# Patient Record
Sex: Male | Born: 1975 | Race: Black or African American | Hispanic: No | Marital: Married | Smoking: Former smoker
Health system: Southern US, Community
[De-identification: ages and names within clinical notes are randomized; demographics above are authoritative.]

## PROBLEM LIST (undated history)

## (undated) ENCOUNTER — Emergency Department (HOSPITAL_COMMUNITY): Admission: EM | Payer: 59 | Source: Home / Self Care

## (undated) DIAGNOSIS — Z8719 Personal history of other diseases of the digestive system: Secondary | ICD-10-CM

## (undated) DIAGNOSIS — I10 Essential (primary) hypertension: Secondary | ICD-10-CM

## (undated) DIAGNOSIS — R7303 Prediabetes: Secondary | ICD-10-CM

## (undated) DIAGNOSIS — R002 Palpitations: Secondary | ICD-10-CM

## (undated) DIAGNOSIS — K604 Rectal fistula, unspecified: Secondary | ICD-10-CM

## (undated) HISTORY — DX: Essential (primary) hypertension: I10

## (undated) HISTORY — PX: OTHER SURGICAL HISTORY: SHX169

## (undated) HISTORY — PX: CARDIAC CATHETERIZATION: SHX172

---

## 1998-07-03 ENCOUNTER — Emergency Department (HOSPITAL_COMMUNITY): Admission: EM | Admit: 1998-07-03 | Discharge: 1998-07-03 | Payer: Self-pay | Admitting: Emergency Medicine

## 2001-09-13 ENCOUNTER — Emergency Department (HOSPITAL_COMMUNITY): Admission: EM | Admit: 2001-09-13 | Discharge: 2001-09-13 | Payer: Self-pay | Admitting: Emergency Medicine

## 2002-01-11 ENCOUNTER — Ambulatory Visit (HOSPITAL_COMMUNITY): Admission: RE | Admit: 2002-01-11 | Discharge: 2002-01-11 | Payer: Self-pay | Admitting: Internal Medicine

## 2002-01-11 ENCOUNTER — Encounter: Payer: Self-pay | Admitting: Internal Medicine

## 2002-09-29 ENCOUNTER — Emergency Department (HOSPITAL_COMMUNITY): Admission: EM | Admit: 2002-09-29 | Discharge: 2002-09-30 | Payer: Self-pay | Admitting: Emergency Medicine

## 2002-09-29 ENCOUNTER — Encounter: Payer: Self-pay | Admitting: Emergency Medicine

## 2004-01-29 ENCOUNTER — Emergency Department (HOSPITAL_COMMUNITY): Admission: EM | Admit: 2004-01-29 | Discharge: 2004-01-29 | Payer: Self-pay | Admitting: Emergency Medicine

## 2004-04-16 ENCOUNTER — Emergency Department (HOSPITAL_COMMUNITY): Admission: EM | Admit: 2004-04-16 | Discharge: 2004-04-16 | Payer: Self-pay | Admitting: Emergency Medicine

## 2004-09-03 ENCOUNTER — Emergency Department (HOSPITAL_COMMUNITY): Admission: EM | Admit: 2004-09-03 | Discharge: 2004-09-03 | Payer: Self-pay | Admitting: Emergency Medicine

## 2006-01-16 ENCOUNTER — Emergency Department (HOSPITAL_COMMUNITY): Admission: EM | Admit: 2006-01-16 | Discharge: 2006-01-16 | Payer: Self-pay | Admitting: Emergency Medicine

## 2006-03-12 ENCOUNTER — Emergency Department (HOSPITAL_COMMUNITY): Admission: EM | Admit: 2006-03-12 | Discharge: 2006-03-12 | Payer: Self-pay | Admitting: Emergency Medicine

## 2006-04-04 ENCOUNTER — Emergency Department (HOSPITAL_COMMUNITY): Admission: EM | Admit: 2006-04-04 | Discharge: 2006-04-04 | Payer: Self-pay | Admitting: Emergency Medicine

## 2006-04-11 ENCOUNTER — Ambulatory Visit: Payer: Self-pay | Admitting: Orthopedic Surgery

## 2006-06-06 ENCOUNTER — Ambulatory Visit: Payer: Self-pay | Admitting: Orthopedic Surgery

## 2006-07-18 ENCOUNTER — Ambulatory Visit (HOSPITAL_COMMUNITY): Admission: RE | Admit: 2006-07-18 | Discharge: 2006-07-18 | Payer: Self-pay | Admitting: *Deleted

## 2006-07-20 ENCOUNTER — Ambulatory Visit (HOSPITAL_COMMUNITY): Admission: RE | Admit: 2006-07-20 | Discharge: 2006-07-20 | Payer: Self-pay | Admitting: *Deleted

## 2006-11-02 ENCOUNTER — Emergency Department (HOSPITAL_COMMUNITY): Admission: EM | Admit: 2006-11-02 | Discharge: 2006-11-02 | Payer: Self-pay | Admitting: Emergency Medicine

## 2007-10-05 ENCOUNTER — Emergency Department (HOSPITAL_COMMUNITY): Admission: EM | Admit: 2007-10-05 | Discharge: 2007-10-05 | Payer: Self-pay | Admitting: Emergency Medicine

## 2008-02-23 ENCOUNTER — Emergency Department (HOSPITAL_COMMUNITY): Admission: EM | Admit: 2008-02-23 | Discharge: 2008-02-23 | Payer: Self-pay | Admitting: Emergency Medicine

## 2009-01-31 ENCOUNTER — Emergency Department (HOSPITAL_COMMUNITY): Admission: EM | Admit: 2009-01-31 | Discharge: 2009-01-31 | Payer: Self-pay | Admitting: Emergency Medicine

## 2009-02-15 ENCOUNTER — Ambulatory Visit: Payer: Self-pay | Admitting: Family Medicine

## 2009-02-15 ENCOUNTER — Inpatient Hospital Stay (HOSPITAL_COMMUNITY): Admission: EM | Admit: 2009-02-15 | Discharge: 2009-02-16 | Payer: Self-pay | Admitting: Emergency Medicine

## 2009-02-16 ENCOUNTER — Ambulatory Visit: Payer: Self-pay | Admitting: Vascular Surgery

## 2009-02-16 ENCOUNTER — Encounter: Payer: Self-pay | Admitting: Family Medicine

## 2009-03-03 ENCOUNTER — Telehealth: Payer: Self-pay | Admitting: *Deleted

## 2009-03-10 ENCOUNTER — Encounter: Payer: Self-pay | Admitting: Family Medicine

## 2009-03-10 ENCOUNTER — Ambulatory Visit: Payer: Self-pay | Admitting: Family Medicine

## 2009-03-10 DIAGNOSIS — I10 Essential (primary) hypertension: Secondary | ICD-10-CM | POA: Insufficient documentation

## 2009-03-10 DIAGNOSIS — Z87891 Personal history of nicotine dependence: Secondary | ICD-10-CM | POA: Insufficient documentation

## 2009-03-21 ENCOUNTER — Encounter: Payer: Self-pay | Admitting: Family Medicine

## 2009-09-15 ENCOUNTER — Emergency Department (HOSPITAL_COMMUNITY): Admission: EM | Admit: 2009-09-15 | Discharge: 2009-09-15 | Payer: Self-pay | Admitting: Emergency Medicine

## 2009-10-09 ENCOUNTER — Telehealth: Payer: Self-pay | Admitting: *Deleted

## 2010-03-19 ENCOUNTER — Telehealth: Payer: Self-pay | Admitting: Family Medicine

## 2010-03-30 ENCOUNTER — Ambulatory Visit: Payer: Self-pay | Admitting: Family Medicine

## 2010-03-30 DIAGNOSIS — J301 Allergic rhinitis due to pollen: Secondary | ICD-10-CM | POA: Insufficient documentation

## 2010-04-08 ENCOUNTER — Emergency Department (HOSPITAL_COMMUNITY): Admission: EM | Admit: 2010-04-08 | Discharge: 2010-04-08 | Payer: Self-pay | Admitting: Emergency Medicine

## 2011-01-12 NOTE — Assessment & Plan Note (Signed)
Summary: allergies, HTN, stopped smoking!   Vital Signs:  Patient profile:   35 year old male Height:      72 inches Weight:      290.5 pounds BMI:     39.54 Temp:     98.2 degrees F oral Pulse rate:   66 / minute BP sitting:   145 / 86  (left arm) Cuff size:   regular  Vitals Entered By: Gladstone Pih (March 30, 2010 10:46 AM) CC: allergies, HTN, stopped smoking Is Patient Diabetic? No Pain Assessment Patient in pain? no        Primary Care Provider:  Jamie Brookes  CC:  allergies, HTN, and stopped smoking.  History of Present Illness: Allergies: Pt has been having a feeling of "being under water" with his hearing lately. He noticed it shortly after the pollen started to increase. He has a h/o sinus pressure the last 7 days and is wondering if this is b/c of allergies. He has a little cough and his throat seems to be draining. He is not currently taking anything for allergies.   Hypertension: PT has been doing well with his BP. He checks it at home occasionally and says the last time he checked it was 125/81 but that was about a month ago.  Smoking Cessation: Pt successfully quit smoking and has not restarted.   Habits & Providers  Alcohol-Tobacco-Diet     Tobacco Status: quit < 6 months     Tobacco Counseling: to remain off tobacco products  Current Medications (verified): 1)  Amlodipine Besylate 10 Mg Tabs (Amlodipine Besylate) .... Take 1 and 1/2 Tablets Daily 2)  Anacin 81 Mg Tbec (Aspirin) .... Take One Pill Daily 3)  Atenolol 50 Mg Tabs (Atenolol) .... Take One Pill Daily 4)  Trazodone Hcl 50 Mg Tabs (Trazodone Hcl) .... Take One Pill At Night 5)  Cetirizine Hcl 10 Mg Tabs (Cetirizine Hcl) .... Take 1 Pill Daily  Allergies (verified): No Known Drug Allergies  Review of Systems        vitals reviewed and pertinent negatives and positives seen in HPI   Physical Exam  General:  Well-developed,well-nourished,in no acute distress; alert,appropriate and  cooperative throughout examination Head:  Normocephalic and atraumatic without obvious abnormalities. No apparent alopecia or balding. Eyes:  No corneal or conjunctival inflammation noted. EOMI. Perrla. Funduscopic exam benign, without hemorrhages, exudates or papilledema. Vision grossly normal. Ears:  bilateral TM's bulging with clear fluid behind ears Nose:  nasal dischargemucosal pallor and mucosal edema.   Mouth:  pharyngeal erythema.   Neck:  No deformities, masses, or tenderness noted. Lungs:  Normal respiratory effort, chest expands symmetrically. Lungs are clear to auscultation, no crackles or wheezes. Heart:  Normal rate and regular rhythm. S1 and S2 normal without gallop, murmur, click, rub or other extra sounds.   Impression & Recommendations:  Problem # 1:  ALLERGIC RHINITIS DUE TO POLLEN (ICD-477.0) Assessment New PT started on Zyrtec. Given Rx today. Will likely improve his ear pressure, post nasal drip and cough.   Orders: [facility]- Est  Level 4 ([address])  Problem # 2:  ESSENTIAL HYPERTENSION, BENIGN (ICD-401.1) Assessment: Unchanged Pt is doing well with his hypertension. Will check a FLP in future. Encourged pt to cont to monitor.   His updated medication list for this problem includes:    Amlodipine Besylate 10 Mg Tabs (Amlodipine besylate) .Marland Kitchen... Take 1 and 1/2 tablets daily    Atenolol 50 Mg Tabs (Atenolol) .Marland Kitchen... Take one pill daily  Orders:  [facility]- Est  Level 4 (99214)Future Orders: Lipid-[facility] ([id number]) ... 03/18/2011 Comp Met-[facility] [phone]) ... 03/31/2011  Problem # 3:  TOBACCO ABUSE, HX OF (ICD-V15.82) Assessment: Improved PT has quit smoking. Encouraged to keep up the good work.   Orders: [facility]- Est  Level 4 (73710)  Complete Medication List: 1)  Amlodipine Besylate 10 Mg Tabs (Amlodipine besylate) .... Take 1 and 1/2 tablets daily 2)  Anacin 81 Mg Tbec (Aspirin) .... Take one pill daily 3)  Atenolol 50 Mg Tabs (Atenolol) .... Take one pill daily 4)   Trazodone Hcl 50 Mg Tabs (Trazodone hcl) .... Take one pill at night 5)  Cetirizine Hcl 10 Mg Tabs (Cetirizine hcl) .... Take 1 pill daily  Patient Instructions: 1)  Start generic Zyrtec for your allergies.  2)  You have flluid behind your hear drums.  3)  Try to increase activity to about 60 minutes a day.  4)  Increase fruits and vegetables.  5)  Good job on quitting smoking.  6)  Come back in 3 months to get fasting labs.  Prescriptions: CETIRIZINE HCL 10 MG TABS (CETIRIZINE HCL) take 1 pill daily  #96 x 3   Entered and Authorized by:   Jamie Brookes MD   Signed by:   Jamie Brookes MD on 03/30/2010   Method used:   Electronically to        [facility] [address] (retail)       [address]       [place], [place]  [id number]       Ph: [phone]       Fax: [phone]   RxID:   [id number] ATENOLOL 50 MG TABS (ATENOLOL) take one pill daily  #96 x 3   Entered and Authorized by:   Jamie Brookes MD   Signed by:   Jamie Brookes MD on 03/30/2010   Method used:   Electronically to        [facility] (retail)       [address]       [place], [place]  [address]       Ph: [phone]       Fax: [phone]   RxID:   [id number] AMLODIPINE BESYLATE 10 MG TABS (AMLODIPINE BESYLATE) take 1 and 1/2 tablets daily  #140 x 3   Entered and Authorized by:   Jamie Brookes MD   Signed by:   Jamie Brookes MD on 03/30/2010   Method used:   Electronically to        [facility] [facility] (retail)       [address]       [place], [place]  [address]       Ph: [phone]       Fax: [phone]   RxID:   [id number] ATENOLOL 50 MG TABS (ATENOLOL) take one pill daily  #96 x 3   Entered and Authorized by:   Jamie Brookes MD   Signed by:   Jamie Brookes MD on 03/30/2010   Method used:   Electronically to        [facility] [phone]* (retail)       [address]       [place], [place]  [address]       Ph:  [phone]       Fax: [phone]   RxID:   [id number]

## 2011-01-12 NOTE — Progress Notes (Signed)
Summary: Rx Req: Amlodipine to [facility] (not [facility])  Phone Note Refill Request Call back at [facility] Phone [phone] Message from:  Patient  Refills Requested: Medication #1:  AMLODIPINE BESYLATE 10 MG TABS take one pill daily You need to see your MD before any more refills can be sent. PT USES [facility].  PT HAS F/UP ON 03/30/10.  Initial call taken by: Clydell Hakim,  March 19, 2010 9:51 AM  Follow-up for Phone Call        to pcp Follow-up by: Golden Circle RN,  March 19, 2010 10:00 AM  Additional Follow-up for Phone Call Additional follow up Details #1::        sent refill to [facility]. Will send it again but to [facility] [address].  Additional Follow-up by: Jamie Brookes MD,  March 22, 2010 8:44 PM    Prescriptions: AMLODIPINE BESYLATE 10 MG TABS (AMLODIPINE BESYLATE) take one pill daily You need to see your MD before any more refills can be sent.  #34 x 0   Entered and Authorized by:   Jamie Brookes MD   Signed by:   Jamie Brookes MD on 03/22/2010   Method used:   Electronically to        [facility] (retail)       [address]       [place], [place]  [address]       Ph: [phone]       Fax: [phone]   RxID:   [id number]

## 2011-03-18 LAB — POCT CARDIAC MARKERS
CKMB, poc: 1.2 ng/mL (ref 1.0–8.0)
Myoglobin, poc: 82.8 ng/mL (ref 12–200)
Troponin i, poc: <0.05 ng/mL (ref 0.00–0.09)

## 2011-03-18 LAB — POCT I-STAT, CHEM 8
Chloride: 103 mEq/L (ref 96–112)
Creatinine, Ser: 0.9 mg/dL (ref 0.4–1.5)
Glucose, Bld: 114 mg/dL — ABNORMAL HIGH (ref 70–99)
HCT: 47 % (ref 39.0–52.0)
Hemoglobin: 16 g/dL (ref 13.0–17.0)
Potassium: 4.6 mEq/L (ref 3.5–5.1)
Sodium: 138 mEq/L (ref 135–145)

## 2011-03-25 LAB — DIFFERENTIAL
Eosinophils Absolute: 0.2 10*3/uL (ref 0.0–0.7)
Lymphocytes Relative: 22 % (ref 12–46)
Lymphs Abs: 1.7 10*3/uL (ref 0.7–4.0)
Monocytes Relative: 7 % (ref 3–12)

## 2011-03-25 LAB — COMPREHENSIVE METABOLIC PANEL
Alkaline Phosphatase: 59 U/L (ref 39–117)
BUN: 14 mg/dL (ref 6–23)
Calcium: 8.7 mg/dL (ref 8.4–10.5)
Chloride: 105 mEq/L (ref 96–112)
GFR calc Af Amer: >60 mL/min (ref >60)
Glucose, Bld: 117 mg/dL — ABNORMAL HIGH (ref 70–99)
Potassium: 3.2 mEq/L — ABNORMAL LOW (ref 3.5–5.1)
Total Bilirubin: 0.6 mg/dL (ref 0.3–1.2)

## 2011-03-25 LAB — HEMOGLOBIN A1C: Mean Plasma Glucose: 131 mg/dL

## 2011-03-25 LAB — POCT I-STAT, CHEM 8
Calcium, Ion: 1.14 mmol/L (ref 1.12–1.32)
Chloride: 104 mEq/L (ref 96–112)
Creatinine, Ser: 1.2 mg/dL (ref 0.4–1.5)
Glucose, Bld: 103 mg/dL — ABNORMAL HIGH (ref 70–99)
HCT: 47 % (ref 39.0–52.0)
Potassium: 4.3 mEq/L (ref 3.5–5.1)
Sodium: 139 mEq/L (ref 135–145)
TCO2: 29 mmol/L (ref 0–100)

## 2011-03-25 LAB — RAPID URINE DRUG SCREEN, HOSP PERFORMED
Barbiturates: NOT DETECTED
Opiates: NOT DETECTED
Tetrahydrocannabinol: NOT DETECTED

## 2011-03-25 LAB — LIPID PANEL
Cholesterol: 129 mg/dL (ref 0–200)
HDL: 54 mg/dL (ref >39)
LDL Cholesterol: 54 mg/dL (ref 0–99)
VLDL: 21 mg/dL (ref 0–40)

## 2011-03-25 LAB — CBC
RBC: 4.96 MIL/uL (ref 4.22–5.81)
WBC: 7.7 10*3/uL (ref 4.0–10.5)

## 2011-04-27 NOTE — Discharge Summary (Signed)
NAMEALAN, Martin Rose NO.:  [id number]   MEDICAL RECORD NO.:  [id number]          PATIENT TYPE:  INP   LOCATION:  4712                         FACILITY:  [facility]   PHYSICIAN:  Paula Compton, MD        DATE OF BIRTH:  [birth year]   DATE OF ADMISSION:  02/15/2009  DATE OF DISCHARGE:  02/16/2009                               DISCHARGE SUMMARY   PRIMARY CARE Willowdean Luhmann:  This patient was unassigned; however, he is  going to follow up with me for primary, Dr. Jamie Brookes, at [facility].   DISCHARGE DIAGNOSES:  1. Paresthesias.  2. Headaches.  3. Excess alcohol intake.  4. Tobacco abuse.  5. Insomnia.   Discharge medications for this patient include:  1. Aspirin 81 mg p.o. daily.  2. Atenolol 50 mg p.o. daily.  3. Amlodipine 10 mg p.o. daily.  4. Trazodone 50 mg p.o. nightly.   The amlodipine and trazodone are new prescriptions.  He was given 30  tablets of each.   Consults during this hospitalization were none.   IMAGING STUDIES DURING THIS HOSPITALIZATION:  The patient had a head CT  without contrast that was done on February 15, 2009, that showed no  intracranial hemorrhage or CT findings of large acute infarct.  The  patient also had a MRI/MRA of the plain head and neck.  The MRI of the  head showed no acute infarct, no intracranial mass or abnormal  enhancement, mild mucosal thickening of the ethmoid sinus air cells in  the maxillary sinuses, partial opacification of the mastoid air cell and  middle ears.  The patient had an MRA of the head that showed a left  tubal artery predominantly ends in a PICA.  The right PICA is not  visualized, diminutive size left AICA.  Otherwise, no medium or large  size vessel significant stenosis or occlusion.  The MRA of the neck  showed no evidence of hemodynamically significant stenosis involving  either carotid bifurcation.  His left vertebral artery may originate  from the aortic arch, minimal narrowing of  the proximal right vertebral  artery.  The patient also had an MR of the C-spine without contrast that  showed scattered mild degenerative changes superimposed on baseline  slightly narrowed spinal canal with findings most predominant at the C3-  C4 level as described.  The patient has mild bulging and mild spinal  stenosis at multiple levels predominately the C3-C4 level.  There is  some minimal foraminal narrowing, and at T2-T3 there is a tiny left  posterolateral foraminal protrusion.  The patient did have an  echocardiogram with the final result pending.  This will be something  that the PCP will need to follow up on as well as the final carotid  Doppler results that primarily showed no stenosis and the vertebral  arteries going anteriorly.  These were both done on February 16, 2009, just  prior to the patient's discharge.  Significant lab results include a  mildly elevated glucose on i-STAT Chem-8.  Right before admission on  February 15, 2009, his glucose of  103.  The patient also had a CBC with  differential that was completely normal.  He had an echo alcohol level  that was less than 5.  He had an urine drug screen that was negative and  a complete metabolic panel that did show some low potassium at 3.2, a  glucose of 117, and albumin of 3.4.  The patient's potassium was  repleted during this hospitalization.  He had a lipid profile done on  February 16, 2009, that showed a cholesterol of 129, triglycerides 104, HDL  54, and LDL 54.  The patient also had ESR that was 0.  He had TSH level  that was 5.434 showing that his TSH was mildly elevated.  This will be  something that his PCP will need to follow up on.  The patient also had  a hemoglobin A1c that was done and was shown to be mildly elevated at  6.2.  this is considered prediabetes stat according to the newest  guidelines and this will be something that the PCP will need to talk to  the patient about as well.   BRIEF HOSPITAL COURSE:   This is a 35 year old male with a history of  hypertension, obesity, and probable obstructive sleep apnea who  presented to the ED with an episode of weakness and tingling in his left  leg, his left arm, and his left hand.  The patient was evaluated in the  ED, and a workup was begun to rule out transient ischemic attack or CVA.  The patient did complete an EKG which was normal.  He did have a CT scan  that showed no acute hemorrhage, but could not rule out small vessel  infarct, so the patient had an MRI/MRA done of the carotids and C-spine  and the brain and the results are as noted above.  The patient also had  a 2-D echo as well as carotid Dopplers to continue to rule out the  patient.  1. The first and most significant issue was the numbness and tingling      of the left arm and leg and paresthesias.  They are an unusual      atypical presentation for TIA or CVA, but the patient has some risk      factors including obesity, smoking, hypertension, family history,      and male sex, so the patient was ruled out with multiple tests.  He      was found to be negative for signs of TIA or stroke on each test.      The only thing that did prevent was compression and some bulging of      his C-spine which could be causing nerve compression peripherally      or radiculopathy.  The patient did mention that when he sits on his      wallet he does also have more numbness and tingling in his lower      leg consistent with nerve impingement.  Since the patient does have      hypertension, he was not treated aggressively until his CVA was      ruled out.  The patient did continue to have a little bit of high      blood pressures up to 160s during this hospitalization, so his      Norvasc was increased from 5 mg p.o. daily to 10 mg p.o. daily and      his atenolol was less than 50 mg p.o. daily.  The patient's blood      pressure will need to be monitored on subsequent visit with his      primary  care physician.  The patient's cholesterol was low and not      concerning at this time.  2. Headaches.  The patient has daily headaches and this is likely due      to obstructive sleep apnea.  The patient has tried to have a sleep      study twice in the past, but was unable to fall asleep, so the      sleep study has never been able to show the conclusive cause of his      insomnia and headaches.  The headaches are likely due to the      patient drinking alcohol nightly in order to sleep.  He does      mention that he drinks beer and 2-3 shots of alcohol at night in      order to sleep.  The patient was treated with Ambien while he was      in the hospital.  He was sent home with a prescription for      trazodone for sleep.  This would be evaluated at the next visit and      possibly a subsequent visit sleep study can be set up.  3. Alcohol intake.  The patient has excessive alcohol intake, but his      alcohol level during this hospitalization was less than 5.  We      treated his insomnia with Ambien, and the patient did not have any      withdrawal or delirium tremens during this hospitalization.  4. Smoking.  The patient does smoke cigarettes.  He is motivated to      quit.  He did have a smoking cessation consult while he was in the      hospital.  This will be something that we will follow up as an      outpatient.  5. Insomnia.  The patient does have a history of insomnia, and the      patient was treated with Ambien during the hospitalization and sent      home with a prescription for trazodone as well as detailed      information about good sleep hygiene including not watching      television 2 hours before bed, using caramel tea, using melatonin      as well as using the day room only for sleep and not to watch TV or      do other activities.   Issues to be followed up that his next appointment are blood pressure  checking, checking on his smoking cessation, as well as  following up on  his hemoglobin A1c that was in the prediabetic range at 6.1, following  up on his TSH that  was mildly elevated as well as following up on the results from his echo  and Doppler that were done in the hospital prior to discharge.  The  patient will call the clinic and make an appointment to follow up with  Dr. Clotilde Dieter in 2-4 weeks' time.  The patient was sent home in stable  medical condition.      Jamie Brookes, MD  Electronically Signed      Paula Compton, MD  Electronically Signed    AS/MEDQ  D:  02/16/2009  T:  02/17/2009  Job:  [id number]

## 2011-04-27 NOTE — H&P (Signed)
Martin Rose, Martin Rose NO.:  [id number]   MEDICAL RECORD NO.:  [id number]          PATIENT TYPE:  INP   LOCATION:  4712                         FACILITY:  [facility]   PHYSICIAN:  Milinda Antis, MD     DATE OF BIRTH:  January 19, 1976   DATE OF ADMISSION:  02/15/2009  DATE OF DISCHARGE:                              HISTORY & PHYSICAL   PRIMARY CARE Raniah Karan:  Unassigned.   PRIMARY CARDIOLOGIST:  Dani Gobble, MD, [facility].   CHIEF COMPLAINT:  Tingling in arm and leg.   HISTORY OF PRESENT ILLNESS:  A 35 year old male with history of  hypertension, obesity, probable OSA, presents with episode of weakness  and tingling in left arm and leg.  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  He states after lunch around  3:15 p.m. became lightheaded proceeded with driving route and developed  tingling, which started worse in left foot and toes and then in left  arm.  Left arm tingling turned into numbness, both lasting approximately  2 hours and the patient arrived back at furniture store.  Upon exiting  truck, the patient stated he felt generalized weakness and jitteriness,  but tingling had resolved.  Episode not associated with nausea,  vomiting, loss of consciousness, trauma, chest pain, shortness of  breath, injury, or alcohol intake.  Per the patient, he has had episodes  of tingling before in lower extremities, which he contributes to driving  long hours and sitting on his wallet.  The patient also admits to daily  headaches and occasional palpitation.  They are not directly associated  with paresthesia episodes.   PAST MEDICAL HISTORY:  1. Hypertension.  2. Cardiac catheterization in 2007, EF of 60%, no vessel disease.   PAST SURGICAL HISTORY:  Cardiac cath in 2007.   ALLERGIES:  No known drug allergies.   MEDICATIONS:  1. Atenolol 50 mg p.o. daily.  2. Norvasc 5 mg p.o. daily.   FAMILY HISTORY:  Mother with CVA at age  75 and hypertension.  Grandmother had breast cancer, colon cancer in family.   SOCIAL HISTORY:  Positive for tobacco, 1-2 cigarettes per day for  greater than 10 years; illicit, denies; EtOH, 2-3 beers and a couple of  shots every night prior to bedtime; lives with partner; 3 children; and  works for [employer] as a Naval architect.   REVIEW OF SYSTEMS:  No abdominal pain.  Recent bronchitis, treated in  ER, insomnia positive, and daily headaches.  No nausea or vomiting.  No  recent injury or trauma.  Positive palpitations and fluttering which  occur randomly during the day.  All other per HPI.   PHYSICAL EXAMINATION:  VITAL SIGNS:  Temperature 98.8, heart rate 80,  respiratory rate 16-20, blood pressure 179/114 range, systolic 154-179  over diastolic 100-114, and O2 sat 100% on room air.  GENERAL:  No acute distress, alert, and oriented x3.  Crying after exam  during discussion of admission.  HEENT:  PERRL, EOMI, nonicteric.  Oropharynx is clear.  Moist mucous  membranes.  NECK:  Supple.  No lymphadenopathy.  Nontender.  Normal range of motion.  CVS:  Regular rate and rhythm.  No murmurs.  RESPIRATORY:  CTAP.  ABDOMEN:  Positive bowel signs.  Normoactive, nontender, and  nondistended.  No masses.  Obese.  EXTREMITIES:  No edema.  Pulses are 2+.  NEUROLOGIC:  Cranial nerves II through XII grossly intact.  Motor 5/5  bilaterally.  Sensation is grossly intact.  Gait is normal.  No pronator  drift.  No clonus.  Reflexes are equal bilaterally.  GCS 15.  Speech  normal, answers appropriately, recall normal.   LABORATORY DATA:  WBC 7.7, hemoglobin 14.3, hematocrit 41.5, and  platelets 253.  Differential within normal limits.  I-STAT; sodium 139,  potassium 4.3, chloride 104, BUN 18, creatinine 1.2, glucose 110,  hemoglobin on I-STAT 16, hematocrit 47, and pCO2 29.   IMAGING:  CT of head without contrast, impression:  No intracranial  hemorrhage or CT findings of large acute infarct.   Small acute infarct  cannot be excluded by CT.  EKG:  Normal sinus rhythm; T-wave  abnormality, diffusely; inverted T-waves, normal compared to April 2007.   ASSESSMENT AND PLAN:  A 35 year old male with recent paresthesia in  upper and lower extremity and generalized weakness.  1. Paresthesia.  The patient is with history of lower extremity      tingling, however, with both upper extremity numbness, headache,      and lightheadedness as well as risk factors, concerned for      cerebrovascular event.  CT scan shows no evidence of acute bleed or      large infarct, however, cannot rule out small infarct.  We will      obtain MRI/MRA of brain and carotids and extend to C-spine to      evaluate for infarct, occlusion of large vessels, or any spinal      nerve impingement.  We will obtain 2-D echo to evaluate for thrombi      or abnormal wall motion movement.  We will admit to telemetry with      q.4 h. neuro checks to observe for any arrhythmia or change in      neurological exam.  We will risk stratify with history of      hypertension, obesity, and family history of CVA with lipid panel      and hemoglobin A1c.  We will start low-dose aspirin to follow TIA      protocol, hold on anticoagulation for 24 hours.  The patient with      elevated blood pressures into 170 systolic; however, will allow for      permissive hypertension until imaging clear.  Labetalol will be      used p.r.n. blood pressure greater than 220/120.  Other      differentials with lower extremity tingling include sciatica or      nerve impingement with long hours of furniture moving.      Differentials for daily headaches include OSA or effects of daily      EtOH use.  2. Hypertension.  Elevated blood pressure, however, rule out for      permissive hypertension until images rule out acute infarct.  We      will start home meds in a.m. and titrate as needed.  3. Ethyl alcohol dependence.  We will obtain UDS and EtOH  level,      monitor for signs of withdrawal.  Per the patient, he drinks      nightly secondary to  insomnia.  4. Tobacco dependence, we will counsel smoking cessation.  5. Insomnia.  We will give Ambien p.r.n.  6. Prophylaxis.  Protonix.  Hold anticoagulation for first 24 hours.  7. Disposition.  Pending TIA workup.  We will need primary care      Murrel Freet at discharge.      Milinda Antis, MD  Electronically Signed     KD/MEDQ  D:  02/16/2009  T:  02/16/2009  Job:  [id number]

## 2011-04-30 NOTE — Cardiovascular Report (Signed)
NAMEMarland Kitchen  Martin Rose, Martin Rose NO.:  [id number]   MEDICAL RECORD NO.:  [id number]          PATIENT TYPE:  OIB   LOCATION:  2899                         FACILITY:  [facility]   PHYSICIAN:  Darlin Priestly, MD  DATE OF BIRTH:  Apr 24, 1976   DATE OF PROCEDURE:  07/20/2006  DATE OF DISCHARGE:                              CARDIAC CATHETERIZATION   INDICATIONS FOR PROCEDURE:  [name] is a 35 year old male patient of  Dr. Kem Boroughs and Dr. Laverle Hobby, [facility], with a history of hypertension. He did undergo a 2-D echocardiogram  revealing LVH with hyperdynamic LV with mild MR. He also underwent a  Cardiolite scan suggesting anterior wall ischemia with a normal EF. Because  of his abnormal Cardiolite, he is now referred for cardiac catheterization  to rule out significant CAD.   DESCRIPTION OF PROCEDURE:  After getting informed consent, the patient was  brought to the cardiac cath lab, right and left groin shaved, prepped and  draped in the usual sterile fashion. ECG monitor established. Using modified  Seldinger technique, a 6 French arterial sheath was inserted in the right  femoral artery. A 6 French diagnostic catheter was used to perform  diagnostic angiography.   The left main is a large vessel with no evidence of disease.   The LAD is a large vessel that courses to the apex of three diagonal  branches. The LAD has no evidence of disease.   The first diagonal was a medium size vessel which bifurcates distally with  no evidence of disease.   The second and third diagonals were small vessels with no evidence  disease.   The left circumflex was a medium size vessel with gave rise to one obtuse  marginal branch. There is no evidence of disease in the AV circumflex.   The first OM-1 was a medium size vessel with no evidence of disease.   The coronary artery was a large vessel which is dominant to the PDA and  posterolateral branch.  There is no evidence of disease in the RCA, PDA or  posterolateral branch.   Left ventriculogram reveals a preserved EF of 60%.   Abdominal aortogram reveals no evidence of significant renal artery  stenosis.   HEMODYNAMICS:  Systemic aortic pressure 138/93, LV systemic pressure 139/2,  LVEDP of 22.   CONCLUSION:  1. No significant coronary artery disease.  2. Normal LV systolic function.  3. No evidence of renal artery stenosis.  4. Systemic hypertension.  5. Elevated LVEDP.      Darlin Priestly, MD  Electronically Signed     RHM/MEDQ  D:  07/20/2006  T:  07/20/2006  Job:  [id number]   cc:   Dr. Rickard Rhymes, M.D.

## 2011-06-15 ENCOUNTER — Emergency Department (HOSPITAL_COMMUNITY)
Admission: EM | Admit: 2011-06-15 | Discharge: 2011-06-15 | Disposition: A | Discharge status: Home / Self Care | Payer: Self-pay | Attending: Emergency Medicine | Admitting: Emergency Medicine

## 2011-06-15 DIAGNOSIS — I10 Essential (primary) hypertension: Secondary | ICD-10-CM | POA: Insufficient documentation

## 2011-06-15 DIAGNOSIS — Z79899 Other long term (current) drug therapy: Secondary | ICD-10-CM | POA: Insufficient documentation

## 2011-06-15 DIAGNOSIS — M79609 Pain in unspecified limb: Secondary | ICD-10-CM | POA: Insufficient documentation

## 2011-06-15 DIAGNOSIS — L03019 Cellulitis of unspecified finger: Secondary | ICD-10-CM | POA: Insufficient documentation

## 2011-06-17 ENCOUNTER — Other Ambulatory Visit: Payer: Self-pay | Admitting: Family Medicine

## 2011-06-23 ENCOUNTER — Ambulatory Visit (INDEPENDENT_AMBULATORY_CARE_PROVIDER_SITE_OTHER): Payer: Self-pay | Admitting: Family Medicine

## 2011-06-23 VITALS — BP 137/86 | HR 60 | Temp 98.0°F | Wt 290.0 lb

## 2011-06-23 DIAGNOSIS — I10 Essential (primary) hypertension: Secondary | ICD-10-CM

## 2011-06-23 DIAGNOSIS — IMO0002 Reserved for concepts with insufficient information to code with codable children: Secondary | ICD-10-CM

## 2011-06-23 LAB — COMPREHENSIVE METABOLIC PANEL
ALT: 33 U/L (ref 0–53)
AST: 24 U/L (ref 0–37)
Albumin: 4.1 g/dL (ref 3.5–5.2)
Alkaline Phosphatase: 87 U/L (ref 39–117)
BUN: 14 mg/dL (ref 6–23)
Potassium: 4.2 mEq/L (ref 3.5–5.3)
Sodium: 138 mEq/L (ref 135–145)
Total Protein: 6.8 g/dL (ref 6.0–8.3)

## 2011-06-23 MED ORDER — AMLODIPINE BESYLATE 10 MG PO TABS: ORAL_TABLET | ORAL | Status: DC

## 2011-06-23 MED ORDER — ATENOLOL 50 MG PO TABS: 50.0000 mg | ORAL_TABLET | Freq: Every day | ORAL | Status: DC

## 2011-06-23 NOTE — Patient Instructions (Signed)
Paronychia (Felon, Whitlow) Paronychia is an inflammatory reaction involving the folds of the skin surrounding the fingernail. This is commonly caused by an infection in the skin around a nail. The most common cause of paronychia is frequent wetting of the hands (as seen with bartenders, food servers, nurses or others who wet their hands). This makes the skin around the fingernail susceptible to infection by bacteria (germs) or fungus. Other predisposing factors are:  Aggressive manicuring.   Nail biting.  Thumbsucking. Alcohol and Nutrition Nutrition serves two purposes. It provides energy. It also maintains body structure and function. Food supplies energy. It also provides the building blocks needed to replace worn or damaged cells. Alcoholics often eat poorly. This limits their supply of essential nutrients. This affects energy supply and structure maintenance. Alcohol also affects the body's nutrients in:  Digestion.   Storage.   Using and getting rid of waste products.  IMPAIRMENT OF NUTRIENT DIGESTION AND UTILIZATION   Once ingested, food must be broken down into small components (digested). Then it is available for energy. It helps maintain body structure and function. Digestion begins in the mouth. It continues in the stomach and intestines, with help from the pancreas. The nutrients from digested food are absorbed from the intestines into the blood. Then they are carried to the liver. The liver prepares nutrients for:   Immediate use.   Storage and future use.   Alcohol inhibits the breakdown of nutrients into usable molecules.   It decreases secretion of digestive enzymes from the pancreas.   Alcohol impairs nutrient absorption by damaging the cells lining the stomach and intestines.   It also interferes with moving some nutrients into the blood.   In addition, nutritional deficiencies themselves may lead to further absorption problems.   For example, folate deficiency  changes the cells that line the small intestine. This impairs how water is absorbed. It also affects absorbed nutrients. These include glucose, sodium, and additional folate.   Even if nutrients are digested and absorbed, alcohol can prevent them from being fully used. It changes their transport, storage, and excretion. Impaired utilization of nutrients by alcoholics is indicated by:   Decreased liver stores of vitamins such as vitamin A   Increased excretion of nutrients such as fat.  ALCOHOL AND ENERGY SUPPLY   Three basic nutritional components found in food are:   Carbohydrates.   Proteins.   Fats.   These are used as energy. Some alcoholics take in as much as 50 % of their total daily calories from alcohol. They often neglect important foods.   Even when enough food is eaten, alcohol can impair the ways the body controls blood sugar (glucose) levels. It may either increase or decrease blood sugar.   In non-diabetic alcoholics, increased blood sugar (hyperglycemia) is caused by poor insulin secretion. It is usually temporary.   Decreased blood sugar (hypoglycemia) can cause serious injury even if this condition is short lived. Low blood sugar can happen when a fasting or malnourished person drinks alcohol. When there is no food to supply energy, stored sugar is used up. The products of alcohol inhibit forming glucose from other compounds such as amino acids. As a result, alcohol causes the brain and other body tissue to lack glucose. It is needed for energy and function.   Alcohol is an energy source. But how the body processes and uses the energy from alcohol is complex. Also, when alcohol is substituted for carbohydrates, subjects tend to lose weight. This indicates that  they get less energy from alcohol than from food.  ALCOHOL - MAINTAINING CELL STRUCTURE AND FUNCTION  Structure  Cells are made mostly of protein. So an adequate protein diet is important for maintaining cell  structure. This is especially true if cells are being damaged. Research indicates that alcohol affects protein nutrition by causing impaired:  Digestion of proteins to amino acids.   Processing of amino acids by the small intestine and liver.   Synthesis of proteins from amino acids.  Protein secretion by the liver.   Function  Nutrients are essential for the body to function well. They provide the tools that the body needs to work well:   Proteins.   Vitamins.   Minerals.  Alcohol can disrupt body function. It may cause nutrient deficiencies. And it may interfere with the way nutrients are processed. Vitamins   Vitamins are essential to maintain growth and normal metabolism. They regulate many of the body`s processes. Chronic heavy drinking causes deficiencies in many vitamins. This is caused by eating less. And, in some cases, vitamins may be poorly absorbed. For example, alcohol inhibits fat absorption. It impairs how the vitamins A, E, and D are normally absorbed along with dietary fats. Not enough vitamin A may cause night blindness. Not enough vitamin D may cause softening of the bones.   Some alcoholics lack vitamins A, C, D, E, K, and the B vitamins. These are all involved in wound healing and cell maintenance. In particular, because vitamin K is necessary for blood clotting, lacking that vitamin can cause delayed clotting. The result is excess bleeding. Lacking other vitamins involved in brain function may cause severe neurological damage.  Minerals  Deficiencies of minerals such as calcium, magnesium, iron, and zinc are common in alcoholics. The alcohol itself does not seem to affect how these minerals are absorbed. Rather, they seem to occur secondary to other alcohol-related problems, such as:  Less calcium absorbed.   Not enough magnesium.   More urinary excretion.   Vomiting.   Diarrhea.   Not enough iron due to gastrointestinal bleeding.   Not enough zinc or losses  related to other nutrient deficiencies.   Mineral deficiencies can cause a variety of medical consequences. These range from calcium-related bone disease to zinc-related night blindness and skin lesions.  ALCOHOL, MALNUTRITION, AND MEDICAL COMPLICATIONS  Liver Disease   Alcoholic liver damage is caused primarily by alcohol itself. But poor nutrition may increase the risk of alcohol-related liver damage. For example, nutrients normally found in the liver are known to be affected by drinking alcohol. These include carotenoids, which are the major sources of vitamin A, and vitamin E compounds. Decreases in such nutrients may play some role in alcohol-related liver damage.  Pancreatitis   Research suggests that malnutrition may increase the risk of developing alcoholic pancreatitis. Research suggests that a diet lacking in protein may increase alcohol's damaging effect on the pancreas.  Brain   Nutritional deficiencies may have severe effects on brain function. These may be permanent Specifically, thiamine deficiencies are often seen in alcoholics. They can cause severe neurological problems. These include:   Impaired movement.   Memory loss seen in Wernicke/Korsakoff syndrome.  Pregnancy   Alcohol has toxic effects on fetal development. It causes alcohol-related birth defects. They include fetal alcohol syndrome. Alcohol itself is toxic to the fetus. Also, the nutritional deficiency can affect how the fetus develops. That may compound the risk of developmental damage.   Nutritional needs during pregnancy are 10 to 30 %  greater than normal. Food intake can increase by as much as 140 % to cover the needs of both mother and fetus. An alcoholic mother`s nutritional problems may adversely affect the nutrition of the fetus. And alcohol itself can also restrict nutrition flow to the fetus.  NUTRITIONAL STATUS OF ALCOHOLICS  Techniques for assessing nutritional status include:  Taking body measurements  to estimate fat reserves. They include:   Weight.  Height.   Mass.  Skin fold thickness.   Performing blood analysis to provide measurements of circulating:  Proteins.   Vitamins.   Minerals.   These techniques tend to be imprecise. For many nutrients, there is no clear "cut-off" point that would allow an accurate definition of deficiency. So assessing the nutritional status of alcoholics is limited by these techniques. Dietary status may provide information about the risk of developing nutritional problems. Dietary status is assessed by:   Taking patients' dietary histories.   Evaluating the amount and types of food they are eating.   It is difficult to determine what exact amount of alcohol begins to have damaging effects on nutrition. In general, moderate drinkers have two drinks or less per day. They seem to be at little risk for nutritional problems. Various medical disorders begin to appear at greater levels.   Research indicates that the majority of even the heaviest drinkers have few obvious nutritional deficiencies. Many alcoholics who are hospitalized for medical complications of their disease do have severe malnutrition. Alcoholics tend to eat poorly. Often they eat less than the amounts of food necessary to provide enough:   Carbohydrates.  Protein.   Fat.  Vitamins A and C.   B vitamins.  Minerals like calcium and iron.   Of major concern is alcohol's effect on digesting food and use of nutrients. It may shift a mildly malnourished person toward severe malnutrition. Document Released: 09/23/2005 Document Re-Released: 05/19/2010  [facility] Patient Information 2011 [place], [place]. The most common cause is a staphylococcal (a type of germ) infection, or a fungal (Candida) infection. When caused by a germ, it usually comes on suddenly with redness, swelling, pus and is often painful. It may get under the nail and form an abscess (collection of pus), or form an abscess  around the nail. If the nail itself is infected with a fungus, the treatment is usually prolonged and may require oral medicine for up to one year. Your caregiver will determine the length of time treatment is required. The paronychia caused by bacteria (germs) may largely be avoided by not pulling on hangnails or picking at cuticles. When the infection occurs at the tips of the finger it is called felon. When the cause of paronychia is from the herpes simplex virus (HSV) it is called herpetic whitlow. TREATMENT  When an abscess is present treatment is often incision and drainage. This means that the abscess must be cut open so the pus can get out. When this is done, the following home care instructions should be followed.  HOME CARE INSTRUCTIONS  It is important to keep the affected fingers very dry. Rubber or plastic gloves over cotton gloves should be used whenever the hand must be placed in water.   Keep wound clean, dry and dressed as suggested by your caregiver between warm soaks or warm compresses.   Soak in warm water for fifteen to twenty minutes three to four times per day for bacterial infections. Fungal infections are very difficult to treat, so often require treatment for long periods of time.  For bacterial (germ) infections take antibiotics (medicine which kill germs) as directed and finish the prescription, even if the problem appears to be solved before the medicine is gone.   Only take over-the-counter or prescription medicines for pain, discomfort, or fever as directed by your caregiver.  SEEK IMMEDIATE MEDICAL CARE IF :  There is redness, swelling, or increasing pain in the wound.   Pus is coming from wound.   An unexplained oral temperature above 100.5 develops.   You notice a foul smell coming from the wound or dressing.  Document Released: 05/25/2001 Document Re-Released: 02/25/2009 [facility] Patient Information 2011 [place], [place].

## 2011-06-23 NOTE — Progress Notes (Signed)
  Subjective:    Patient ID: Martin Rose, male    DOB: 05-07-1976, 35 y.o.   MRN: [id number]  HPI Micah Flesher to hospital 7/3 for infected nail. Had nail removed and placed on abx x 2 days. Nail swelling and drainage much improved since removal. No fevers, hand swelling. Pt is here for follow up.   HTN: Pt reports hx/o elevated BPs in the past. Noted to be in 160s previously. Pt was placed on norvasc and atenolol in the past. Pt has been out of medications for last 3 months. Pt reports HA assd w/ Bps >160. Otherwise no CP, SOB. Has also been trying to decrease salt intake.    Review of Systems See HPI, otherwise 12 point ROS negative.     Objective:   Physical Exam Gen: up in chair, NAD, obese HEENT: NCAT, EOMI, TMs clear bilaterally CV: RRR, no murmurs auscultated PULM: CTAB, no wheezes, rales, rhoncii ABD: S/NT/+ bowel sounds  EXT: 2+ peripheral pulses         Assessment & Plan:

## 2011-06-24 ENCOUNTER — Encounter: Payer: Self-pay | Admitting: Family Medicine

## 2011-06-27 DIAGNOSIS — IMO0002 Reserved for concepts with insufficient information to code with codable children: Secondary | ICD-10-CM | POA: Insufficient documentation

## 2011-06-27 NOTE — Assessment & Plan Note (Signed)
Will continue with current regimen. Will check Cr and K.

## 2011-06-27 NOTE — Assessment & Plan Note (Signed)
Clinically resloved s/p abx.

## 2011-08-19 ENCOUNTER — Ambulatory Visit: Payer: Self-pay | Admitting: Family Medicine

## 2011-09-22 LAB — URINALYSIS, ROUTINE W REFLEX MICROSCOPIC
Glucose, UA: NEGATIVE
Hgb urine dipstick: NEGATIVE
Protein, ur: NEGATIVE

## 2011-11-23 ENCOUNTER — Ambulatory Visit: Payer: Self-pay | Admitting: Family Medicine

## 2011-11-24 ENCOUNTER — Ambulatory Visit (INDEPENDENT_AMBULATORY_CARE_PROVIDER_SITE_OTHER): Payer: Self-pay | Admitting: Family Medicine

## 2011-11-24 ENCOUNTER — Telehealth: Payer: Self-pay | Admitting: *Deleted

## 2011-11-24 ENCOUNTER — Encounter: Payer: Self-pay | Admitting: Family Medicine

## 2011-11-24 VITALS — BP 130/85 | HR 62 | Temp 97.8°F | Ht 72.0 in | Wt 303.0 lb

## 2011-11-24 DIAGNOSIS — R600 Localized edema: Secondary | ICD-10-CM | POA: Insufficient documentation

## 2011-11-24 DIAGNOSIS — R609 Edema, unspecified: Secondary | ICD-10-CM

## 2011-11-24 DIAGNOSIS — Z23 Encounter for immunization: Secondary | ICD-10-CM

## 2011-11-24 DIAGNOSIS — I10 Essential (primary) hypertension: Secondary | ICD-10-CM

## 2011-11-24 LAB — POCT URINALYSIS DIPSTICK
Bilirubin, UA: NEGATIVE
Ketones, UA: NEGATIVE
Leukocytes, UA: NEGATIVE
pH, UA: 6

## 2011-11-24 NOTE — Progress Notes (Signed)
  Subjective:    Patient ID: Martin Rose, male    DOB: 1975/12/28, 35 y.o.   MRN: [id number]  HPI Lower extremity edema: Patient reports lower extremity edema bilateral x3 weeks.  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 Swelling in extremities has never been a problem prior to the past 3 weeks. Takes Norvasc and propranolol for blood pressure. Takes as directed. Has never had swelling on Norvasc and has taken Norvasc x5 years. Was out of medications x3 months earlier this year but other than that has taken medications consistently. Patient does endorse significant alcohol use. Has one to 2 beers per night in 1-2 shots of vodka per night. Drinks approximately a pint  per weekend.  Patient states he drinks less now in the past. Does not consider his drinking problem. Does have family that is concerned about his drinking. Does not need "eye-opener."    Review of Systems As per above. No leg pain. No calf pain. No numbness or tingling. No joint or leg redness. No skin changes.no shortness of breath. No chest pain. Occasional palpitations.     Objective:   Physical Exam  Constitutional: He is oriented to person, place, and time. He appears well-developed and well-nourished.  HENT:  Head: Normocephalic and atraumatic.  Eyes: Pupils are equal, round, and reactive to light.  Neck: Normal range of motion.  Cardiovascular: Normal rate, regular rhythm and normal heart sounds.   No murmur heard. Pulmonary/Chest: Effort normal. No respiratory distress. He has no wheezes.       No crackles on lung exam  Abdominal: Soft. He exhibits no distension. There is no tenderness.  Musculoskeletal: He exhibits edema (1+ edema bilateral- to mid skin.  no calf pain.  no lower leg redness or skin changes. ).  Neurological: He is alert and oriented to person, place, and time.  Psychiatric: He has a normal mood and affect. His behavior is normal.            Assessment & Plan:

## 2011-11-24 NOTE — Assessment & Plan Note (Signed)
Will obtain echocardiogram in order to evaluate heart. I would like to rule out alcoholic cardiomyopathy in the setting of heavy alcohol use. Lower extremity  edema may be secondary to Norvasc use-although patient has been on this for possibly 5 years consistently has never had the side effect in the past. 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 Will first look at echo and then continue workup depending on the results. Patient to follow-up with PCP--return sooner if any new or worsening of symptoms

## 2011-11-24 NOTE — Telephone Encounter (Signed)
Lvm,Pt has an appt with [facility] on 12.17.2012 @ 945 am. Pt to report to 1st floor admitting. Laureen Ochs, Viann Shove

## 2011-11-29 ENCOUNTER — Ambulatory Visit (HOSPITAL_COMMUNITY)
Admission: RE | Admit: 2011-11-29 | Discharge: 2011-11-29 | Disposition: A | Discharge status: Home / Self Care | Payer: Self-pay | Source: Ambulatory Visit | Attending: Family Medicine | Admitting: Family Medicine

## 2011-11-29 ENCOUNTER — Encounter: Payer: Self-pay | Admitting: Family Medicine

## 2011-11-29 ENCOUNTER — Telehealth: Payer: Self-pay | Admitting: Family Medicine

## 2011-11-29 DIAGNOSIS — I059 Rheumatic mitral valve disease, unspecified: Secondary | ICD-10-CM | POA: Insufficient documentation

## 2011-11-29 DIAGNOSIS — R609 Edema, unspecified: Secondary | ICD-10-CM | POA: Insufficient documentation

## 2011-11-29 DIAGNOSIS — R600 Localized edema: Secondary | ICD-10-CM

## 2011-11-29 DIAGNOSIS — I517 Cardiomegaly: Secondary | ICD-10-CM

## 2011-11-29 DIAGNOSIS — I379 Nonrheumatic pulmonary valve disorder, unspecified: Secondary | ICD-10-CM | POA: Insufficient documentation

## 2011-11-29 DIAGNOSIS — I10 Essential (primary) hypertension: Secondary | ICD-10-CM | POA: Insufficient documentation

## 2011-11-29 HISTORY — PX: TRANSTHORACIC ECHOCARDIOGRAM: SHX275

## 2011-11-29 NOTE — Telephone Encounter (Signed)
Call patient to review results of echocardiogram. Echocardiogram showed diastolic dysfunction grade 2 this is secondary to hypertension. Does not explain lower extremity edema. Started patient to return in the next couple of weeks. We will check a complete metabolic panel to rule out liver or renal problem. Patient to use compression hose until followup appointment. Patient states that blood pressure has been elevated. Patient to write down blood pressures and bring a log to appointment. Most likely we'll need to start another agent-most likely ACE inhibitor in the setting of diastolic dysfunction.

## 2011-11-29 NOTE — Progress Notes (Signed)
  Echocardiogram 2D Echocardiogram has been performed.  Martin Rose [facility] 11/29/2011, 11:26 AM

## 2011-12-08 ENCOUNTER — Ambulatory Visit: Payer: Self-pay | Admitting: Family Medicine

## 2012-02-14 ENCOUNTER — Encounter: Payer: Self-pay | Admitting: Family Medicine

## 2012-02-14 ENCOUNTER — Ambulatory Visit (INDEPENDENT_AMBULATORY_CARE_PROVIDER_SITE_OTHER): Payer: Self-pay | Admitting: Family Medicine

## 2012-02-14 ENCOUNTER — Other Ambulatory Visit: Payer: Self-pay | Admitting: Family Medicine

## 2012-02-14 VITALS — BP 138/100 | Ht 72.0 in | Wt 302.0 lb

## 2012-02-14 DIAGNOSIS — I10 Essential (primary) hypertension: Secondary | ICD-10-CM

## 2012-02-14 MED ORDER — AMLODIPINE BESYLATE 10 MG PO TABS: 10.0000 mg | ORAL_TABLET | Freq: Every day | ORAL | Status: DC

## 2012-02-14 MED ORDER — HYDROCHLOROTHIAZIDE 25 MG PO TABS: 25.0000 mg | ORAL_TABLET | Freq: Every day | ORAL | Status: DC

## 2012-02-14 MED ORDER — ATENOLOL 50 MG PO TABS: 50.0000 mg | ORAL_TABLET | Freq: Every day | ORAL | Status: DC

## 2012-02-14 NOTE — Telephone Encounter (Signed)
Refill request

## 2012-02-14 NOTE — Patient Instructions (Signed)
Follow-up with your primary doctor in 1-2 months  Will get fasting labs (only water for 8 hours)  New medicine- HCTZ for blood pressure.  Keep taking amlodipine and atenolol  Work on increasing physical activity, weight loss, and decreasing alcohol

## 2012-02-14 NOTE — Progress Notes (Signed)
  Subjective:    Patient ID: Martin Rose, male    DOB: 1976-03-06, 36 y.o.   MRN: [id number]  HPI Needs refills on BP meds, going out of town, wanted a check up before then  HYPERTENSION  BP Readings from Last 3 Encounters:  02/14/12 138/100  11/24/11 130/85  06/23/11 137/86    Hypertension ROS: taking medications as instructed, no medication side effects noted, home BP monitoring in range of 130-160's systolic over 80-100's diastolic, no TIA's, no chest pain on exertion, no dyspnea on exertion, no swelling of ankles and no intermittent claudication symptoms.   Notes LE edema much improved when driving is he keeps his shoes on  Has been watching salt, continue to drink alcohol  Review of Systemssee HPI     Objective:   Physical Exam GEN: Alert & Oriented, No acute distress CV:  Regular Rate & Rhythm, no murmur Respiratory:  Normal work of breathing, CTAB Abd:  + BS, soft, no tenderness to palpation Ext: no pre-tibial edema        Assessment & Plan:

## 2012-02-14 NOTE — Assessment & Plan Note (Signed)
Will add on hctz to BB and amlodipine for additional BP control and may help with minimal LE edema.  Advised to follow-up with PCP for further adjustment of meds and needs comprehensive care for risk factor management- we discussed his LVH, needs lipids checked.

## 2012-09-04 ENCOUNTER — Ambulatory Visit (INDEPENDENT_AMBULATORY_CARE_PROVIDER_SITE_OTHER): Payer: Self-pay | Admitting: Family Medicine

## 2012-09-04 ENCOUNTER — Encounter: Payer: Self-pay | Admitting: Family Medicine

## 2012-09-04 VITALS — BP 149/89 | HR 74 | Temp 98.2°F | Ht 72.0 in | Wt 307.0 lb

## 2012-09-04 DIAGNOSIS — L732 Hidradenitis suppurativa: Secondary | ICD-10-CM | POA: Insufficient documentation

## 2012-09-04 DIAGNOSIS — Z23 Encounter for immunization: Secondary | ICD-10-CM

## 2012-09-04 DIAGNOSIS — I10 Essential (primary) hypertension: Secondary | ICD-10-CM

## 2012-09-04 MED ORDER — ATENOLOL 50 MG PO TABS: 50.0000 mg | ORAL_TABLET | Freq: Every day | ORAL | Status: DC

## 2012-09-04 MED ORDER — LISINOPRIL 10 MG PO TABS: 10.0000 mg | ORAL_TABLET | Freq: Every day | ORAL | Status: DC

## 2012-09-04 MED ORDER — AMLODIPINE BESYLATE 10 MG PO TABS: 10.0000 mg | ORAL_TABLET | Freq: Every day | ORAL | Status: DC

## 2012-09-04 NOTE — Assessment & Plan Note (Signed)
No cellulitis or abscess.  Patient agreeable to continue conservative tx with antibacterial soak and warm compresses.  Would consider antibiotics (docycycline) if continues to be inflammatory

## 2012-09-04 NOTE — Progress Notes (Signed)
  Subjective:    Patient ID: Martin Rose, male    DOB: September 04, 1976, 36 y.o.   MRN: [id number]  HPI  HYPERTENSION  BP Readings from Last 3 Encounters:  09/04/12 149/89  02/14/12 138/100  11/24/11 130/85    Hypertension ROS: taking medications as instructed, no medication side effects noted, home BP monitoring in range of 146's systolic over 105's diastolic, no TIA's, no chest pain on exertion, no dyspnea on exertion, no swelling of ankles and no intermittent claudication symptoms.   Boil:  Under right axilla.  Sore.  Has had over 10 boils lanced in past per patient.  Has been present for 6 days.  No worsening pain. Fever, erythema or drainage.  I have reviewed patient's  PMH, FH, and Social history and Medications as related to this visit. No early cardiac history.  Review of Systems See HPI      Objective:   Physical Exam GEN: Alert & Oriented, No acute distress CV:  Regular Rate & Rhythm, no murmur Respiratory:  Normal work of breathing, CTAB Abd:  + BS, soft, no tenderness to palpation Ext: no pre-tibial edema Axilla:  Scar tissue, some tender nodules but hard no fluctuance masses felt.  Difficult due to extensive scar tissue.  No erythema or drainage/       Assessment & Plan:

## 2012-09-04 NOTE — Patient Instructions (Addendum)
New medicine for blood pressure- lisinopril  Your blood pressure should be less than 140/90  Make appt for fasting bloodwork in 7-10 days  Wash with antibacterial soap and use warm compresses.  If you  Notice worsening pain, spreading redness, please come back for recheck.  You got your flu shot today  Follow-up in 6 months

## 2012-09-04 NOTE — Assessment & Plan Note (Signed)
BP remains elevated at home and on today's visit.  Will add lisinopril 10 mg, patient to return for fasting bloodwork in 7-10 days.  Follow-up office visit in 6 months

## 2013-03-26 ENCOUNTER — Other Ambulatory Visit: Payer: Self-pay | Admitting: *Deleted

## 2013-03-26 MED ORDER — ATENOLOL 50 MG PO TABS: 50.0000 mg | ORAL_TABLET | Freq: Every day | ORAL | Status: DC

## 2013-03-26 MED ORDER — AMLODIPINE BESYLATE 10 MG PO TABS: 10.0000 mg | ORAL_TABLET | Freq: Every day | ORAL | Status: DC

## 2013-04-02 ENCOUNTER — Ambulatory Visit: Payer: Self-pay | Admitting: Family Medicine

## 2013-04-30 ENCOUNTER — Ambulatory Visit (INDEPENDENT_AMBULATORY_CARE_PROVIDER_SITE_OTHER): Payer: Self-pay | Admitting: Family Medicine

## 2013-04-30 ENCOUNTER — Encounter: Payer: Self-pay | Admitting: Family Medicine

## 2013-04-30 VITALS — BP 146/90 | HR 72 | Temp 97.7°F | Ht 70.0 in | Wt 319.3 lb

## 2013-04-30 DIAGNOSIS — I10 Essential (primary) hypertension: Secondary | ICD-10-CM

## 2013-04-30 DIAGNOSIS — R0609 Other forms of dyspnea: Secondary | ICD-10-CM

## 2013-04-30 DIAGNOSIS — R0989 Other specified symptoms and signs involving the circulatory and respiratory systems: Secondary | ICD-10-CM

## 2013-04-30 DIAGNOSIS — R0683 Snoring: Secondary | ICD-10-CM | POA: Insufficient documentation

## 2013-04-30 NOTE — Assessment & Plan Note (Signed)
Set two goals: see patient instructions.  Patient to follow-up with new PCP in 2-3 months

## 2013-04-30 NOTE — Assessment & Plan Note (Signed)
Will refill Lisinopril, continue amlodipine and atenolol.  Advised follow-up nurse visit and fasting labwork in 1 week.

## 2013-04-30 NOTE — Patient Instructions (Signed)
We discussed Tips for losing weight  Goals we talked about:  Cut back from 6 beer per night to 3 per night  Exercise:  Start tomorrow:  10 minutes of walking every night  Think about getting a pedometer- this will tell you how many steps you take each day.  Goal is 10,000 steps per day.  Consider scheduling sleep study  Come back for fasting lab work 1 week after you restart your lisinopril  Goal BP is less than 140/90

## 2013-04-30 NOTE — Progress Notes (Signed)
  Subjective:    Patient ID: Martin Rose, male    DOB: [birth year], 37 y.o.   MRN: [id number]  HPIHere for follow-up of hypertension  Since last seen about 8 months ago, has run out of lisinopril which was a new start.  Is out of amlodipine and atenolol but reports took them today. Did not return for fasting bloodwork.  Interested int talking about weight loss.   HYPERTENSION  BP Readings from Last 3 Encounters:  04/30/13 146/90  09/04/12 149/89  02/14/12 138/100    Hypertension ROS: taking medications as instructed, no medication side effects noted, no chest pain on exertion, no dyspnea on exertion, no swelling of ankles and no intermittent claudication symptoms.   Patient is a Naval architect.  States has been ordered a sleep study before, but could not get good data due to the fact he did not sleep long enough.  Takes an advil PM nightly.  Drinks beer to help him sleep.  6 12 oz beers per night. Endorses snoring, daytime somnolence Review of Systems See HPI    Objective:   Physical Exam GEN: Alert & Oriented, No acute distress CV:  Regular Rate & Rhythm, no murmur Respiratory:  Normal work of breathing, CTAB         Assessment & Plan:

## 2013-04-30 NOTE — Assessment & Plan Note (Signed)
Encouraged sleep study, discussed link between obesity and hypertension with sleep apnea and importance given his occupation.  He will defer until insured.

## 2013-05-02 ENCOUNTER — Other Ambulatory Visit: Payer: Self-pay | Admitting: Family Medicine

## 2013-05-02 MED ORDER — LISINOPRIL 10 MG PO TABS: 10.0000 mg | ORAL_TABLET | Freq: Every day | ORAL | Status: DC

## 2013-05-02 NOTE — Telephone Encounter (Signed)
Pts wife called the after hours line concerning pts BP meds. Not at pharmacy and pt going out of town for work tomorrow. Reviewed Dr. Mellody Life' note to confirm BP meds of Amlodipine, Lisinopril, and atenolol.  Sent to pharmacy electronically.  Pts wife aware.  Shelly Flatten, MD Family Medicine PGY-2 05/02/2013, 6:04 PM

## 2013-05-08 ENCOUNTER — Other Ambulatory Visit: Payer: Self-pay

## 2013-05-30 ENCOUNTER — Telehealth: Payer: Self-pay | Admitting: Family Medicine

## 2013-05-30 MED ORDER — AMLODIPINE BESYLATE 10 MG PO TABS: ORAL_TABLET | ORAL | Status: DC

## 2013-05-30 MED ORDER — LISINOPRIL 10 MG PO TABS: 10.0000 mg | ORAL_TABLET | Freq: Every day | ORAL | Status: DC

## 2013-05-30 MED ORDER — ATENOLOL 50 MG PO TABS: ORAL_TABLET | ORAL | Status: DC

## 2013-05-30 NOTE — Telephone Encounter (Signed)
Patient calls stating that at his last visit, Dr, Earnest Bailey told him she would write all of his rx for 3 months. But they were only written for one month and he is now out of his meds. Needs refills for 2 additional months. Call [phone] when sent to pharmacy.

## 2013-05-30 NOTE — Telephone Encounter (Signed)
Will fwd to MD.  Martin Rose, CMA  

## 2013-05-30 NOTE — Telephone Encounter (Signed)
Call the patient. We initially spoke on the phone he was unsure which medications he refill. I be discharged on the he needs all his blood pressure medication refill. It appears that he only got one month supply because he is due for blood work. I called back to the patient left a voicemail stating that I will refill his medication for one-month but will not refill it for longer until he gets his blood work done. Asked patient to call for questions regarding this.

## 2013-07-06 ENCOUNTER — Telehealth: Payer: Self-pay | Admitting: Family Medicine

## 2013-07-06 NOTE — Telephone Encounter (Signed)
Please call patient and  inform patient that he needs to call his pharmacy for refills. If he is out of refills from last rx, they will send a request via EPIC. I Will await EPIC request.

## 2013-07-06 NOTE — Telephone Encounter (Signed)
Pt  Is requesting refills on atenolo, lisinopril, and amlodipine sent to [facility] on [address]. JW

## 2013-07-09 ENCOUNTER — Other Ambulatory Visit: Payer: Self-pay

## 2013-07-09 ENCOUNTER — Telehealth: Payer: Self-pay | Admitting: Family Medicine

## 2013-07-09 NOTE — Telephone Encounter (Signed)
The patient is going to contact the pharmacy and request the refills for atenolol, lisinopril, and amlodipine, as requested by Dr. Armen Pickup.  The family wants to be sure that Dr. Armen Pickup understands that what they have been asking for is the number of refills on each medication be 3 because that is what Dr. Earnest Bailey had agreed to do when he was last seen, but that never happened.  This is to save them from having to request refills and wait every month.  As it is, he has been without his medication since Friday because they thought the pharmacy had refills and it wouldn't be a problem for him to get his medication in a timely manner.

## 2013-07-10 ENCOUNTER — Other Ambulatory Visit: Payer: Self-pay | Admitting: *Deleted

## 2013-07-10 MED ORDER — ATENOLOL 50 MG PO TABS: ORAL_TABLET | ORAL | Status: DC

## 2013-07-10 MED ORDER — AMLODIPINE BESYLATE 10 MG PO TABS: ORAL_TABLET | ORAL | Status: DC

## 2013-07-10 NOTE — Telephone Encounter (Signed)
I called the patient and left a voicemail. I have refilled amlodipine and atenolol for 30 days.  I will not refill lisinopril up until the patient has fasting blood work done (per instructions at his last office visit with Dr. Earnest Bailey)

## 2013-08-14 ENCOUNTER — Other Ambulatory Visit: Payer: Self-pay | Admitting: Family Medicine

## 2013-08-14 NOTE — Telephone Encounter (Signed)
Patient is going out of town for work and will be out of his meds before he returns. Would like Dr. Armen Pickup to refill his lisinopril, atenolol & amLODipine just enough until he can get back into town. Patient has made an appt to see Dr. Armen Pickup on 9/8 @ 11:30 am. Patient will be leaving town around 5 am tomorrow and will return Sunday 9/7.

## 2013-08-15 MED ORDER — AMLODIPINE BESYLATE 10 MG PO TABS: ORAL_TABLET | ORAL | Status: DC

## 2013-08-15 MED ORDER — ATENOLOL 50 MG PO TABS: ORAL_TABLET | ORAL | Status: DC

## 2013-08-20 ENCOUNTER — Ambulatory Visit (INDEPENDENT_AMBULATORY_CARE_PROVIDER_SITE_OTHER): Payer: Self-pay | Admitting: Family Medicine

## 2013-08-20 ENCOUNTER — Encounter: Payer: Self-pay | Admitting: Family Medicine

## 2013-08-20 VITALS — BP 116/82 | HR 67 | Temp 98.4°F | Ht 77.0 in | Wt 312.5 lb

## 2013-08-20 DIAGNOSIS — R0989 Other specified symptoms and signs involving the circulatory and respiratory systems: Secondary | ICD-10-CM

## 2013-08-20 DIAGNOSIS — R0683 Snoring: Secondary | ICD-10-CM

## 2013-08-20 DIAGNOSIS — R0609 Other forms of dyspnea: Secondary | ICD-10-CM

## 2013-08-20 DIAGNOSIS — I10 Essential (primary) hypertension: Secondary | ICD-10-CM

## 2013-08-20 LAB — CBC
HCT: 40.9 % (ref 39.0–52.0)
Hemoglobin: 13.7 g/dL (ref 13.0–17.0)
MCHC: 33.5 g/dL (ref 30.0–36.0)
RBC: 5.12 MIL/uL (ref 4.22–5.81)

## 2013-08-20 LAB — LIPID PANEL
HDL: 53 mg/dL (ref >39)
LDL Cholesterol: 39 mg/dL (ref 0–99)
Total CHOL/HDL Ratio: 2.5 Ratio
Triglycerides: 211 mg/dL — ABNORMAL HIGH (ref <150)
VLDL: 42 mg/dL — ABNORMAL HIGH (ref 0–40)

## 2013-08-20 LAB — COMPREHENSIVE METABOLIC PANEL
AST: 30 U/L (ref 0–37)
BUN: 12 mg/dL (ref 6–23)
Calcium: 9.6 mg/dL (ref 8.4–10.5)
Chloride: 104 mEq/L (ref 96–112)
Creat: 1.07 mg/dL (ref 0.50–1.35)

## 2013-08-20 MED ORDER — AMLODIPINE BESYLATE 10 MG PO TABS: ORAL_TABLET | ORAL | Status: DC

## 2013-08-20 MED ORDER — ATENOLOL 50 MG PO TABS: ORAL_TABLET | ORAL | Status: DC

## 2013-08-20 NOTE — Assessment & Plan Note (Signed)
A: improved. Compliant with meds. P: continue current regimen. Patient has opted to keep medications as is. Blood work per orders. F/u in  3 months with BP titration at f/u.

## 2013-08-20 NOTE — Patient Instructions (Addendum)
Mr. Beazer,  Thank you for coming in today. I was a pleasure meeting you and Mrs. Marceaux.  Your blood pressure is doing very well! Wonderful job on your [employer] loss!!    Please keep up the good work.  Plan for follow up with me in 3 months to recheck BP and make adjustments as needed.  Call if you develop symptoms of low BP (dizziness upon standing, ligtheadedness)   Dr. Armen Pickup

## 2013-08-20 NOTE — Progress Notes (Signed)
  Subjective:    Patient ID: Martin Rose, male    DOB: Jan 02, 1976, 36 y.o.   MRN: [id number]  HPI 58 y M patient presents with his wife to discuss the following:  1. HTN:  Meds: compliant with atenolol and amlodipine BP monitoring: does not check at home +: Le edema -: see ROS Smoker: none. Quit in 2010  2. Obesity: working to lose weight. Purposefully lost 7 lbs. Continues to snore. Admits to daytime somnolence sometimes. Is still a truck driver.   Review of Systems  Constitutional: Negative.   HENT:       Positive snoring.   Eyes: Negative for visual disturbance.  Respiratory: Negative for chest tightness and shortness of breath.   Cardiovascular: Positive for leg swelling. Negative for chest pain and palpitations.  Neurological: Negative for dizziness and light-headedness.      Objective:   Physical Exam  Nursing note and vitals reviewed. Constitutional: He appears well-developed and well-nourished. No distress.  HENT:  Head: Normocephalic and atraumatic.  Neck: Carotid bruit is not present.  Cardiovascular: Normal rate, regular rhythm, normal heart sounds and intact distal pulses.   Pulmonary/Chest: Effort normal and breath sounds normal. He has no wheezes. He has no rales.  Musculoskeletal: He exhibits edema.  Trace LE edema bilaterally       Assessment & Plan:

## 2013-08-20 NOTE — Assessment & Plan Note (Signed)
Persistent. Sleep study eval once insured. Patient to call when insured and order will be placed.

## 2013-08-20 NOTE — Assessment & Plan Note (Signed)
Improved with weight lose Encouraged efforts

## 2014-01-17 ENCOUNTER — Telehealth: Payer: Self-pay | Admitting: Family Medicine

## 2014-01-17 DIAGNOSIS — I10 Essential (primary) hypertension: Secondary | ICD-10-CM

## 2014-01-17 MED ORDER — ATENOLOL 50 MG PO TABS: ORAL_TABLET | ORAL | Status: DC

## 2014-01-17 MED ORDER — AMLODIPINE BESYLATE 10 MG PO TABS: ORAL_TABLET | ORAL | Status: DC

## 2014-01-17 NOTE — Telephone Encounter (Signed)
Refills sent in

## 2014-01-17 NOTE — Telephone Encounter (Signed)
Pt called and needs a refill on both of his BP medications. jw °

## 2014-09-04 ENCOUNTER — Telehealth: Payer: Self-pay | Admitting: Family Medicine

## 2014-09-04 DIAGNOSIS — I10 Essential (primary) hypertension: Secondary | ICD-10-CM

## 2014-09-04 MED ORDER — ATENOLOL 50 MG PO TABS: ORAL_TABLET | ORAL | Status: DC

## 2014-09-04 MED ORDER — AMLODIPINE BESYLATE 10 MG PO TABS: ORAL_TABLET | ORAL | Status: DC

## 2014-09-04 NOTE — Telephone Encounter (Signed)
Needs refills on amlodipine and attenalol No appts available until Oct 20

## 2015-04-24 ENCOUNTER — Other Ambulatory Visit: Payer: Self-pay | Admitting: Family Medicine

## 2015-05-16 ENCOUNTER — Encounter: Payer: Self-pay | Admitting: Family Medicine

## 2015-05-30 ENCOUNTER — Other Ambulatory Visit: Payer: Self-pay | Admitting: Family Medicine

## 2015-06-02 NOTE — Telephone Encounter (Signed)
Note to nursing staff - patient needs to be seen before additional refills, please schedule with PCP Dr. Gwendolyn Grant asap, I will provide 2 week supply of medications.

## 2015-06-03 MED ORDER — AMLODIPINE BESYLATE 10 MG PO TABS: 10.0000 mg | ORAL_TABLET | Freq: Every day | ORAL | Status: DC

## 2015-06-03 MED ORDER — ATENOLOL 50 MG PO TABS: 50.0000 mg | ORAL_TABLET | Freq: Every day | ORAL | Status: DC

## 2015-06-03 NOTE — Addendum Note (Signed)
Addended by: Uvaldo Rising on: 06/03/2015 12:32 PM   Modules accepted: Orders

## 2015-06-03 NOTE — Telephone Encounter (Signed)
Pt informed and agreeable.  He can only make appts on Mondays or Tuesdays.   Dr. Westly Pam first avaliable is not until July 12, so he will need an additional week of meds to last until appt.  Will forward back to MD. Martin Rose, [name]

## 2015-06-24 ENCOUNTER — Ambulatory Visit (INDEPENDENT_AMBULATORY_CARE_PROVIDER_SITE_OTHER): Payer: Self-pay | Admitting: Family Medicine

## 2015-06-24 ENCOUNTER — Encounter: Payer: Self-pay | Admitting: Family Medicine

## 2015-06-24 VITALS — BP 141/77 | HR 62 | Temp 97.8°F | Ht 77.0 in | Wt 310.0 lb

## 2015-06-24 DIAGNOSIS — J309 Allergic rhinitis, unspecified: Secondary | ICD-10-CM | POA: Insufficient documentation

## 2015-06-24 DIAGNOSIS — I10 Essential (primary) hypertension: Secondary | ICD-10-CM

## 2015-06-24 MED ORDER — ATENOLOL 50 MG PO TABS: 50.0000 mg | ORAL_TABLET | Freq: Every day | ORAL | Status: DC

## 2015-06-24 MED ORDER — CETIRIZINE HCL 10 MG PO TABS: 10.0000 mg | ORAL_TABLET | Freq: Every day | ORAL | Status: DC

## 2015-06-24 MED ORDER — AMLODIPINE BESYLATE 10 MG PO TABS: 10.0000 mg | ORAL_TABLET | Freq: Every day | ORAL | Status: DC

## 2015-06-24 NOTE — Patient Instructions (Signed)
It was good to meet you today.  Keep working hard on losing weight.  Even walking 30 minutes a day 5 days a week can help.  Like you said, sticking to a schedule is the best.   Refill for meds today.   The congestion is from allergies.  Take Zyrtec/Ceterizine to help with this.   I'll see you back in 6 months.

## 2015-06-24 NOTE — Assessment & Plan Note (Signed)
New diagnosis today. Treat with Cetirizine.

## 2015-06-24 NOTE — Progress Notes (Signed)
Subjective:    Martin Rose is a 39 y.o. male who [facility] for [facility] of chronic issues and to meet his new doctor.  Has not followed up previously since 2014 secondary to insurance/financial issues:  1.  Hypertension:  Long-term problem for this patient.  No adverse effects from medication. Has been on atenolol/norvasc combination for some years. Not checking it regularly.  No HA, CP, dizziness, shortness of breath, palpitations, or LE swelling.  Too last dose of Norvasc 1-2 days ago.  Has 1 pill of atenolol today.  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  Denies fatigue.  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  Has never had a sleep study secondary to financial issues.  Not doing much activity.  Wants to lose weight for his 39 year old granddaughter.   3.  Congestion:  Feels like "[place]'m [place] in [place]" much of the day -- states ears feel clogged.  Has fairly constant eye/nose drainage.  Worse first thing in AM.  Has tried Zantac from his wife without relief.  ROS as above per HPI, otherwise neg.  Pertinently, no chest pain, palpitations, SOB, Fever, Chills, Abd pain, N/V/D.   The following portions of the patient's history were reviewed and updated as appropriate: allergies, current medications, past medical history, family and social history, and problem list. Patient is a former smoker - quit 6 years ago.      PMH reviewed.  Past Medical History  Diagnosis Date  . Hypertension    No past surgical history on file.  Medications reviewed. Current Outpatient Prescriptions  Medication Sig Dispense Refill  . amLODipine (NORVASC) 10 MG tablet Take 1 tablet (10 mg total) by mouth daily. 21 tablet 0  . aspirin (ANACIN) 81 MG EC tablet Take 81 mg by mouth daily.      Marland Kitchen. atenolol (TENORMIN) 50 MG tablet Take 1 tablet (50 mg total) by mouth daily. 21 tablet 0   No current  facility-administered medications for this visit.     Objective:   Physical Exam BP 141/77 mmHg  Pulse 62  Temp(Src) 97.8 F (36.6 C) (Oral)  Ht 6\' 5"  (1.956 m)  Wt 310 lb (140.615 kg)  BMI 36.75 kg/m2 Gen:  Alert, cooperative patient who appears stated age in no acute distress.  Vital signs reviewed. HEENT: EOMI,  MMM.  Nasal turbinates boggy and enlarged BL.  No drainage currently.  Cardiac:  Regular rate and rhythm without murmur auscultated.  Good S1/S2. Pulm:  Clear to auscultation bilaterally with good air movement.   Abd:  Soft/nondistended/nontender.  Obese Exts: Trace LE edema.   No results found for this or any previous visit (from the past 72 hour(s)).

## 2015-06-24 NOTE — Assessment & Plan Note (Signed)
Discussed activity levels and activity schedule.    He seems motivated.   Encouraged efforts.

## 2015-06-24 NOTE — Assessment & Plan Note (Addendum)
Refill for meds today. Has been off Norvasc for several days and still close to goal.  No change to regimen.  Encouraged weight loss.  Fairly stable.  FU in 6 months.

## 2016-04-02 ENCOUNTER — Telehealth: Payer: Self-pay | Admitting: Family Medicine

## 2016-04-02 MED ORDER — AMLODIPINE BESYLATE 10 MG PO TABS: 10.0000 mg | ORAL_TABLET | Freq: Every day | ORAL | Status: DC

## 2016-04-02 NOTE — Telephone Encounter (Signed)
Pt called and needs a refill on his amlodipine called in. He is out. Myriam Jacobsonjw

## 2016-04-02 NOTE — Telephone Encounter (Signed)
Done

## 2016-04-05 ENCOUNTER — Telehealth: Payer: Self-pay | Admitting: Family Medicine

## 2016-04-05 MED ORDER — ATENOLOL 50 MG PO TABS: 50.0000 mg | ORAL_TABLET | Freq: Every day | ORAL | Status: DC

## 2016-04-05 NOTE — Telephone Encounter (Signed)
Pt is calling and needs a refill on his Atenolol called in. jw

## 2016-04-27 ENCOUNTER — Ambulatory Visit: Payer: Self-pay | Admitting: Family Medicine

## 2016-07-28 ENCOUNTER — Other Ambulatory Visit: Payer: Self-pay | Admitting: Family Medicine

## 2016-07-28 MED ORDER — AMLODIPINE BESYLATE 10 MG PO TABS: 10.0000 mg | ORAL_TABLET | Freq: Every day | ORAL | 1 refills | Status: DC

## 2016-07-28 MED ORDER — ATENOLOL 50 MG PO TABS: 50.0000 mg | ORAL_TABLET | Freq: Every day | ORAL | 1 refills | Status: DC

## 2016-07-28 NOTE — Telephone Encounter (Signed)
Refill for both sent in x 2 month supply

## 2016-07-28 NOTE — Telephone Encounter (Signed)
Pt has physical appt. 08/09/16 however out of RX today for Amlodipine and Atenolol. Can enough be called into Pharmacy until seen. [facility] on [address]. Please let patient know. [phone]

## 2016-07-29 NOTE — Telephone Encounter (Signed)
Called number provided. Phone rang with no option to leave message, just stopped ringing. If pt calls, please let him know that meds have been refilled for 2 months.  Sunday SpillersSharon T Greene Diodato, CMA

## 2016-08-09 ENCOUNTER — Encounter: Payer: Self-pay | Admitting: Family Medicine

## 2016-08-09 ENCOUNTER — Ambulatory Visit (INDEPENDENT_AMBULATORY_CARE_PROVIDER_SITE_OTHER): Payer: Self-pay | Admitting: Family Medicine

## 2016-08-09 VITALS — BP 150/91 | HR 57 | Temp 97.9°F | Ht 77.0 in | Wt 290.6 lb

## 2016-08-09 DIAGNOSIS — Z114 Encounter for screening for human immunodeficiency virus [HIV]: Secondary | ICD-10-CM

## 2016-08-09 DIAGNOSIS — R0683 Snoring: Secondary | ICD-10-CM

## 2016-08-09 DIAGNOSIS — I10 Essential (primary) hypertension: Secondary | ICD-10-CM

## 2016-08-09 DIAGNOSIS — K649 Unspecified hemorrhoids: Secondary | ICD-10-CM

## 2016-08-09 DIAGNOSIS — Z0000 Encounter for general adult medical examination without abnormal findings: Secondary | ICD-10-CM

## 2016-08-09 LAB — BASIC METABOLIC PANEL WITH GFR
BUN: 12 mg/dL (ref 7–25)
CHLORIDE: 105 mmol/L (ref 98–110)
CO2: 29 mmol/L (ref 20–31)
Calcium: 9.7 mg/dL (ref 8.6–10.3)
Creat: 1.08 mg/dL (ref 0.60–1.35)
GFR, EST NON AFRICAN AMERICAN: 85 mL/min (ref >=60)
GFR, Est African American: >89 mL/min (ref >=60)
Glucose, Bld: 95 mg/dL (ref 65–99)
POTASSIUM: 5 mmol/L (ref 3.5–5.3)
Sodium: 142 mmol/L (ref 135–146)

## 2016-08-09 LAB — LIPID PANEL
CHOL/HDL RATIO: 2 ratio (ref <=5.0)
CHOLESTEROL: 142 mg/dL (ref 125–200)
HDL: 70 mg/dL (ref >=40)
LDL Cholesterol: 53 mg/dL (ref <130)
Triglycerides: 93 mg/dL (ref <150)
VLDL: 19 mg/dL (ref <30)

## 2016-08-09 LAB — HIV ANTIBODY (ROUTINE TESTING W REFLEX): HIV 1&2 Ab, 4th Generation: NONREACTIVE

## 2016-08-09 MED ORDER — HYDROCORTISONE 2.5 % RE CREA: 1.0000 "application " | TOPICAL_CREAM | Freq: Two times a day (BID) | RECTAL | 0 refills | Status: DC

## 2016-08-09 MED ORDER — PRAMOXINE HCL 1 % RE FOAM: 1.0000 "application " | Freq: Three times a day (TID) | RECTAL | 0 refills | Status: DC | PRN

## 2016-08-09 MED ORDER — AMLODIPINE BESYLATE 10 MG PO TABS: 10.0000 mg | ORAL_TABLET | Freq: Every day | ORAL | 12 refills | Status: DC

## 2016-08-09 MED ORDER — ATENOLOL 50 MG PO TABS: 50.0000 mg | ORAL_TABLET | Freq: Every day | ORAL | 12 refills | Status: DC

## 2016-08-09 NOTE — Patient Instructions (Signed)
I have sent in 2 creams for your hemorrhoids.  Use NO MORE THAN 7 days.  Call me if you have no relief and I will send in a referral to see a specialist.  Follow the instructions as outlined below. I will contact you will the results of your labs.  If anything is abnormal, I will call you.  Otherwise, expect a copy to be mailed to you.   Hemorrhoids Hemorrhoids are swollen veins around the rectum or anus. There are two types of hemorrhoids:   Internal hemorrhoids. These occur in the veins just inside the rectum. They may poke through to the outside and become irritated and painful.  External hemorrhoids. These occur in the veins outside the anus and can be felt as a painful swelling or hard lump near the anus. CAUSES  Pregnancy.   Obesity.   Constipation or diarrhea.   Straining to have a bowel movement.   Sitting for long periods on the toilet.  Heavy lifting or other activity that caused you to strain.  Anal intercourse. SYMPTOMS   Pain.   Anal itching or irritation.   Rectal bleeding.   Fecal leakage.   Anal swelling.   One or more lumps around the anus.  DIAGNOSIS  Your caregiver may be able to diagnose hemorrhoids by visual examination. Other examinations or tests that may be performed include:   Examination of the rectal area with a gloved hand (digital rectal exam).   Examination of anal canal using a small tube (scope).   A blood test if you have lost a significant amount of blood.  A test to look inside the colon (sigmoidoscopy or colonoscopy). TREATMENT Most hemorrhoids can be treated at home. However, if symptoms do not seem to be getting better or if you have a lot of rectal bleeding, your caregiver may perform a procedure to help make the hemorrhoids get smaller or remove them completely. Possible treatments include:   Placing a rubber band at the base of the hemorrhoid to cut off the circulation (rubber band ligation).   Injecting a  chemical to shrink the hemorrhoid (sclerotherapy).   Using a tool to burn the hemorrhoid (infrared light therapy).   Surgically removing the hemorrhoid (hemorrhoidectomy).   Stapling the hemorrhoid to block blood flow to the tissue (hemorrhoid stapling).  HOME CARE INSTRUCTIONS   Eat foods with fiber, such as whole grains, beans, nuts, fruits, and vegetables. Ask your doctor about taking products with added fiber in them (fibersupplements).  Increase fluid intake. Drink enough water and fluids to keep your urine clear or pale yellow.   Exercise regularly.   Go to the bathroom when you have the urge to have a bowel movement. Do not wait.   Avoid straining to have bowel movements.   Keep the anal area dry and clean. Use wet toilet paper or moist towelettes after a bowel movement.   Medicated creams and suppositories may be used or applied as directed.   Only take over-the-counter or prescription medicines as directed by your caregiver.   Take warm sitz baths for 15-20 minutes, 3-4 times a day to ease pain and discomfort.   Place ice packs on the hemorrhoids if they are tender and swollen. Using ice packs between sitz baths may be helpful.   Put ice in a plastic bag.   Place a towel between your skin and the bag.   Leave the ice on for 15-20 minutes, 3-4 times a day.   Do not use a  donut-shaped pillow or sit on the toilet for long periods. This increases blood pooling and pain.  SEEK MEDICAL CARE IF:  You have increasing pain and swelling that is not controlled by treatment or medicine.  You have uncontrolled bleeding.  You have difficulty or you are unable to have a bowel movement.  You have pain or inflammation outside the area of the hemorrhoids. MAKE SURE YOU:  Understand these instructions.  Will watch your condition.  Will get help right away if you are not doing well or get worse.   This information is not intended to replace advice given to  you by your health care provider. Make sure you discuss any questions you have with your health care provider.   Document Released: 11/26/2000 Document Revised: 11/15/2012 Document Reviewed: 10/03/2012 Elsevier Interactive Patient Education [employer]2016 Elsevier Inc.

## 2016-08-09 NOTE — Assessment & Plan Note (Signed)
BP elevated today.  Unfortunately not rechecked prior to dc.  Will have patient return in 1 week for BP check with RN.  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  BMP, lipid panel

## 2016-08-09 NOTE — Progress Notes (Signed)
Martin Rose is a 40 y.o. male presents to office today for annual physical exam examination.  Concerns today include:  1. Hemorrhoids Patient reports several year history of hemorrhoid.  Has used Tucks, Prep-H, witch hazel which help only minimally.  He notes pain with defecation and with certain movements.  Works lifting heavy objects regularly, so straining.  Notes normal bowel movements.  Occ blood in stool.  Reports rectal itching.  No family h/o colon cancer.  Last eye exam: 11/20116 Last dental exam: >1 year Immunizations needed: TDap, flu  Past Medical History:  Diagnosis Date  . Hypertension    Social History   Social History  . Marital status: Married    Spouse name: N/A  . Number of children: N/A  . Years of education: N/A   Occupational History  . Not on file.   Social History Main Topics  . Smoking status: Former Games developer  . Smokeless tobacco: Never Used  . Alcohol use Not on file  . Drug use:     Types: Marijuana     Comment: occ  . Sexual activity: Yes   Other Topics Concern  . Not on file   Social History Narrative  . No narrative on file   History reviewed. No pertinent surgical history. Family History  Problem Relation Age of Onset  . Hypertension Mother   . Hypertension Father   . Cancer Sister 30    brain tumor  . Stroke Sister     ROS: Review of Systems Constitutional: negative Eyes: negative Ears, nose, mouth, throat, and face: negative Respiratory: negative Cardiovascular: negative Gastrointestinal: positive for hemorrhoid, scant rectal bleeding with defecation Genitourinary:negative Integument/breast: negative Hematologic/lymphatic: negative Musculoskeletal:positive for golfer's elbow Neurological: negative Behavioral/Psych: negative Endocrine: negative Allergic/Immunologic: negative   Physical exam BP (!) 150/91   Pulse (!) 57   Temp 97.9 F (36.6 C) (Oral)   Ht 6\' 5"  (1.956 m)   Wt 290 lb 9.6 oz (131.8 kg)   BMI  34.46 kg/m  General appearance: alert, cooperative, appears stated age and no distress Head: Normocephalic, without obvious abnormality, atraumatic Eyes: negative findings: lids and lashes normal, conjunctivae and sclerae normal, corneas clear and pupils equal, round, reactive to light and accomodation Ears: normal TM's and external ear canals both ears Nose: Nares normal. Septum midline. Mucosa normal. No drainage or sinus tenderness. Throat: lips, mucosa, and tongue normal; teeth and gums normal Neck: no adenopathy, supple, symmetrical, trachea midline, thyroid not enlarged, symmetric, no tenderness/mass/nodules and large neck girth Back: symmetric, no curvature. ROM normal. No CVA tenderness. Lungs: clear to auscultation bilaterally Chest wall: no tenderness Heart: regular rate and rhythm, S1, S2 normal, no murmur, click, rub or gallop Abdomen: soft, non-tender; bowel sounds normal; no masses,  no organomegaly Rectal: external hemorrhoid appreciated at the 6 o'clock position, no rectal bleeding or fissues appreciated Extremities: extremities normal, atraumatic, no cyanosis or edema Pulses: 2+ and symmetric Skin: Skin color, texture, turgor normal. No rashes or lesions Lymph nodes: Cervical, supraclavicular, and axillary nodes normal. Neurologic: Alert and oriented X 3, normal strength and tone. Normal symmetric reflexes. Normal coordination and gait    Assessment/ Plan: Martin Rose here for annual physical exam.   Essential hypertension, benign BP elevated today.  Unfortunately not rechecked prior to dc.  Will have patient return in 1 week for BP check with RN.  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  BMP, lipid panel  Snoring Will refer to sleep studies for consideration of polysomnogram.  Per patient unable to be dx in past.  Not currently on CPAP.  Hemorrhoids, unspecified hemorrhoid type -  pramoxine (PROCTOFOAM) 1 % foam; Place 1 application rectally 3 (three) times daily as needed for itching. x7 days  Dispense: 15 g; Refill: 0 - hydrocortisone (ANUSOL-HC) 2.5 % rectal cream; Place 1 application rectally 2 (two) times daily. x7 days  Dispense: 30 g; Refill: 0 - Discussed not using above medications for >7 days at a time, as can cause atrophy of skin. - If no improvement with above meds, will plan to refer to GI for further evaluation.  Follow up with RN in 1 week for BP check.   [name], DO PGY-3, [facility]

## 2016-08-09 NOTE — Assessment & Plan Note (Signed)
Will refer to sleep studies for consideration of polysomnogram.  Per patient unable to be dx in past.  Not currently on CPAP.

## 2016-08-10 ENCOUNTER — Encounter: Payer: Self-pay | Admitting: Family Medicine

## 2017-07-11 ENCOUNTER — Ambulatory Visit (INDEPENDENT_AMBULATORY_CARE_PROVIDER_SITE_OTHER): Payer: Self-pay | Admitting: Family Medicine

## 2017-07-11 ENCOUNTER — Encounter: Payer: Self-pay | Admitting: Family Medicine

## 2017-07-11 ENCOUNTER — Encounter: Payer: Self-pay | Admitting: *Deleted

## 2017-07-11 ENCOUNTER — Ambulatory Visit (HOSPITAL_COMMUNITY)
Admission: RE | Admit: 2017-07-11 | Discharge: 2017-07-11 | Disposition: A | Discharge status: Home / Self Care | Payer: Self-pay | Source: Ambulatory Visit | Attending: Family Medicine | Admitting: Family Medicine

## 2017-07-11 VITALS — BP 118/82 | HR 61 | Temp 97.8°F | Ht 72.0 in | Wt 299.4 lb

## 2017-07-11 DIAGNOSIS — Z789 Other specified health status: Secondary | ICD-10-CM

## 2017-07-11 DIAGNOSIS — R002 Palpitations: Secondary | ICD-10-CM

## 2017-07-11 DIAGNOSIS — R9431 Abnormal electrocardiogram [ECG] [EKG]: Secondary | ICD-10-CM | POA: Insufficient documentation

## 2017-07-11 DIAGNOSIS — Z7289 Other problems related to lifestyle: Secondary | ICD-10-CM | POA: Insufficient documentation

## 2017-07-11 HISTORY — DX: Palpitations: R00.2

## 2017-07-11 NOTE — Assessment & Plan Note (Addendum)
Patient reports non painful palpitations described as a abnormal not necessarily racing heart rhythm that occurs a few times per minute for a span of 20-30 minutes and resolves spontaneously.  Referring to outpatient cardiology.   ECG in office reviewed with Dr. Jennette KettleNeal, ~60bpm sinus rhythm.  Abnormal but showed no emergent ST changes.  Did have twave inversion in II, III, aVF, v5,v6.  Patient was well appearing in no distress

## 2017-07-11 NOTE — Patient Instructions (Signed)
Martin Rose, it was nice meeting you today.  I'm glad you came in to discuss your health and while we don't think you have an emergency situation, we would like you to followup with cardiology because of the symptoms you mentioned.   If you don't hear about an apt. Within 10 days please call the clinic here and leave me a message.  We also talked about your desire to trim down alcohol use and we found two options worth trying.   Either trim your drinking to every other day or decrease the nightly drinking to 2 beers.   This should help you feel healthier and more energetic the next day.

## 2017-07-11 NOTE — Progress Notes (Signed)
    Subjective:  Martin Rose is a 41 y.o. male who [facility] with a chief complaint of weight gain and palpitations.   HPI: Martin Rose is coming for his annual check up complaining of unexpected 9lb weight gain in the last few months and increased frequency of palpitations.   His activity level and diet have not changed.   Palpitations are not painful, not an increase in heart rate and not connected to activity level.   They are singular abnormal "extra" beats that come in clusters occurring a few times a minute for less than 30minutes when they resolve spontaneously.   He says he used to get this when he was 350lbs but they went away when he dieted and lost a lot of weight 5-6 yrs ago.   He drinks 4beers/day 7 days/wk and stopped long term smoking 5-61312yrs ago when he got a grandchild.   He has no orthopnea but has 1+ edema in his lower extremities and has notice lines on his calf when he takes his socks off at the end of the day.  We discussed health goals and desire to decrease drinking  Use to be 350lbs, mild hypertension well controlled, normal cholesterol at last check 6112yr ago.   Has had a cardiac workup yrs ago with a cath that showed no intervention needed (per patient)  Objective:  Physical Exam: BP 118/82 (BP Location: Left Arm, Patient Position: Sitting, Cuff Size: Large)   Pulse 61   Temp 97.8 F (36.6 C) (Oral)   Ht 6' (1.829 m)   Wt 135.8 kg (299 lb 6.4 oz)   SpO2 98%   BMI 40.61 kg/m   Gen:NAD, resting comfortably, obese but physically active CV: RRR with slight systolic murmur appreciated Pulm: NWOB, CTAB with no crackles, wheezes, or rhonchi GI: Normal bowel sounds present. Soft, Nontender, Nondistended. MSK: +1 pitting edema bilaterally in legs Skin: warm, dry Neuro: grossly normal, moves all extremities Psych: Normal affect and thought content  No results found for this or any previous visit (from the past 72 hour(s)).   Assessment/Plan:  Alcohol  use Martin Rose describes a desire to decrease his 4 beer/day 7day/week habits.   Current plan is to either go every other day or decrease daily use to 2 beers to see how that helps his energy levels  Palpitations Patient reports non painful palpitations described as a abnormal not necessarily racing heart rhythm that occurs a few times per minute for a span of 20-30 minutes and resolves spontaneously.  Referring to outpatient cardiology.   ECG in office reviewed with Dr. Jennette KettleNeal, ~60bpm sinus rhythm.  Abnormal but showed no emergent ST changes.  Did have twave inversion in II, III, aVF, v5,v6.  Patient was well appearing in no distress   Marthenia RollingScott Nhan Qualley, DO FAMILY MEDICINE RESIDENT - PGY1 07/11/2017 3:04 PM

## 2017-07-11 NOTE — Assessment & Plan Note (Signed)
Martin Rose describes a desire to decrease his 4 beer/day 7day/week habits.   Current plan is to either go every other day or decrease daily use to 2 beers to see how that helps his energy levels

## 2017-09-02 ENCOUNTER — Other Ambulatory Visit: Payer: Self-pay | Admitting: Family Medicine

## 2017-09-02 DIAGNOSIS — I10 Essential (primary) hypertension: Secondary | ICD-10-CM

## 2017-09-05 ENCOUNTER — Other Ambulatory Visit: Payer: Self-pay | Admitting: Family Medicine

## 2017-09-05 DIAGNOSIS — I10 Essential (primary) hypertension: Secondary | ICD-10-CM

## 2017-09-05 NOTE — Telephone Encounter (Signed)
Pt asked the pharmacy to send [place] a refill request for his Amlodipine and Atenolol. They sent this to a LPN who refused the refill. Can we send this in to his pharmacy today. He requested the medication on Friday 09/02/17, He is out of medication. jw

## 2017-09-06 MED ORDER — ATENOLOL 50 MG PO TABS: 50.0000 mg | ORAL_TABLET | Freq: Every day | ORAL | 12 refills | Status: DC

## 2017-09-06 MED ORDER — AMLODIPINE BESYLATE 10 MG PO TABS: 10.0000 mg | ORAL_TABLET | Freq: Every day | ORAL | 12 refills | Status: DC

## 2017-09-06 NOTE — Telephone Encounter (Signed)
Pt is wondering why it is taking so long to get refills on medications. He has not been able to take any medication since last Thursday. Please call him ASAP he is worried about not taking medications. jw

## 2018-03-20 ENCOUNTER — Encounter (HOSPITAL_COMMUNITY): Payer: Self-pay | Admitting: Emergency Medicine

## 2018-03-20 ENCOUNTER — Encounter (HOSPITAL_COMMUNITY): Payer: Self-pay | Admitting: *Deleted

## 2018-03-20 ENCOUNTER — Ambulatory Visit (HOSPITAL_COMMUNITY)
Admission: EM | Admit: 2018-03-20 | Discharge: 2018-03-20 | Disposition: A | Discharge status: Home / Self Care | Payer: BLUE CROSS/BLUE SHIELD | Source: Home / Self Care | Attending: Family Medicine | Admitting: Family Medicine

## 2018-03-20 ENCOUNTER — Ambulatory Visit: Payer: Self-pay | Admitting: Internal Medicine

## 2018-03-20 ENCOUNTER — Emergency Department (HOSPITAL_COMMUNITY)
Admission: EM | Admit: 2018-03-20 | Discharge: 2018-03-20 | Disposition: A | Discharge status: Home / Self Care | Payer: BLUE CROSS/BLUE SHIELD | Attending: Emergency Medicine | Admitting: Emergency Medicine

## 2018-03-20 DIAGNOSIS — K6289 Other specified diseases of anus and rectum: Secondary | ICD-10-CM | POA: Diagnosis not present

## 2018-03-20 DIAGNOSIS — Z5321 Procedure and treatment not carried out due to patient leaving prior to being seen by health care provider: Secondary | ICD-10-CM | POA: Diagnosis not present

## 2018-03-20 MED ORDER — IBUPROFEN 200 MG PO TABS
400.0000 mg | ORAL_TABLET | Freq: Once | ORAL | Status: AC | PRN
  Administered 2018-03-20: 400 mg via ORAL
  Filled 2018-03-20: qty 2

## 2018-03-20 NOTE — ED Notes (Signed)
Pt sts he will just follow up with urgent care in the morning

## 2018-03-20 NOTE — ED Triage Notes (Signed)
Pt sts abscess to left buttocks

## 2018-03-20 NOTE — ED Provider Notes (Signed)
[facility]    CSN: [id number] Arrival date & time: 03/20/18  1013     History   Chief Complaint Chief Complaint  Patient presents with  . Abscess    HPI Martin Rose is a 42 y.o. male.   42 year old male comes in for possible abscess.  States tenderness around the left buttock near the rectum. Started feeling pain and swelling for the past 2 days. Denies fever, chills, night sweats. Denies increase in size. States also has history of hemorrhoids, so has been trying to apply cream to help without improvement. Denies loss of bladder or bowel control. Denies history of diabetes.      Past Medical History:  Diagnosis Date  . Hypertension     Patient Active Problem List   Diagnosis Date Noted  . Palpitations 07/11/2017  . Alcohol use 07/11/2017  . Allergic rhinitis 06/24/2015  . Morbid obesity (HCC) 04/30/2013  . Snoring 04/30/2013  . Hidradenitis 09/04/2012  . Essential hypertension, benign 03/10/2009    History reviewed. No pertinent surgical history.     Home Medications    Prior to Admission medications   Medication Sig Start Date End Date Taking? Authorizing Provider  amLODipine (NORVASC) 10 MG tablet Take 1 tablet (10 mg total) by mouth daily. 09/06/17   Marthenia RollingBland, Scott, DO  atenolol (TENORMIN) 50 MG tablet Take 1 tablet (50 mg total) by mouth daily. 09/06/17   Marthenia RollingBland, Scott, DO  hydrocortisone (ANUSOL-HC) 2.5 % rectal cream Place 1 application rectally 2 (two) times daily. x7 days 08/09/16   Raliegh IpGottschalk, Ashly M, DO    Family History Family History  Problem Relation Age of Onset  . Hypertension Mother   . Hypertension Father   . Cancer Sister 30       brain tumor  . Stroke Sister     Social History Social History   Tobacco Use  . Smoking status: Former Games developermoker  . Smokeless tobacco: Never Used  Substance Use Topics  . Alcohol use: Not on file  . Drug use: Yes    Types: Marijuana    Comment: occ     Allergies   Patient has no known  allergies.   Review of Systems Review of Systems  Reason unable to perform ROS: See HPI as above.     Physical Exam Triage Vital Signs ED Triage Vitals [03/20/18 1047]  Enc Vitals Group     BP (!) 175/101     Pulse Rate 81     Resp 18     Temp 98.5 F (36.9 C)     Temp Source Oral     SpO2 100 %     Weight      Height      Head Circumference      Peak Flow      Pain Score      Pain Loc      Pain Edu?      Excl. in GC?    No data found.  Updated Vital Signs BP (!) 175/101 (BP Location: Left Arm)   Pulse 81   Temp 98.5 F (36.9 C) (Oral)   Resp 18   SpO2 100%   Physical Exam  Constitutional: He is oriented to person, place, and time. He appears well-developed and well-nourished. No distress.  HENT:  Head: Normocephalic and atraumatic.  Eyes: Pupils are equal, round, and reactive to light. Conjunctivae are normal.  Genitourinary:     Genitourinary Comments: Perirectal tenderness, swelling and erythema. No fluctuance felt.  Patient extremely tender, deferred rectal exam.   Neurological: He is alert and oriented to person, place, and time.     UC Treatments / Results  Labs (all labs ordered are listed, but only abnormal results are displayed) Labs Reviewed - No data to display  EKG None Radiology No results found.  Procedures Procedures (including critical care time)  Medications Ordered in UC Medications - No data to display   Initial Impression / Assessment and Plan / UC Course  I have reviewed the triage vital signs and the nursing notes.  Pertinent labs & imaging results that were available during my care of the patient were reviewed by me and considered in my medical decision making (see chart for details).    Case discussed with Dr Dayton Scrape. Given location of tenderness/swelling with extreme tenderness, will discharge patient in stable condition to the ED for further evaluation of possible perirectal abscess. Patient expresses understanding  and agrees to plan.   Final Clinical Impressions(s) / UC Diagnoses   Final diagnoses:  Perirectal discomfort    ED Discharge Orders    None        Belinda Fisher, PA-C 03/20/18 1545

## 2018-03-20 NOTE — ED Triage Notes (Signed)
Pt sent from urgent care concerned for perirectal abscess. Pt states he has had rectal pain for the past 2-3 days, primarily on the left buttocks. Pt denies blood or drainage. Pt has hx of hemorrhoids.

## 2018-03-20 NOTE — Discharge Instructions (Addendum)
Given your exam, worries for perirectal abscess/deep space infection. Please go to the emergency department for further evaluation needed.

## 2018-03-21 ENCOUNTER — Telehealth: Payer: Self-pay

## 2018-03-21 NOTE — Telephone Encounter (Signed)
Had voicemail from male caller regarding patient. Has appt tomorrow am for rectal abscess. Seen at urgent care yesterday and sent to ED. Apparently left ED. Needs advice on what to do for pain and symptoms until appt. Call back is [phone]/  Ples SpecterAlisa Mauri Tolen, RN [facility] RN)

## 2018-03-21 NOTE — Telephone Encounter (Signed)
Called patient a discovered they left ED because wait was too long.  They are scheduled for Dr. Deirdre Priesthambliss tomorrow.   There is no current bleeding, no fever symptoms, patient is mentating well with mild pain.  -Dr. Parke SimmersBland

## 2018-03-22 ENCOUNTER — Encounter: Payer: Self-pay | Admitting: Family Medicine

## 2018-03-22 ENCOUNTER — Encounter (HOSPITAL_COMMUNITY)
Admission: EM | Disposition: A | Discharge status: Home / Self Care | Payer: Self-pay | Source: Home / Self Care | Attending: General Surgery

## 2018-03-22 ENCOUNTER — Emergency Department (HOSPITAL_COMMUNITY): Payer: BLUE CROSS/BLUE SHIELD

## 2018-03-22 ENCOUNTER — Ambulatory Visit: Payer: BLUE CROSS/BLUE SHIELD | Admitting: Family Medicine

## 2018-03-22 ENCOUNTER — Encounter (HOSPITAL_COMMUNITY): Payer: Self-pay | Admitting: Emergency Medicine

## 2018-03-22 ENCOUNTER — Inpatient Hospital Stay (HOSPITAL_COMMUNITY)
Admission: EM | Admit: 2018-03-22 | Discharge: 2018-03-24 | DRG: 345 | Disposition: A | Discharge status: Home / Self Care | Payer: BLUE CROSS/BLUE SHIELD | Attending: General Surgery | Admitting: General Surgery

## 2018-03-22 ENCOUNTER — Emergency Department (HOSPITAL_COMMUNITY): Payer: BLUE CROSS/BLUE SHIELD | Admitting: Anesthesiology

## 2018-03-22 ENCOUNTER — Ambulatory Visit: Payer: Self-pay | Admitting: Surgery

## 2018-03-22 ENCOUNTER — Other Ambulatory Visit: Payer: Self-pay

## 2018-03-22 DIAGNOSIS — Z6841 Body Mass Index (BMI) 40.0 and over, adult: Secondary | ICD-10-CM

## 2018-03-22 DIAGNOSIS — K612 Anorectal abscess: Secondary | ICD-10-CM | POA: Diagnosis not present

## 2018-03-22 DIAGNOSIS — K611 Rectal abscess: Secondary | ICD-10-CM | POA: Insufficient documentation

## 2018-03-22 DIAGNOSIS — K610 Anal abscess: Secondary | ICD-10-CM

## 2018-03-22 DIAGNOSIS — I10 Essential (primary) hypertension: Secondary | ICD-10-CM | POA: Diagnosis present

## 2018-03-22 DIAGNOSIS — Z87891 Personal history of nicotine dependence: Secondary | ICD-10-CM

## 2018-03-22 HISTORY — PX: INCISION AND DRAINAGE PERIRECTAL ABSCESS: SHX1804

## 2018-03-22 LAB — COMPREHENSIVE METABOLIC PANEL
ALBUMIN: 3.7 g/dL (ref 3.5–5.0)
ALT: 18 U/L (ref 17–63)
ANION GAP: 11 (ref 5–15)
AST: 18 U/L (ref 15–41)
Alkaline Phosphatase: 84 U/L (ref 38–126)
BILIRUBIN TOTAL: 0.7 mg/dL (ref 0.3–1.2)
BUN: 9 mg/dL (ref 6–20)
CHLORIDE: 103 mmol/L (ref 101–111)
CO2: 27 mmol/L (ref 22–32)
Calcium: 9.5 mg/dL (ref 8.9–10.3)
Creatinine, Ser: 1.21 mg/dL (ref 0.61–1.24)
GFR calc Af Amer: >60 mL/min (ref >60)
GFR calc non Af Amer: >60 mL/min (ref >60)
GLUCOSE: 101 mg/dL — AB (ref 65–99)
POTASSIUM: 4.6 mmol/L (ref 3.5–5.1)
SODIUM: 141 mmol/L (ref 135–145)
TOTAL PROTEIN: 7.5 g/dL (ref 6.5–8.1)

## 2018-03-22 LAB — CBC WITH DIFFERENTIAL/PLATELET
Basophils Absolute: 0 10*3/uL (ref 0.0–0.1)
Basophils Relative: 0 %
Eosinophils Absolute: 0.3 10*3/uL (ref 0.0–0.7)
Eosinophils Relative: 2 %
HCT: 41.3 % (ref 39.0–52.0)
Hemoglobin: 13.5 g/dL (ref 13.0–17.0)
Lymphocytes Relative: 25 %
Lymphs Abs: 3.6 10*3/uL (ref 0.7–4.0)
MCH: 27.4 pg (ref 26.0–34.0)
MCHC: 32.7 g/dL (ref 30.0–36.0)
MCV: 83.8 fL (ref 78.0–100.0)
Monocytes Absolute: 0.9 10*3/uL (ref 0.1–1.0)
Monocytes Relative: 6 %
Neutro Abs: 9.7 10*3/uL — ABNORMAL HIGH (ref 1.7–7.7)
Neutrophils Relative %: 67 %
Platelets: 301 10*3/uL (ref 150–400)
RBC: 4.93 MIL/uL (ref 4.22–5.81)
RDW: 14.2 % (ref 11.5–15.5)
WBC: 14.6 10*3/uL — ABNORMAL HIGH (ref 4.0–10.5)

## 2018-03-22 SURGERY — INCISION AND DRAINAGE, ABSCESS, PERIRECTAL
Anesthesia: General | Laterality: Left

## 2018-03-22 MED ORDER — OXYCODONE HCL 5 MG PO TABS
5.0000 mg | ORAL_TABLET | ORAL | Status: DC | PRN
  Administered 2018-03-23: 10 mg via ORAL
  Administered 2018-03-23: 5 mg via ORAL
  Filled 2018-03-22 (×4): qty 2

## 2018-03-22 MED ORDER — FENTANYL CITRATE (PF) 100 MCG/2ML IJ SOLN
INTRAMUSCULAR | Status: DC | PRN
  Administered 2018-03-22: 50 ug via INTRAVENOUS
  Administered 2018-03-22: 100 ug via INTRAVENOUS

## 2018-03-22 MED ORDER — ENOXAPARIN SODIUM 40 MG/0.4ML ~~LOC~~ SOLN
40.0000 mg | SUBCUTANEOUS | Status: DC
  Administered 2018-03-23: 40 mg via SUBCUTANEOUS
  Filled 2018-03-22: qty 0.4

## 2018-03-22 MED ORDER — PROPOFOL 10 MG/ML IV BOLUS
INTRAVENOUS | Status: DC | PRN
  Administered 2018-03-22: 50 mg via INTRAVENOUS
  Administered 2018-03-22: 200 mg via INTRAVENOUS

## 2018-03-22 MED ORDER — LACTATED RINGERS IV SOLN
INTRAVENOUS | Status: DC
  Administered 2018-03-22: 21:00:00 via INTRAVENOUS

## 2018-03-22 MED ORDER — MIDAZOLAM HCL 5 MG/5ML IJ SOLN
INTRAMUSCULAR | Status: DC | PRN
  Administered 2018-03-22: 2 mg via INTRAVENOUS

## 2018-03-22 MED ORDER — AMLODIPINE BESYLATE 10 MG PO TABS
10.0000 mg | ORAL_TABLET | Freq: Every day | ORAL | Status: DC
  Administered 2018-03-23 – 2018-03-24 (×2): 10 mg via ORAL
  Filled 2018-03-22 (×2): qty 1

## 2018-03-22 MED ORDER — GLYCOPYRROLATE 0.2 MG/ML IJ SOLN
INTRAMUSCULAR | Status: DC | PRN
  Administered 2018-03-22: 0.2 mg via INTRAVENOUS

## 2018-03-22 MED ORDER — CIPROFLOXACIN IN D5W 400 MG/200ML IV SOLN
400.0000 mg | Freq: Once | INTRAVENOUS | Status: AC
  Administered 2018-03-22: 400 mg via INTRAVENOUS
  Filled 2018-03-22: qty 200

## 2018-03-22 MED ORDER — OXYCODONE HCL 5 MG PO TABS
ORAL_TABLET | ORAL | Status: AC
  Filled 2018-03-22: qty 1

## 2018-03-22 MED ORDER — METRONIDAZOLE IN NACL 5-0.79 MG/ML-% IV SOLN
500.0000 mg | Freq: Three times a day (TID) | INTRAVENOUS | Status: DC
  Administered 2018-03-23 – 2018-03-24 (×3): 500 mg via INTRAVENOUS
  Filled 2018-03-22 (×5): qty 100

## 2018-03-22 MED ORDER — ONDANSETRON HCL 4 MG/2ML IJ SOLN: 4.0000 mg | Freq: Four times a day (QID) | INTRAMUSCULAR | Status: DC | PRN

## 2018-03-22 MED ORDER — DEXAMETHASONE SODIUM PHOSPHATE 10 MG/ML IJ SOLN
INTRAMUSCULAR | Status: DC | PRN
  Administered 2018-03-22: 10 mg via INTRAVENOUS

## 2018-03-22 MED ORDER — HYDROMORPHONE HCL 1 MG/ML IJ SOLN
INTRAMUSCULAR | Status: AC
  Filled 2018-03-22: qty 1

## 2018-03-22 MED ORDER — IOPAMIDOL (ISOVUE-300) INJECTION 61%
INTRAVENOUS | Status: AC
  Filled 2018-03-22: qty 100

## 2018-03-22 MED ORDER — ACETAMINOPHEN 500 MG PO TABS
1000.0000 mg | ORAL_TABLET | Freq: Four times a day (QID) | ORAL | Status: DC
  Administered 2018-03-23 – 2018-03-24 (×5): 1000 mg via ORAL
  Filled 2018-03-22 (×6): qty 2

## 2018-03-22 MED ORDER — MIDAZOLAM HCL 2 MG/2ML IJ SOLN
INTRAMUSCULAR | Status: AC
  Filled 2018-03-22: qty 2

## 2018-03-22 MED ORDER — POTASSIUM CHLORIDE IN NACL 20-0.9 MEQ/L-% IV SOLN
INTRAVENOUS | Status: DC
  Administered 2018-03-22 – 2018-03-24 (×4): via INTRAVENOUS
  Filled 2018-03-22 (×3): qty 1000

## 2018-03-22 MED ORDER — IOPAMIDOL (ISOVUE-300) INJECTION 61%
100.0000 mL | Freq: Once | INTRAVENOUS | Status: AC | PRN
  Administered 2018-03-22: 100 mL via INTRAVENOUS

## 2018-03-22 MED ORDER — PANTOPRAZOLE SODIUM 40 MG IV SOLR
40.0000 mg | Freq: Every day | INTRAVENOUS | Status: DC
  Administered 2018-03-22 – 2018-03-23 (×2): 40 mg via INTRAVENOUS
  Filled 2018-03-22 (×2): qty 40

## 2018-03-22 MED ORDER — ONDANSETRON HCL 4 MG/2ML IJ SOLN
INTRAMUSCULAR | Status: DC | PRN
  Administered 2018-03-22: 4 mg via INTRAVENOUS

## 2018-03-22 MED ORDER — OXYCODONE HCL 5 MG PO TABS
5.0000 mg | ORAL_TABLET | Freq: Once | ORAL | Status: AC | PRN
  Administered 2018-03-22: 5 mg via ORAL

## 2018-03-22 MED ORDER — METRONIDAZOLE IN NACL 5-0.79 MG/ML-% IV SOLN
500.0000 mg | Freq: Once | INTRAVENOUS | Status: AC
  Administered 2018-03-22: 500 mg via INTRAVENOUS

## 2018-03-22 MED ORDER — FENTANYL CITRATE (PF) 250 MCG/5ML IJ SOLN
INTRAMUSCULAR | Status: AC
  Filled 2018-03-22: qty 5

## 2018-03-22 MED ORDER — IBUPROFEN 800 MG PO TABS: 800.0000 mg | ORAL_TABLET | Freq: Three times a day (TID) | ORAL | 0 refills | Status: DC | PRN

## 2018-03-22 MED ORDER — HYDROMORPHONE HCL 1 MG/ML IJ SOLN
0.5000 mg | INTRAMUSCULAR | Status: DC | PRN
  Administered 2018-03-23 – 2018-03-24 (×3): 0.5 mg via INTRAVENOUS
  Filled 2018-03-22 (×4): qty 1

## 2018-03-22 MED ORDER — ONDANSETRON HCL 4 MG/2ML IJ SOLN: 4.0000 mg | Freq: Once | INTRAMUSCULAR | Status: DC | PRN

## 2018-03-22 MED ORDER — HYDROMORPHONE HCL 1 MG/ML IJ SOLN: 0.2500 mg | INTRAMUSCULAR | Status: DC | PRN

## 2018-03-22 MED ORDER — PROPOFOL 10 MG/ML IV BOLUS
INTRAVENOUS | Status: AC
  Filled 2018-03-22: qty 20

## 2018-03-22 MED ORDER — IBUPROFEN 400 MG PO TABS
600.0000 mg | ORAL_TABLET | Freq: Once | ORAL | Status: AC
  Administered 2018-03-22: 600 mg via ORAL
  Filled 2018-03-22: qty 1

## 2018-03-22 MED ORDER — SUCCINYLCHOLINE 20MG/ML (10ML) SYRINGE FOR MEDFUSION PUMP - OPTIME
INTRAMUSCULAR | Status: DC | PRN
  Administered 2018-03-22: 140 mg via INTRAVENOUS

## 2018-03-22 MED ORDER — LIDOCAINE HCL (CARDIAC) 20 MG/ML IV SOLN
INTRAVENOUS | Status: DC | PRN
  Administered 2018-03-22: 100 mg via INTRAVENOUS

## 2018-03-22 MED ORDER — SODIUM CHLORIDE 0.9 % IV SOLN
INTRAVENOUS | Status: DC
  Administered 2018-03-22: 19:00:00 via INTRAVENOUS

## 2018-03-22 MED ORDER — HYDROMORPHONE HCL 1 MG/ML IJ SOLN
0.2500 mg | INTRAMUSCULAR | Status: DC | PRN
  Administered 2018-03-22 (×3): 0.5 mg via INTRAVENOUS

## 2018-03-22 MED ORDER — ONDANSETRON 4 MG PO TBDP: 4.0000 mg | ORAL_TABLET | Freq: Four times a day (QID) | ORAL | Status: DC | PRN

## 2018-03-22 MED ORDER — OXYCODONE HCL 5 MG/5ML PO SOLN: 5.0000 mg | Freq: Once | ORAL | Status: AC | PRN

## 2018-03-22 MED ORDER — CIPROFLOXACIN IN D5W 400 MG/200ML IV SOLN
400.0000 mg | Freq: Two times a day (BID) | INTRAVENOUS | Status: DC
  Administered 2018-03-23 (×2): 400 mg via INTRAVENOUS
  Filled 2018-03-22 (×3): qty 200

## 2018-03-22 MED ORDER — METRONIDAZOLE IN NACL 5-0.79 MG/ML-% IV SOLN
500.0000 mg | INTRAVENOUS | Status: AC
  Administered 2018-03-23: 500 mg via INTRAVENOUS
  Filled 2018-03-22: qty 100

## 2018-03-22 MED ORDER — ATENOLOL 50 MG PO TABS
50.0000 mg | ORAL_TABLET | Freq: Every day | ORAL | Status: DC
  Administered 2018-03-23 – 2018-03-24 (×2): 50 mg via ORAL
  Filled 2018-03-22 (×2): qty 1

## 2018-03-22 MED ORDER — 0.9 % SODIUM CHLORIDE (POUR BTL) OPTIME
TOPICAL | Status: DC | PRN
  Administered 2018-03-22: 1000 mL

## 2018-03-22 SURGICAL SUPPLY — 31 items
CANISTER SUCT 3000ML PPV (MISCELLANEOUS) ×2 IMPLANT
CLEANER TIP ELECTROSURG 2X2 (MISCELLANEOUS) ×2 IMPLANT
COVER SURGICAL LIGHT HANDLE (MISCELLANEOUS) ×2 IMPLANT
DRSG PAD ABDOMINAL 8X10 ST (GAUZE/BANDAGES/DRESSINGS) ×2 IMPLANT
ELECT REM PT RETURN 9FT ADLT (ELECTROSURGICAL) ×2
ELECTRODE REM PT RTRN 9FT ADLT (ELECTROSURGICAL) ×1 IMPLANT
GAUZE PACKING IODOFORM 1 (PACKING) IMPLANT
GAUZE PACKING IODOFORM 1/4X15 (GAUZE/BANDAGES/DRESSINGS) ×1 IMPLANT
GAUZE SPONGE 4X4 12PLY STRL (GAUZE/BANDAGES/DRESSINGS) ×2 IMPLANT
GAUZE SPONGE 4X4 16PLY XRAY LF (GAUZE/BANDAGES/DRESSINGS) ×2 IMPLANT
GLOVE BIOGEL PI IND STRL 8 (GLOVE) ×1 IMPLANT
GLOVE BIOGEL PI INDICATOR 8 (GLOVE) ×1
GLOVE ECLIPSE 7.5 STRL STRAW (GLOVE) ×2 IMPLANT
GOWN STRL REUS W/ TWL LRG LVL3 (GOWN DISPOSABLE) ×2 IMPLANT
GOWN STRL REUS W/TWL LRG LVL3 (GOWN DISPOSABLE) ×4
KIT BASIN OR (CUSTOM PROCEDURE TRAY) ×2 IMPLANT
KIT TURNOVER KIT B (KITS) ×2 IMPLANT
NS IRRIG 1000ML POUR BTL (IV SOLUTION) ×2 IMPLANT
PACK LITHOTOMY IV (CUSTOM PROCEDURE TRAY) ×2 IMPLANT
PAD ABD 8X10 STRL (GAUZE/BANDAGES/DRESSINGS) ×1 IMPLANT
PAD ARMBOARD 7.5X6 YLW CONV (MISCELLANEOUS) ×2 IMPLANT
PENCIL BUTTON HOLSTER BLD 10FT (ELECTRODE) ×2 IMPLANT
SURGILUBE 2OZ TUBE FLIPTOP (MISCELLANEOUS) ×1 IMPLANT
SWAB COLLECTION DEVICE MRSA (MISCELLANEOUS) IMPLANT
SWAB CULTURE ESWAB REG 1ML (MISCELLANEOUS) IMPLANT
SYR BULB IRRIGATION 50ML (SYRINGE) ×1 IMPLANT
TOWEL OR 17X24 6PK STRL BLUE (TOWEL DISPOSABLE) ×1 IMPLANT
TOWEL OR 17X26 10 PK STRL BLUE (TOWEL DISPOSABLE) ×2 IMPLANT
TUBE CONNECTING 12X1/4 (SUCTIONS) ×2 IMPLANT
UNDERPAD 30X30 (UNDERPADS AND DIAPERS) ×2 IMPLANT
YANKAUER SUCT BULB TIP NO VENT (SUCTIONS) ×2 IMPLANT

## 2018-03-22 NOTE — Transfer of Care (Signed)
Immediate Anesthesia Transfer of Care Note  Patient: Martin Rose  Procedure(s) Performed: IRRIGATION AND DEBRIDEMENT PERIRECTAL ABSCESS (Left )  Patient Location: PACU  Anesthesia Type:General  Level of Consciousness: awake, alert , oriented and patient cooperative  Airway & Oxygen Therapy: Patient Spontanous Breathing and Patient connected to nasal cannula oxygen  Post-op Assessment: Report given to RN, Post -op Vital signs reviewed and stable and Patient moving all extremities X 4  Post vital signs: Reviewed and stable  Last Vitals:  Vitals Value Taken Time  BP 99/80 03/22/2018  9:38 PM  Temp 36.2 C 03/22/2018  9:37 PM  Pulse 93 03/22/2018  9:41 PM  Resp 20 03/22/2018  9:41 PM  SpO2 100 % 03/22/2018  9:41 PM  Vitals shown include unvalidated device data.  Last Pain:  Vitals:   03/22/18 2137  TempSrc:   PainSc: 0-No pain         Complications: No apparent anesthesia complications

## 2018-03-22 NOTE — H&P (Signed)
Martin Rose is an 42 y.o. male.   Chief Complaint: Perirectal and perianal pain. HPI: Several days ago ate some chicken and developed some perirectal pain.  Felt a chicken bone coming out of anu or perianal area.  Subsequently developed abscess was seen in our office and sent to the ED because this could not be controlled in the office.  For I&D in the OR.  Past Medical History:  Diagnosis Date  . Hypertension     History reviewed. No pertinent surgical history.  Family History  Problem Relation Age of Onset  . Hypertension Mother   . Hypertension Father   . Cancer Sister 30       brain tumor  . Stroke Sister    Social History:  reports that he has quit smoking. He has never used smokeless tobacco. He reports that he has current or past drug history. Drug: Marijuana. His alcohol history is not on file.  Allergies: No Known Allergies   (Not in a hospital admission)  Results for orders placed or performed during the hospital encounter of 03/22/18 (from the past 48 hour(s))  Comprehensive metabolic panel     Status: Abnormal   Collection Time: 03/22/18  3:10 PM  Result Value Ref Range   Sodium 141 135 - 145 mmol/L   Potassium 4.6 3.5 - 5.1 mmol/L   Chloride 103 101 - 111 mmol/L   CO2 27 22 - 32 mmol/L   Glucose, Bld 101 (H) 65 - 99 mg/dL   BUN 9 6 - 20 mg/dL   Creatinine, Ser 1.21 0.61 - 1.24 mg/dL   Calcium 9.5 8.9 - 10.3 mg/dL   Total Protein 7.5 6.5 - 8.1 g/dL   Albumin 3.7 3.5 - 5.0 g/dL   AST 18 15 - 41 U/L   ALT 18 17 - 63 U/L   Alkaline Phosphatase 84 38 - 126 U/L   Total Bilirubin 0.7 0.3 - 1.2 mg/dL   GFR calc non Af Amer >60 >60 mL/min   GFR calc Af Amer >60 >60 mL/min    Comment: (NOTE) The eGFR has been calculated using the CKD EPI equation. This calculation has not been validated in all clinical situations. eGFR's persistently <60 mL/min signify possible Chronic Kidney Disease.    Anion gap 11 5 - 15    Comment: Performed at [facility] [address] [place], [place] [address]  CBC with Differential     Status: Abnormal   Collection Time: 03/22/18  3:10 PM  Result Value Ref Range   WBC 14.6 (H) 4.0 - 10.5 K/uL   RBC 4.93 4.22 - 5.81 MIL/uL   Hemoglobin 13.5 13.0 - 17.0 g/dL   HCT 41.3 39.0 - 52.0 %   MCV 83.8 78.0 - 100.0 fL   MCH 27.4 26.0 - 34.0 pg   MCHC 32.7 30.0 - 36.0 g/dL   RDW 14.2 11.5 - 15.5 %   Platelets 301 150 - 400 K/uL   Neutrophils Relative % 67 %   Neutro Abs 9.7 (H) 1.7 - 7.7 K/uL   Lymphocytes Relative 25 %   Lymphs Abs 3.6 0.7 - 4.0 K/uL   Monocytes Relative 6 %   Monocytes Absolute 0.9 0.1 - 1.0 K/uL   Eosinophils Relative 2 %   Eosinophils Absolute 0.3 0.0 - 0.7 K/uL   Basophils Relative 0 %   Basophils Absolute 0.0 0.0 - 0.1 K/uL    Comment: Performed at [facility] [place],  [place] [address]   Ct Pelvis W Contrast  Result Date: 03/22/2018 CLINICAL DATA:  Rectal abscess for 3 days. EXAM: CT PELVIS WITH CONTRAST TECHNIQUE: Multidetector CT imaging of the pelvis was performed using the standard protocol following the bolus administration of intravenous contrast. CONTRAST:  134m ISOVUE-300 IOPAMIDOL (ISOVUE-300) INJECTION 61% COMPARISON:  None. FINDINGS: Urinary Tract:  No abnormality visualized. Bowel: Intrapelvic portion of the bowel appears normal. No perirectal abscess collection. No bowel wall thickening or evidence of bowel wall inflammation. There is perianal soft tissue thickening, to the LEFT of midline, with central fluid component, compatible with perianal abscess. The overall thickening measures approximately 4.5 by 1.5 cm. The small central fluid component measures approximately 8 mm greatest dimension. Again, no superior extension to the perirectal space. Vascular/Lymphatic: Vascular structures are unremarkable. No enlarged or morphologically abnormal lymph nodes identified. Reproductive:  No mass or other significant abnormality Other:  Superficial soft tissues  otherwise unremarkable. Musculoskeletal: No acute or suspicious osseous finding. IMPRESSION: 1. Small superficial perianal abscess collection. Most prominent component is soft tissue thickening/edema which measures approximately 4.5 cm greatest AP extent and approximately 1.5 cm greatest thickness. The small internal fluid component measures only 8 mm greatest dimension. 2. No perirectal abscess or inflammatory change. Intrapelvic portion of the bowel appears normal. Electronically Signed   By: SFranki CabotM.D.   On: 03/22/2018 16:09    Review of Systems  Constitutional: Negative for chills and fever.  Genitourinary:       Perirectal and perianal pain  All other systems reviewed and are negative.   Blood pressure 129/72, pulse 62, temperature 98.1 F (36.7 C), temperature source Oral, resp. rate 18, SpO2 100 %. Physical Exam  Vitals reviewed. Constitutional: He is oriented to person, place, and time. He appears well-developed and well-nourished.  Large man  HENT:  Head: Normocephalic and atraumatic.  Eyes: Pupils are equal, round, and reactive to light. Conjunctivae and EOM are normal.  Cardiovascular: Normal rate and regular rhythm.  Respiratory: Effort normal and breath sounds normal.  GI: Soft. Bowel sounds are normal.  Genitourinary: Prostate normal and penis normal. Rectal exam shows tenderness.     Musculoskeletal: Normal range of motion.  Neurological: He is alert and oriented to person, place, and time. He has normal reflexes.  Skin: Skin is warm and dry.  Psychiatric: He has a normal mood and affect. His behavior is normal. Judgment and thought content normal.     Assessment/Plan Perirectal abscess, possibly secondary to perfration from chicken bone.  JJudeth Horn MD 03/22/2018, 6:33 PM

## 2018-03-22 NOTE — Addendum Note (Signed)
Encounter addended by: Tamera Reasonlark, Lamin Chandley P, RPH on: 03/22/2018 7:14 PM  Actions taken: Order Reconciliation Section accessed

## 2018-03-22 NOTE — ED Notes (Signed)
Surgery MD at bedside.

## 2018-03-22 NOTE — Patient Instructions (Addendum)
Good to see you today!  Thanks for coming in.  We will refer you to surgery clinic - We will call you about when and how   If you have high fever or feel very ill you need to go to the ER immediately  Use the ibuprofen three times a day and take tylenol with it

## 2018-03-22 NOTE — Anesthesia Preprocedure Evaluation (Addendum)
Anesthesia Evaluation  Patient identified by MRN, date of birth, ID band Patient awake    Reviewed: Allergy & Precautions, NPO status , Patient's Chart, lab work & pertinent test results  Airway Mallampati: III  TM Distance: >3 FB Neck ROM: Full    Dental  (+) Teeth Intact, Dental Advisory Given   Pulmonary former smoker,    breath sounds clear to auscultation       Cardiovascular hypertension,  Rhythm:Regular Rate:Normal     Neuro/Psych    GI/Hepatic   Endo/Other    Renal/GU      Musculoskeletal   Abdominal (+) + obese,   Peds  Hematology   Anesthesia Other Findings   Reproductive/Obstetrics                             Anesthesia Physical Anesthesia Plan  ASA: III and emergent  Anesthesia Plan: General   Post-op Pain Management:    Induction: Intravenous  PONV Risk Score and Plan: Ondansetron and Dexamethasone  Airway Management Planned: Oral ETT  Additional Equipment:   Intra-op Plan:   Post-operative Plan: Extubation in OR  Informed Consent: I have reviewed the patients History and Physical, chart, labs and discussed the procedure including the risks, benefits and alternatives for the proposed anesthesia with the patient or authorized representative who has indicated his/her understanding and acceptance.   Dental advisory given  Plan Discussed with: CRNA and Anesthesiologist  Anesthesia Plan Comments:        Anesthesia Quick Evaluation

## 2018-03-22 NOTE — Op Note (Signed)
OPERATIVE REPORT  DATE OF OPERATION: 03/22/2018  PATIENT:  Martin Rose  42 y.o. male  PRE-OPERATIVE DIAGNOSIS:  Perirectal abscess  POST-OPERATIVE DIAGNOSIS:  Perirectal abscess  INDICATION(S) FOR OPERATION: Presence of a previously draining perirectal abscess  FINDINGS: Partially drained moderate size left-sided perirectal abscess  PROCEDURE:  Procedure(s): IRRIGATION AND DEBRIDEMENT PERIRECTAL ABSCESS  SURGEON:  Surgeon(s): Jimmye NormanWyatt, Martin Califano, MD  ASSISTANT: None  ANESTHESIA:   general  COMPLICATIONS:  none  EBL: Less than 20 ml  BLOOD ADMINISTERED: none  DRAINS: none and Wound was packed with an entire bottle   SPECIMEN:  No Specimen  COUNTS CORRECT:  YES  PROCEDURE DETAILS: Patient was taken to the operating room and placed on the table initially in the supine position.  After an adequate general endotracheal anesthetic was administered, he was placed in the lithotomy position and prepped and draped in usual sterile manner.  Proper timeout was performed identifying the patient and the procedure to be performed.  We spread the gluteal cheeks widened on the left side of the anterior perirectal area there was a 2-3 cm swollen area that was partially necrotic and draining pus.  Used a 15 blade to slightly enlarged the incision and subsequently used Kelly clamps to enlarge the opening of the abscess cavity which extended proximally towards the left side of the perirectal area.  We opened it and spread out with Kelly clamps and subsequently irrigated with saline solution.  We then packed with an entire bottle of quarter inch iodoform Nu Gauze.  A sterile dressing was applied.  All needle counts, sponge counts, and instrument counts were correct.  PATIENT DISPOSITION:  PACU - hemodynamically stable.   Jimmye NormanJames Keymon Mcelroy 4/10/20199:38 PM

## 2018-03-22 NOTE — Progress Notes (Signed)
Subjective  Patient is presenting with the following illnesses  LEFT RECTAL PAIN SWELLING Pain and swelling began about 4 days ago and has worsened until last PM when is burst and now feels better but still very painful.  Was seen in UC and waited in ER but was not seen on 4/8.   Gives a history of a few weeks ago of removing a chicken bone from his rectal area.  Has not had fevers or lightheadness or felt overall bad.   Has history of boils    Chief Complaint noted Review of Symptoms - see HPI PMH - Smoking status noted.     Objective Vital Signs reviewed Moderate discomfort Awake interactive ambulatoru Obese large individual  Patient has large diffuse area of tenderness and redness and soft tissue swelling to the left of his rectal area which was not well visualized due to pain with retraction .   Brown whitish possibly feculant discharge around rectal area. Normal scrotum testis and penis     Assessments/Plans  Rectal abscess Acute.  Seems to have started draining today.  No systemic symptoms but with history of foreign body (chicken bone) and given severe pain may need exploration/drainage under anesthesia.  Will refer to surgery.  Since they can see this afternoon will not start antibiotics now .  Patient does not wish narcotic analgesics     See after visit summary for details of patient instuctions

## 2018-03-22 NOTE — ED Triage Notes (Signed)
Pt to ER for further evaluation of rectal abscess with concern for perirectal abscess, patient reports is able to use the restroom, however unable to sit and states the abscess is inside his rectum.

## 2018-03-22 NOTE — H&P (Signed)
CC: Perianal pain - urgent office visit  HPI: Martin Rose is a pleasant 41yoM with hx of HTN - he developed acute onset anal pain that was sharp and constant beginning 4d ago and associated chills. No fevers. The pain progressed over the last 3 days and beginning last night he noted no deep purulent drainage from the perianal area. His pain improved slightly but has persisted. Given this, he called our office for further instructions and evaluation. He did report to the urgent care emergency room on 03/20/18, concerning for abscess and was instructed to go to ER for further evaluation - wait was too long so he went home.   PMH: HTN well controlled on oral antihypertensive  PSH: Denies any prior anorectal or abdominal procedures  FHx: Denies FHx of malignancy  Social: Denies use of tobacco. Social EtOH use. Smokes marijuana.  ROS: A comprehensive 10 system review of systems was completed with the patient and pertinent findings as noted above.   Past Surgical History Martin Rose, Martin Rose; 03/22/2018 1:29 PM) No pertinent past surgical history   Diagnostic Studies History Martin Rose, Martin Rose; 03/22/2018 1:29 PM) Colonoscopy  never  Allergies Martin Rose, RMA; 03/22/2018 1:30 PM) No Known Drug Allergies [03/22/2018]: Allergies Reconciled   Medication History Martin Rose, Martin Rose; 03/22/2018 1:30 PM) AmLODIPine Besylate (10MG  Tablet, Oral) Active. Atenolol (50MG  Tablet, Oral) Active. Medications Reconciled  Social History Martin Rose, Martin Rose; 03/22/2018 1:29 PM) Alcohol use  Moderate alcohol use. Caffeine use  Carbonated beverages. Illicit drug use  Uses socially only. Tobacco use  Former smoker.  Family History Martin Rose, Martin Rose; 03/22/2018 1:29 PM) First Degree Relatives  No pertinent family history   Other Problems Martin Rose, RMA; 03/22/2018 1:29 PM) Back Pain  High blood pressure     Review of Systems Martin Deer M. Bain Whichard MD; 03/22/2018 1:58  PM) General Present- Chills. Not Present- Fever. HEENT Not Present- Blurred Vision and Headache. Neck Not Present- Neck Mass and Neck Pain. Respiratory Not Present- Cough and Difficulty Breathing on Exertion. Cardiovascular Not Present- Chest Pain, Difficulty Breathing Lying Down, Leg Cramps, Palpitations, Rapid Heart Rate, Shortness of Breath and Swelling of Extremities. Gastrointestinal Not Present- Abdominal Pain, Bloating, Bloody Stool, Change in Bowel Habits, Chronic diarrhea, Constipation, Difficulty Swallowing, Excessive gas, Gets full quickly at meals, Hemorrhoids, Indigestion, Nausea, Rectal Pain and Vomiting. Musculoskeletal Not Present- Back Pain, Joint Pain, Joint Stiffness, Muscle Pain, Muscle Weakness and Swelling of Extremities. Psychiatric Not Present- Anxiety and Depression. Endocrine Not Present- Appetite Changes and Cold Intolerance. Hematology Not Present- Blood Thinners, Easy Bruising, Excessive bleeding, Gland problems, HIV and Persistent Infections.  Vitals Martin Rose RMA; 03/22/2018 1:30 PM) 03/22/2018 1:29 PM Weight: 281.4 lb Height: 71in Body Surface Area: 2.44 m Body Mass Index: 39.25 kg/m  Temp.: 98.42F  Pulse: 97 (Regular)  BP: 135/78 (Sitting, Left Arm, Standard)       Physical Exam Martin Deer M. Rhodes Calvert MD; 03/22/2018 2:00 PM) The physical exam findings are as follows: Note:Constitutional: No acute distress; conversant; no deformities Eyes: Moist conjunctiva; no lid lag; anicteric sclerae; pupils equal round and reactive to light Neck: Trachea midline; no palpable thyromegaly Lungs: Normal respiratory effort; no tactile fremitus CV: Regular rate and rhythm; no palpable thrill; no pitting edema GI: Abdomen soft, nontender, nondistended; no palpable hepatosplenomegaly Anorectal: L gluteal region exquisitely tender; induration and erythema to significant portion of left buttock; small punctate area draining purulent fluid left perianal area.  Unable to tolerate DRE or anoscopy. MSK: Normal gait; no clubbing/cyanosis Psychiatric: Appropriate affect; alert  and oriented 3 Lymphatic: No palpable cervical or axillary lymphadenopathy    Assessment & Plan   Impression: [name] is a pleasant 41yoM with hx HTN here today with draining perianal/perirectal abscess -Exquisite tenderness despite appearent drainage raises concern for underlying undrained abscess. Will not tolerate wound exploration in office under local. -I have recommended he proceed to ED for further evaluation - Labs and CT pelvis with contrast if Cr ok. If unadressed abscess, will likely need I&D/EUA  Martin CoupChristopher M. Cliffton Rose, M.D. General and Colorectal Surgery [facility]

## 2018-03-22 NOTE — ED Provider Notes (Signed)
Patient placed in Quick Look pathway, seen and evaluated   Chief Complaint rectal abscess  HPI:   Pain began 3 days ago to the rectum. Started draining yesterday. Went to PCP and was sent to [facility], Dr. Cliffton AstersWhite, sent here for labs and CT pelvis. No fever or chills. No abdominal pain. No hx of the same.   ROS: rectal pain.   Physical Exam:   Gen: No distress  Neuro: Awake and Alert  Skin: Warm    Focused Exam: no abdominal tenderness  Labs and CT pelvis ordered   Initiation of care has begun. The patient has been counseled on the process, plan, and necessity for staying for the completion/evaluation, and the remainder of the medical screening examination    Jaynie CrumbleKirichenko, Rosalba Totty, PA-C 03/22/18 1938    Melene PlanFloyd, Dan, DO 03/22/18 2258

## 2018-03-22 NOTE — Anesthesia Procedure Notes (Signed)
Procedure Name: Intubation Date/Time: 03/22/2018 8:56 PM Performed by: Claris Che, CRNA Pre-anesthesia Checklist: Patient identified, Emergency Drugs available, Suction available, Patient being monitored and Timeout performed Patient Re-evaluated:Patient Re-evaluated prior to induction Oxygen Delivery Method: Circle system utilized Preoxygenation: Pre-oxygenation with 100% oxygen Induction Type: IV induction, Cricoid Pressure applied and Rapid sequence Laryngoscope Size: Mac and 4 Grade View: Grade II Tube type: Oral Tube size: 7.5 mm Number of attempts: 1 Airway Equipment and Method: Stylet Placement Confirmation: ETT inserted through vocal cords under direct vision,  positive ETCO2 and breath sounds checked- equal and bilateral Secured at: 24 cm Tube secured with: Tape Dental Injury: Teeth and Oropharynx as per pre-operative assessment

## 2018-03-22 NOTE — Assessment & Plan Note (Addendum)
Acute.  Seems to have started draining today.  No systemic symptoms but with history of foreign body (chicken bone) and given severe pain may need exploration/drainage under anesthesia.  Will refer to surgery.  Since they can see this afternoon will not start antibiotics now .  Patient does not wish narcotic analgesics

## 2018-03-22 NOTE — ED Provider Notes (Signed)
[facility]  SURGICAL Provider Note   CSN: [id number] Arrival date & time: 03/22/18  1409     History   Chief Complaint Chief Complaint  Patient presents with  . Rectal Pain    HPI Martin Rose is a 42 y.o. male.  HPI   Patient is a 42 year old male with a history of hypertension and hydradenitis who is presenting with rectal pain that began 4 days ago.  States he has a wound to the left buttock area near the anus.  Initially pain was 10/10 and constant, but wound started draining last night around 3 AM and since then pain has improved.  Pain is worse with walking and sitting.  He has had no fevers, nausea, vomiting, abdominal pain, or urinary symptoms.  He is able to have bowel movements though they are painful.  He has not tried any medications for his symptoms.  He was seen by his primary care provider this morning and was referred to general surgery office.  He was seen at general surgery office and was advised to come to the ED for further evaluation.  States he never had symptoms like this before.  Past Medical History:  Diagnosis Date  . Hypertension     Patient Active Problem List   Diagnosis Date Noted  . Peri-rectal abscess 03/23/2018  . Rectal abscess 03/22/2018  . Perirectal abscess 03/22/2018  . Palpitations 07/11/2017  . Alcohol use 07/11/2017  . Allergic rhinitis 06/24/2015  . Morbid obesity (HCC) 04/30/2013  . Snoring 04/30/2013  . Hidradenitis 09/04/2012  . Essential hypertension, benign 03/10/2009    Past Surgical History:  Procedure Laterality Date  . INCISION AND DRAINAGE PERIRECTAL ABSCESS  03/22/2018  . INCISION AND DRAINAGE PERIRECTAL ABSCESS Left 03/22/2018   Procedure: IRRIGATION AND DEBRIDEMENT PERIRECTAL ABSCESS;  Surgeon: Jimmye Norman, MD;  [facility]: [facility] OR;  Service: General;  Laterality: Left;        Home Medications    Prior to Admission medications   Medication Sig Start Date End Date Taking? Authorizing  Provider  amLODipine (NORVASC) 10 MG tablet Take 1 tablet (10 mg total) by mouth daily. 09/06/17  Yes Bland, Scott, DO  atenolol (TENORMIN) 50 MG tablet Take 1 tablet (50 mg total) by mouth daily. 09/06/17  Yes Marthenia Rolling, DO  hydrocortisone (ANUSOL-HC) 2.5 % rectal cream Place 1 application rectally 2 (two) times daily. x7 days Patient taking differently: Apply 1 application topically as directed.  08/09/16  Yes Gottschalk, Kathie Rhodes M, DO  ibuprofen (ADVIL,MOTRIN) 200 MG tablet Take 800 mg by mouth every 6 (six) hours as needed (for pain).   Yes [provider]  ibuprofen (ADVIL,MOTRIN) 800 MG tablet Take 1 tablet (800 mg total) by mouth every 8 (eight) hours as needed. Patient not taking: Reported on 03/22/2018 03/22/18   Carney Living, MD    Family History Family History  Problem Relation Age of Onset  . Hypertension Mother   . Hypertension Father   . Cancer Sister 30       brain tumor  . Stroke Sister     Social History Social History   Tobacco Use  . Smoking status: Former Smoker    Packs/day: 0.50    Years: 15.00    Pack years: 7.50    Types: Cigarettes    Last attempt to quit: 2010    Years since quitting: 9.2  . Smokeless tobacco: Never Used  Substance Use Topics  . Alcohol use: Yes  Alcohol/week: 14.4 oz    Types: 24 Cans of beer per week  . Drug use: Yes    Types: Marijuana    Comment: 03/22/2018 "2-3 times/wk"     Allergies   Patient has no known allergies.   Review of Systems Review of Systems  Constitutional: Negative for fever.  HENT: Negative for congestion.   Respiratory: Negative for shortness of breath.   Cardiovascular: Negative for chest pain.  Gastrointestinal: Negative for abdominal pain, constipation, diarrhea, nausea and vomiting.  Genitourinary: Negative for flank pain and frequency.  Musculoskeletal: Negative for back pain.  Skin: Positive for wound.  Neurological: Negative for light-headedness and headaches.      Physical Exam Updated Vital Signs BP 131/85   Pulse 76   Temp 97.7 F (36.5 C) (Oral)   Resp 19   Ht 5\' 10"  (1.778 m)   Wt 127.5 kg (281 lb)   SpO2 100%   BMI 40.32 kg/m   Physical Exam  Constitutional: He appears well-developed and well-nourished.  HENT:  Head: Normocephalic and atraumatic.  Eyes: Conjunctivae are normal.  Neck: Neck supple.  Cardiovascular: Normal rate, regular rhythm and normal heart sounds.  No murmur heard. Pulmonary/Chest: Effort normal and breath sounds normal. No respiratory distress. He has no wheezes.  Abdominal: Soft. Bowel sounds are normal. He exhibits no distension. There is no tenderness.  Genitourinary:  Genitourinary Comments: Chaperone present during evaluation. Pt has ttp and induration to right buttock just lateral to rectum that extends up to the sacrum. There is a central area that is open and draining purulent fluid.   Musculoskeletal: He exhibits no edema.  Neurological: He is alert.  Skin: Skin is warm and dry.  Psychiatric: He has a normal mood and affect.  Nursing note and vitals reviewed.    ED Treatments / Results  Labs (all labs ordered are listed, but only abnormal results are displayed) Labs Reviewed  COMPREHENSIVE METABOLIC PANEL - Abnormal; Notable for the following components:      Result Value   Glucose, Bld 101 (*)    All other components within normal limits  CBC WITH DIFFERENTIAL/PLATELET - Abnormal; Notable for the following components:   WBC 14.6 (*)    Neutro Abs 9.7 (*)    All other components within normal limits  BASIC METABOLIC PANEL - Abnormal; Notable for the following components:   Glucose, Bld 143 (*)    All other components within normal limits  CBC - Abnormal; Notable for the following components:   WBC 10.8 (*)    All other components within normal limits    EKG None  Radiology Ct Pelvis W Contrast  Result Date: 03/22/2018 CLINICAL DATA:  Rectal abscess for 3 days. EXAM: CT PELVIS  WITH CONTRAST TECHNIQUE: Multidetector CT imaging of the pelvis was performed using the standard protocol following the bolus administration of intravenous contrast. CONTRAST:  ISOVUE-300 IOPAMIDOL (ISOVUE-300) INJECTION 61% COMPARISON:  None. FINDINGS: Urinary Tract:  No abnormality visualized. Bowel: Intrapelvic portion of the bowel appears normal. No perirectal abscess collection. No bowel wall thickening or evidence of bowel wall inflammation. There is perianal soft tissue thickening, to the LEFT of midline, with central fluid component, compatible with perianal abscess. The overall thickening measures approximately 4.5 by 1.5 cm. The small central fluid component measures approximately 8 mm greatest dimension. Again, no superior extension to the perirectal space. Vascular/Lymphatic: Vascular structures are unremarkable. No enlarged or morphologically abnormal lymph nodes identified. Reproductive:  No mass or other significant abnormality Other:  Superficial soft tissues otherwise unremarkable. Musculoskeletal: No acute or suspicious osseous finding. IMPRESSION: 1. Small superficial perianal abscess collection. Most prominent component is soft tissue thickening/edema which measures approximately 4.5 cm greatest AP extent and approximately 1.5 cm greatest thickness. The small internal fluid component measures only 8 mm greatest dimension. 2. No perirectal abscess or inflammatory change. Intrapelvic portion of the bowel appears normal. Electronically Signed   By: Bary RichardStan  Maynard M.D.   On: 03/22/2018 16:09    Procedures Procedures (including critical care time)  Medications Ordered in ED Medications  iopamidol (ISOVUE-300) 61 % injection (has no administration in time range)  0.9 %  sodium chloride infusion ( Intravenous Transfusing/Transfer 03/22/18 1902)  lactated ringers infusion ( Intravenous Anesthesia Volume Adjustment 03/22/18 2131)  amLODipine (NORVASC) tablet 10 mg (10 mg Oral Given 03/23/18  1027)  atenolol (TENORMIN) tablet 50 mg (50 mg Oral Given 03/23/18 1027)  enoxaparin (LOVENOX) injection 40 mg (has no administration in time range)  0.9 % NaCl with KCl 20 mEq/ L  infusion ( Intravenous New Bag/Given 03/23/18 1021)  ciprofloxacin (CIPRO) IVPB 400 mg (0 mg Intravenous Stopped 03/23/18 1130)  metroNIDAZOLE (FLAGYL) IVPB 500 mg (0 mg Intravenous Duplicate 03/23/18 0636)  HYDROmorphone (DILAUDID) injection 0.5 mg (0.5 mg Intravenous Given 03/23/18 0635)  oxyCODONE (Oxy IR/ROXICODONE) immediate release tablet 5-10 mg (10 mg Oral Given 03/23/18 0542)  acetaminophen (TYLENOL) tablet 1,000 mg (1,000 mg Oral Given 03/23/18 1231)  pantoprazole (PROTONIX) injection 40 mg (40 mg Intravenous Given 03/22/18 2256)  ondansetron (ZOFRAN-ODT) disintegrating tablet 4 mg (has no administration in time range)    Or  ondansetron (ZOFRAN) injection 4 mg (has no administration in time range)  HYDROmorphone (DILAUDID) 1 MG/ML injection (has no administration in time range)  HYDROmorphone (DILAUDID) 1 MG/ML injection (has no administration in time range)  oxyCODONE (Oxy IR/ROXICODONE) 5 MG immediate release tablet (has no administration in time range)  iopamidol (ISOVUE-300) 61 % injection 100 mL (100 mLs Intravenous Contrast Given 03/22/18 1548)  ibuprofen (ADVIL,MOTRIN) tablet 600 mg (600 mg Oral Given 03/22/18 1816)  ciprofloxacin (CIPRO) IVPB 400 mg ( Intravenous MAR Unhold 03/22/18 2225)  metroNIDAZOLE (FLAGYL) IVPB 500 mg ( Intravenous MAR Unhold 03/22/18 2225)  metroNIDAZOLE (FLAGYL) IVPB 500 mg (0 mg Intravenous Stopped 03/23/18 0646)  oxyCODONE (Oxy IR/ROXICODONE) immediate release tablet 5 mg (5 mg Oral Given 03/22/18 2209)    Or  oxyCODONE (ROXICODONE) 5 MG/5ML solution 5 mg ( Oral See Alternative 03/22/18 2209)  HYDROmorphone (DILAUDID) injection 1 mg (1 mg Intravenous Given 03/23/18 1024)     Initial Impression / Assessment and Plan / ED Course  I have reviewed the triage vital signs and the  nursing notes.  Pertinent labs & imaging results that were available during my care of the patient were reviewed by me and considered in my medical decision making (see chart for details).   CONSULT ordered for general surgery.   General surgery evaluated the patient and admitted them to their service for surgical debridement.   Final Clinical Impressions(s) / ED Diagnoses   Final diagnoses:  Perianal abscess   42 year old male presenting with rectal pain for 4 days.  Patient with tenderness and fluctuance noted to perirectal area on the right side with actively draining wound.   Vital signs are stable and patient is afebrile.  Lab work significant for leukocytosis to 14.6.  CMP is benign. CT pelvis with contrast revealed perianal abscess collection with no evidence of perirectal abscess or inflammatory change.  Patient given pain  medication in the ED and surgery consult was placed.  General surgery evaluated patient and admitted him to their service for surgical debridement of perianal abscess.   ED Discharge Orders    None       Karrie Meres, [place] 03/23/18 1401    [place], Cindee Salt, MD 03/25/18 1314

## 2018-03-23 ENCOUNTER — Encounter (HOSPITAL_COMMUNITY): Payer: Self-pay | Admitting: General Surgery

## 2018-03-23 DIAGNOSIS — K612 Anorectal abscess: Secondary | ICD-10-CM | POA: Diagnosis present

## 2018-03-23 DIAGNOSIS — Z6841 Body Mass Index (BMI) 40.0 and over, adult: Secondary | ICD-10-CM | POA: Diagnosis not present

## 2018-03-23 DIAGNOSIS — Z87891 Personal history of nicotine dependence: Secondary | ICD-10-CM | POA: Diagnosis not present

## 2018-03-23 DIAGNOSIS — I10 Essential (primary) hypertension: Secondary | ICD-10-CM | POA: Diagnosis present

## 2018-03-23 DIAGNOSIS — K611 Rectal abscess: Secondary | ICD-10-CM | POA: Diagnosis present

## 2018-03-23 LAB — BASIC METABOLIC PANEL
Anion gap: 8 (ref 5–15)
BUN: 10 mg/dL (ref 6–20)
CHLORIDE: 103 mmol/L (ref 101–111)
CO2: 25 mmol/L (ref 22–32)
CREATININE: 1.1 mg/dL (ref 0.61–1.24)
Calcium: 8.9 mg/dL (ref 8.9–10.3)
GFR calc Af Amer: >60 mL/min (ref >60)
GFR calc non Af Amer: >60 mL/min (ref >60)
Glucose, Bld: 143 mg/dL — ABNORMAL HIGH (ref 65–99)
Potassium: 5 mmol/L (ref 3.5–5.1)
SODIUM: 136 mmol/L (ref 135–145)

## 2018-03-23 LAB — CBC
HCT: 40 % (ref 39.0–52.0)
Hemoglobin: 13 g/dL (ref 13.0–17.0)
MCH: 27.1 pg (ref 26.0–34.0)
MCHC: 32.5 g/dL (ref 30.0–36.0)
MCV: 83.3 fL (ref 78.0–100.0)
PLATELETS: 299 10*3/uL (ref 150–400)
RBC: 4.8 MIL/uL (ref 4.22–5.81)
RDW: 14 % (ref 11.5–15.5)
WBC: 10.8 10*3/uL — AB (ref 4.0–10.5)

## 2018-03-23 MED ORDER — HYDROMORPHONE HCL 1 MG/ML IJ SOLN
1.0000 mg | Freq: Once | INTRAMUSCULAR | Status: AC
  Administered 2018-03-23: 1 mg via INTRAVENOUS
  Filled 2018-03-23: qty 1

## 2018-03-23 NOTE — Progress Notes (Signed)
[facility] Progress Note  1 Day Post-Op  Subjective: CC:  Having moderate pain in left buttock and perineal area that improved with pain medication. Has not mobilizing since surgery. Urinating without dysuria or hesitancy.  Objective: Vital signs in last 24 hours: Temp:  [97.2 F (36.2 C)-98.6 F (37 C)] 97.7 F (36.5 C) (04/11 0537) Pulse Rate:  [61-98] 70 (04/11 0537) Resp:  [16-23] 19 (04/11 0537) BP: (99-155)/(72-116) 122/79 (04/11 0537) SpO2:  [96 %-100 %] 100 % (04/11 0537) Weight:  [127.5 kg (281 lb)] 127.5 kg (281 lb) (04/10 2242) Last BM Date: 03/22/18  Intake/Output from previous day: 04/10 0701 - 04/11 0700 In: 2066.7 [P.O.:360; I.V.:1606.7; IV Piggyback:100] Out: 5 [Blood:5] Intake/Output this shift: No intake/output data recorded.  PE: Gen:  Alert, NAD, pleasant Pulm: normal effort Skin: iodoform packing removed from left perirectal incision - no active bleeding and patient tolerated this well. There is still some induration of the surround tissues.   Lab Results:  Recent Labs    03/22/18 1510 03/23/18 0653  WBC 14.6* 10.8*  HGB 13.5 13.0  HCT 41.3 40.0  PLT 301 299   BMET Recent Labs    03/22/18 1510  NA 141  K 4.6  CL 103  CO2 27  GLUCOSE 101*  BUN 9  CREATININE 1.21  CALCIUM 9.5   PT/INR No results for input(s): LABPROT, INR in the last 72 hours. CMP     Component Value Date/Time   NA 141 03/22/2018 1510   K 4.6 03/22/2018 1510   CL 103 03/22/2018 1510   CO2 27 03/22/2018 1510   GLUCOSE 101 (H) 03/22/2018 1510   BUN 9 03/22/2018 1510   CREATININE 1.21 03/22/2018 1510   CREATININE 1.08 08/09/2016 1506   CALCIUM 9.5 03/22/2018 1510   PROT 7.5 03/22/2018 1510   ALBUMIN 3.7 03/22/2018 1510   AST 18 03/22/2018 1510   ALT 18 03/22/2018 1510   ALKPHOS 84 03/22/2018 1510   BILITOT 0.7 03/22/2018 1510   GFRNONAA >60 03/22/2018 1510   GFRNONAA 85 08/09/2016 1506   GFRAA >60 03/22/2018 1510   GFRAA >89 08/09/2016 1506    Lipase  No results found for: LIPASE     Studies/Results: Ct Pelvis W Contrast  Result Date: 03/22/2018 CLINICAL DATA:  Rectal abscess for 3 days. EXAM: CT PELVIS WITH CONTRAST TECHNIQUE: Multidetector CT imaging of the pelvis was performed using the standard protocol following the bolus administration of intravenous contrast. CONTRAST:  100mL ISOVUE-300 IOPAMIDOL (ISOVUE-300) INJECTION 61% COMPARISON:  None. FINDINGS: Urinary Tract:  No abnormality visualized. Bowel: Intrapelvic portion of the bowel appears normal. No perirectal abscess collection. No bowel wall thickening or evidence of bowel wall inflammation. There is perianal soft tissue thickening, to the LEFT of midline, with central fluid component, compatible with perianal abscess. The overall thickening measures approximately 4.5 by 1.5 cm. The small central fluid component measures approximately 8 mm greatest dimension. Again, no superior extension to the perirectal space. Vascular/Lymphatic: Vascular structures are unremarkable. No enlarged or morphologically abnormal lymph nodes identified. Reproductive:  No mass or other significant abnormality Other:  Superficial soft tissues otherwise unremarkable. Musculoskeletal: No acute or suspicious osseous finding. IMPRESSION: 1. Small superficial perianal abscess collection. Most prominent component is soft tissue thickening/edema which measures approximately 4.5 cm greatest AP extent and approximately 1.5 cm greatest thickness. The small internal fluid component measures only 8 mm greatest dimension. 2. No perirectal abscess or inflammatory change. Intrapelvic portion of the bowel appears normal. Electronically Signed  By: Bary Richard M.D.   On: 03/22/2018 16:09    Anti-infectives: Anti-infectives (From admission, onward)   Start     Dose/Rate Route Frequency Ordered Stop   03/23/18 1000  ciprofloxacin (CIPRO) IVPB 400 mg     400 mg 200 mL/hr over 60 Minutes Intravenous Every 12 hours  03/22/18 2226     03/23/18 0600  metroNIDAZOLE (FLAGYL) IVPB 500 mg     500 mg 100 mL/hr over 60 Minutes Intravenous Every 8 hours 03/22/18 2226     03/22/18 1929  metroNIDAZOLE (FLAGYL) IVPB 500 mg     500 mg 100 mL/hr over 60 Minutes Intravenous 60 min pre-op 03/22/18 1930 03/23/18 0646   03/22/18 1830  ciprofloxacin (CIPRO) IVPB 400 mg     400 mg 200 mL/hr over 60 Minutes Intravenous  Once 03/22/18 1828 03/22/18 2115   03/22/18 1830  metroNIDAZOLE (FLAGYL) IVPB 500 mg     500 mg 100 mL/hr over 60 Minutes Intravenous  Once 03/22/18 1828 03/22/18 2130     Assessment/Plan Perirectal abscess POD#1 S/p I&D 03/22/18 Dr. Jimmye Norman  - packing removed, start sitz baths 4x daily and PRN for wound contamination - continue IV abx today - PO pain control - mobilize!   FEN: Reg ID: cipro, flagyl VTE: SCD's, lovenox Foley: none   Dispo: sitz baths, pain control, abx, probably discharge home tomorrow   LOS: 0 days    Adam Phenix , [facility] 03/23/2018, 10:02 AM Pager: [phone] Consults: [phone] Mon-Fri 7:00 am-4:30 pm Sat-Sun 7:00 am-11:30 am

## 2018-03-23 NOTE — Anesthesia Postprocedure Evaluation (Signed)
Anesthesia Post Note  Patient: Martin Rose  Procedure(s) Performed: IRRIGATION AND DEBRIDEMENT PERIRECTAL ABSCESS (Left )     Patient location during evaluation: PACU Anesthesia Type: General Level of consciousness: awake and alert Pain management: pain level controlled Vital Signs Assessment: post-procedure vital signs reviewed and stable Respiratory status: spontaneous breathing, nonlabored ventilation, respiratory function stable and patient connected to nasal cannula oxygen Cardiovascular status: blood pressure returned to baseline and stable Postop Assessment: no apparent nausea or vomiting Anesthetic complications: no    Last Vitals:  Vitals:   03/23/18 0119 03/23/18 0537  BP: (!) 135/92 122/79  Pulse: 61 70  Resp: 18 19  Temp: 36.6 C 36.5 C  SpO2: 98% 100%    Last Pain:  Vitals:   03/23/18 0537  TempSrc: Oral  PainSc:                  [name]

## 2018-03-24 MED ORDER — OXYCODONE HCL 5 MG PO TABS: 5.0000 mg | ORAL_TABLET | Freq: Four times a day (QID) | ORAL | 0 refills | Status: DC | PRN

## 2018-03-24 MED ORDER — ACETAMINOPHEN 500 MG PO TABS: 1000.0000 mg | ORAL_TABLET | Freq: Four times a day (QID) | ORAL | 0 refills | Status: DC | PRN

## 2018-03-24 NOTE — Discharge Summary (Signed)
[facility] Discharge Summary   Patient ID: Martin MealingJovon Ackley MRN: [id number] DOB/AGE: [birth year] 42 y.o.  Admit date: 03/22/2018 Discharge date: 03/24/2018 Discharge Diagnosis Patient Active Problem List   Diagnosis Date Noted  . Peri-rectal abscess 03/23/2018  . Rectal abscess 03/22/2018  . Perirectal abscess 03/22/2018  . Palpitations 07/11/2017  . Alcohol use 07/11/2017  . Allergic rhinitis 06/24/2015  . Morbid obesity (HCC) 04/30/2013  . Snoring 04/30/2013  . Hidradenitis 09/04/2012  . Essential hypertension, benign 03/10/2009    Consultants N/A   Imaging: Ct Pelvis W Contrast  Result Date: 03/22/2018 CLINICAL DATA:  Rectal abscess for 3 days. EXAM: CT PELVIS WITH CONTRAST TECHNIQUE: Multidetector CT imaging of the pelvis was performed using the standard protocol following the bolus administration of intravenous contrast. CONTRAST:  100mL ISOVUE-300 IOPAMIDOL (ISOVUE-300) INJECTION 61% COMPARISON:  None. FINDINGS: Urinary Tract:  No abnormality visualized. Bowel: Intrapelvic portion of the bowel appears normal. No perirectal abscess collection. No bowel wall thickening or evidence of bowel wall inflammation. There is perianal soft tissue thickening, to the LEFT of midline, with central fluid component, compatible with perianal abscess. The overall thickening measures approximately 4.5 by 1.5 cm. The small central fluid component measures approximately 8 mm greatest dimension. Again, no superior extension to the perirectal space. Vascular/Lymphatic: Vascular structures are unremarkable. No enlarged or morphologically abnormal lymph nodes identified. Reproductive:  No mass or other significant abnormality Other:  Superficial soft tissues otherwise unremarkable. Musculoskeletal: No acute or suspicious osseous finding. IMPRESSION: 1. Small superficial perianal abscess collection. Most prominent component is soft tissue thickening/edema which measures approximately 4.5 cm  greatest AP extent and approximately 1.5 cm greatest thickness. The small internal fluid component measures only 8 mm greatest dimension. 2. No perirectal abscess or inflammatory change. Intrapelvic portion of the bowel appears normal. Electronically Signed   By: Bary RichardStan  Maynard M.D.   On: 03/22/2018 16:09    Procedures Dr. Jimmye NormanJames Wyatt 03/22/18 - irrigation and debridement perirectal abscess   Hospital Course:  42 y/o male [facility] with a painful, left-sided perirectal abscess. Imagine above. He was admitted, started on antibiotics, and underwent incision and drainage as above by Dr. Lindie SpruceWyatt. On POD#1 his surgical packing was removed and he was started on sitz baths. On POD#1 the patients pain was controlled, vitals stable, having bowel movements, mobilizing, and stable for discharge home. He will follow up in our surgical office in 7-10 days and knows to call with questions/concerns.  Physical Exam: General:  Alert, NAD, pleasant, comfortable Skin: left peri-rectal incision, ~3 cm long, with some serosanguinous drainage on dressing. Surrounding induration improved. No cellulitis.  Allergies as of 03/24/2018   No Known Allergies     Medication List    STOP taking these medications   hydrocortisone 2.5 % rectal cream Commonly known as:  ANUSOL-HC     TAKE these medications   acetaminophen 500 MG tablet Commonly known as:  TYLENOL Take 2 tablets (1,000 mg total) by mouth every 6 (six) hours as needed.   amLODipine 10 MG tablet Commonly known as:  NORVASC Take 1 tablet (10 mg total) by mouth daily.   atenolol 50 MG tablet Commonly known as:  TENORMIN Take 1 tablet (50 mg total) by mouth daily.   ibuprofen 200 MG tablet Commonly known as:  ADVIL,MOTRIN Take 800 mg by mouth every 6 (six) hours as needed (for pain). What changed:  Another medication with the same name was removed. Continue taking this medication, and follow  the directions you see here.    oxyCODONE 5 MG immediate release tablet Commonly known as:  Oxy IR/ROXICODONE Take 1 tablet (5 mg total) by mouth every 6 (six) hours as needed for moderate pain.         Signed: Hosie Spangle, [facility] 03/24/2018, 10:22 AM Pager: [phone] Consults: [phone] Mon-Fri 7:00 am-4:30 pm Sat-Sun 7:00 am-11:30 am

## 2018-03-24 NOTE — Progress Notes (Addendum)
Discharge instructions reviewed with pt, along with instructions on care to incision area.  Pt did not have f/u appointment, he was instructed to f/u with [facility] for appointment.  Pt was given his prescription and work Physicist, medicalletter.  Pt will follow up with [facility].  Pt left ambulatory with his wife. I did paged Marisue IvanLiz, [place] with [facility] for instruction to give the Pt, and had not heard back from the her prior to the Pt leaving. Again, Pt will f/u with [facility] directly.

## 2018-03-24 NOTE — Discharge Instructions (Signed)
How to Take a Sitz Bath °A sitz bath is a warm water bath that is taken while you are sitting down. The water should only come up to your hips and should cover your buttocks. Your health care provider may recommend a sitz bath to help you: °· Clean the lower part of your body, including your genital area. °· With itching. °· With pain. °· With sore muscles or muscles that tighten or spasm. ° °How to take a sitz bath °Take 3-4 sitz baths per day or as told by your health care provider. °1. Partially fill a bathtub with warm water. You will only need the water to be deep enough to cover your hips and buttocks when you are sitting in it. °2. If your health care provider told you to put medicine in the water, follow the directions exactly. °3. Sit in the water and open the tub drain a little. °4. Turn on the warm water again to keep the tub at the correct level. Keep the water running constantly. °5. Soak in the water for 15-20 minutes or as told by your health care provider. °6. After the sitz bath, pat the affected area dry first. Do not rub it. °7. Be careful when you stand up after the sitz bath because you may feel dizzy. ° °Contact a health care provider if: °· Your symptoms get worse. Do not continue with sitz baths if your symptoms get worse. °· You have new symptoms. Do not continue with sitz baths until you talk with your health care provider. °This information is not intended to replace advice given to you by your health care provider. Make sure you discuss any questions you have with your health care provider. °Document Released: 08/21/2004 Document Revised: 04/28/2016 Document Reviewed: 11/27/2014 °Elsevier Interactive Patient Education © 2018 [employer] ° °

## 2018-05-22 ENCOUNTER — Encounter: Payer: Self-pay | Admitting: Internal Medicine

## 2018-05-22 ENCOUNTER — Ambulatory Visit (INDEPENDENT_AMBULATORY_CARE_PROVIDER_SITE_OTHER): Payer: BLUE CROSS/BLUE SHIELD | Admitting: Internal Medicine

## 2018-05-22 DIAGNOSIS — K611 Rectal abscess: Secondary | ICD-10-CM

## 2018-05-22 DIAGNOSIS — R21 Rash and other nonspecific skin eruption: Secondary | ICD-10-CM | POA: Diagnosis not present

## 2018-05-22 MED ORDER — MELOXICAM 15 MG PO TABS: 15.0000 mg | ORAL_TABLET | Freq: Every day | ORAL | 1 refills | Status: DC

## 2018-05-22 NOTE — Progress Notes (Signed)
   Subjective:   Patient: Martin Rose       Birthdate: December 10, 1976       MRN: [id number]      HPI  Martin Rose is a 42 y.o. male presenting for f/u of perirectal abscess as well as new L arm rash.   Perirectal abscess I&D of perirectal abscess performed on 03/22/18 by Dr. Lind[facility]att [facility]. Patient says that no post-op follow up was scheduled, so he has not been seen by surgery since. Says that incision has mostly healed, however the edge closest to his rectum is still open and is painful. Most painful during the day when he is driving in his truck. Has been taking 5 ibuprofen daily to help with pain. Still with some draining, though says really only drains significantly about once per week. Denies fever, chills, nausea, vomiting. Has not put anything on the area.   L arm rash First noticed 3w ago. 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 Initially noticed skin was very dry, but denies peeling. Since then has had some hypopigmentation of the skin in that area, as well as burning and itching sensation when sun is shining on it. He has been putting a towel over his arm when he is driving to prevent sun exposure. Sprayed the area with rubbing alcohol once but otherwise has not put any lotions or creams on the area.   Smoking status reviewed. Patient is former smoker.   Review of Systems See HPI.     Objective:  Physical Exam  Constitutional: He is oriented to person, place, and time. He appears well-developed and well-nourished. No distress.  HENT:  Head: Normocephalic and atraumatic.  Pulmonary/Chest: Effort normal. No respiratory distress.  Genitourinary:  Genitourinary Comments: Well-healing incision near rectum, however ~one-third of incision closest to rectum is still open with minimal drainage present. No surrounding erythema.   Neurological: He is alert and oriented to person, place, and time.  Skin:  Hypopigmented macules present on L  forearm near elbow with surrounding very dry skin. No peeling or flaking however.   Psychiatric: He has a normal mood and affect. His behavior is normal.      Assessment & Plan:  Skin rash L arm. Does not recall skin peeling, but exposure to sun and areas of hypopigmentation would be consistent with sunburn with subsequent peeling of skin. Surrounding dry skin could be consistent with eczema, however would not expect to present at this age and in an unusual location. Do not feel that topical steroids are indicated. Instead will begin with moisturizing with a topical emollient. Vaseline at least BID. Continue to cover arm from sun in truck. F/u PRN.   Rectal abscess Has mostly healed, but still one area nearest rectum that has not and continues to drain and cause discomfort. No obvious signs of infection on exam today, and denies systemic signs of infection. Feel that f/u with surgery is most appropriate. Provided patient with number for surgery office, and encouraged to call to set up appt. Discussed return precautions. Will also discontinue ibuprofen and begin Mobic, as patient has been taking for a while now and unsure when he will be able to see surgery.    Tarri AbernethyAbigail J Lanasia Porras, MD, MPH PGY-3 [facility] Pager [phone]

## 2018-05-22 NOTE — Patient Instructions (Signed)
It was nice meeting you today Mr. Sandi MariscalMcKinnon!  Your surgeon was [facility]. Their phone number is [phone].   For pain, you can take one Mobic tablet per day. Do not take ibuprofen, naproxen, or any products containing these medications while you are taking Mobic. It is safe to take Tylenol and Mobic together.   For your arm, begin putting a thick greasy moisturizer, such as Vaseline, on the area at least twice per day, but as many times per day as you would like.   If you have any questions or concerns, please feel free to call the clinic.   Be well,  Dr. Natale MilchLancaster

## 2018-05-22 NOTE — Assessment & Plan Note (Signed)
L arm. Does not recall skin peeling, but exposure to sun and areas of hypopigmentation would be consistent with sunburn with subsequent peeling of skin. Surrounding dry skin could be consistent with eczema, however would not expect to present at this age and in an unusual location. Do not feel that topical steroids are indicated. Instead will begin with moisturizing with a topical emollient. Vaseline at least BID. Continue to cover arm from sun in truck. F/u PRN.

## 2018-05-22 NOTE — Assessment & Plan Note (Addendum)
Has mostly healed, but still one area nearest rectum that has not and continues to drain and cause discomfort. No obvious signs of infection on exam today, and denies systemic signs of infection. Feel that f/u with surgery is most appropriate. Provided patient with number for surgery office, and encouraged to call to set up appt. Discussed return precautions. Will also discontinue ibuprofen and begin Mobic, as patient has been taking for a while now and unsure when he will be able to see surgery.

## 2018-07-18 ENCOUNTER — Ambulatory Visit: Payer: Self-pay | Admitting: Surgery

## 2018-07-18 NOTE — H&P (Signed)
CC: Referred by Dr. Lindie SpruceWyatt for possible perianal fistula  HPI: [name] is a very pleasant 42yoM with hx of HTN who underwent incision and drainage of a perianal abscess on 03/22/18 by Dr. Lindie SpruceWyatt. He was lost to follow-up after this but return to the office in early July for evaluation of a draining wound of the perianal area. Since that time, he reports intermittent perianal drainage that is milky in color. He denies any recent issues with fevers and chills. His perianal discomfort has been stable over the last 2 months.  He denies any history of incontinence to solid stool, liquid stool, nor gas.  He has never had a colonoscopy  He denies any personal or family history of colorectal cancer, IBD/Crohn's/ulcerative colitis  PMH: Hypertension (well controlled on oral antihypertensive)  PSH: As stated above  FHx: Denies FHx of malignancy  Social: Denies use of tobacco/EtOH/drugs  ROS: A comprehensive 10 system review of systems was completed with the patient and pertinent findings as noted above.  The patient is a 42 year old male.   Allergies Judithann Sauger(Patricia King, [place]; 07/18/2018 9:41 AM) No Known Drug Allergies [03/22/2018]: Allergies Reconciled   Medication History Judithann Sauger(Patricia King, [place]; 07/18/2018 9:41 AM) TraMADol HCl ER (100MG  Capsule ER 24HR, Oral, Taken starting 06/05/2018) Active. TraMADol HCl (50MG  Tablet, 1 Oral every 4 to 6 hours as needed for pain, Taken starting 06/05/2018) Active. Augmentin (875-125MG  Tablet, 1 Oral twice a day, Taken starting 06/05/2018) Active. AmLODIPine Besylate (10MG  Tablet, Oral) Active. Atenolol (50MG  Tablet, Oral) Active. Medications Reconciled    Review of Systems Cristal Deer(Raeonna Milo M. Rori Goar MD; 07/18/2018 9:48 AM) General Not Present- Chills and Fever. HEENT Not Present- Blurred Vision and Headache. Neck Not Present- Neck Mass and Neck Pain. Respiratory Not Present- Cough and Difficulty Breathing on Exertion. Cardiovascular Not Present- Chest  Pain, Difficulty Breathing Lying Down, Leg Cramps, Palpitations, Rapid Heart Rate, Shortness of Breath and Swelling of Extremities. Gastrointestinal Not Present- Abdominal Pain, Bloating, Bloody Stool, Change in Bowel Habits, Chronic diarrhea, Constipation, Difficulty Swallowing, Excessive gas, Gets full quickly at meals, Hemorrhoids, Indigestion, Nausea, Rectal Pain and Vomiting. Musculoskeletal Not Present- Back Pain, Joint Pain, Joint Stiffness, Muscle Pain, Muscle Weakness and Swelling of Extremities. Psychiatric Not Present- Anxiety and Depression. Endocrine Not Present- Appetite Changes and Cold Intolerance. Hematology Not Present- Blood Thinners, Easy Bruising, Excessive bleeding, Gland problems, HIV and Persistent Infections.  Vitals Judithann Sauger(Patricia King RMA; 07/18/2018 9:40 AM) 07/18/2018 9:40 AM Weight: 283 lb Height: 71in Body Surface Area: 2.44 m Body Mass Index: 39.47 kg/m  Temp.: 98.51F  Pulse: 98 (Regular)  BP: 140/82 (Sitting, Left Arm, Standard)       Physical Exam Cristal Deer(Lenice Koper M. Kyden Potash MD; 07/18/2018 9:48 AM) The physical exam findings are as follows: Note:Constitutional: No acute distress; conversant; no deformities Eyes: Moist conjunctiva; no lid lag; anicteric sclerae; pupils equal round and reactive to light Neck: Trachea midline; no palpable thyromegaly Lungs: Normal respiratory effort; no tactile fremitus CV: Regular rate and rhythm; no palpable thrill; no pitting edema GI: Abdomen soft, nontender, nondistended; no palpable hepatosplenomegaly Anorectal: Left anterolateral area consistent with external opening. DRE/anoscopy declined by patient until EUA MSK: Normal gait; no clubbing/cyanosis Psychiatric: Appropriate affect; alert and oriented 3 Lymphatic: No palpable cervical or axillary lymphadenopathy    Assessment & Plan Cristal Deer(Satine Hausner M. 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 The pathophysiology of anal abscess and fistula was discussed at length with associated pictures and illustrations. -I recommend he begin taking a daily fiber supplement, drinks 64 ounces of water per day, and minimized, commode to 2-3 minutes. -I discussed options moving forward including observation versus surgery. The risks of observation include persistent fistula and recurrent abscess. We discussed the surgical options including anorectal exam under anesthesia with possible seton versus fistulotomy based on intraoperative findings. -The planned procedure, material risks (including, but not limited to, pain, bleeding, infection, scarring, need for blood transfusion, damage to anal sphincter, incontinence of gas and/or stool, need for additional procedures, recurrence, pneumonia, heart attack, stroke, death) benefits and alternatives to surgery were discussed at length. I noted a good probability that the procedure would help improve their symptoms. The patient's questions were answered to their satisfaction, they voiced understanding and elected to proceed with surgery. Additionally, we discussed typical postoperative expectations and the recovery process.  Signed electronically by Andria Meuse, MD (07/18/2018 10:02 AM)

## 2018-07-30 ENCOUNTER — Emergency Department (HOSPITAL_COMMUNITY): Payer: No Typology Code available for payment source

## 2018-07-30 ENCOUNTER — Encounter (HOSPITAL_COMMUNITY): Payer: Self-pay | Admitting: Radiology

## 2018-07-30 ENCOUNTER — Inpatient Hospital Stay (HOSPITAL_COMMUNITY)
Admission: EM | Admit: 2018-07-30 | Discharge: 2018-08-02 | DRG: 346 | Disposition: A | Discharge status: Home / Self Care | Payer: No Typology Code available for payment source | Attending: General Surgery | Admitting: General Surgery

## 2018-07-30 DIAGNOSIS — I10 Essential (primary) hypertension: Secondary | ICD-10-CM | POA: Diagnosis present

## 2018-07-30 DIAGNOSIS — Z8249 Family history of ischemic heart disease and other diseases of the circulatory system: Secondary | ICD-10-CM | POA: Diagnosis not present

## 2018-07-30 DIAGNOSIS — K610 Anal abscess: Secondary | ICD-10-CM | POA: Diagnosis present

## 2018-07-30 DIAGNOSIS — Z823 Family history of stroke: Secondary | ICD-10-CM | POA: Diagnosis not present

## 2018-07-30 DIAGNOSIS — Z87891 Personal history of nicotine dependence: Secondary | ICD-10-CM

## 2018-07-30 DIAGNOSIS — Z808 Family history of malignant neoplasm of other organs or systems: Secondary | ICD-10-CM | POA: Diagnosis not present

## 2018-07-30 DIAGNOSIS — K611 Rectal abscess: Principal | ICD-10-CM | POA: Diagnosis present

## 2018-07-30 LAB — CBC WITH DIFFERENTIAL/PLATELET
Abs Immature Granulocytes: 0.1 10*3/uL (ref 0.0–0.1)
Basophils Absolute: 0.1 10*3/uL (ref 0.0–0.1)
Basophils Relative: 0 %
Eosinophils Absolute: 0.3 10*3/uL (ref 0.0–0.7)
Eosinophils Relative: 2 %
HCT: 40.6 % (ref 39.0–52.0)
Hemoglobin: 12.8 g/dL — ABNORMAL LOW (ref 13.0–17.0)
Immature Granulocytes: 0 %
Lymphocytes Relative: 19 %
Lymphs Abs: 2.6 10*3/uL (ref 0.7–4.0)
MCH: 27.1 pg (ref 26.0–34.0)
MCHC: 31.5 g/dL (ref 30.0–36.0)
MCV: 85.8 fL (ref 78.0–100.0)
Monocytes Absolute: 1.4 10*3/uL — ABNORMAL HIGH (ref 0.1–1.0)
Monocytes Relative: 10 %
Neutro Abs: 9.5 10*3/uL — ABNORMAL HIGH (ref 1.7–7.7)
Neutrophils Relative %: 69 %
Platelets: 316 10*3/uL (ref 150–400)
RBC: 4.73 MIL/uL (ref 4.22–5.81)
RDW: 13.7 % (ref 11.5–15.5)
WBC: 13.8 10*3/uL — ABNORMAL HIGH (ref 4.0–10.5)

## 2018-07-30 LAB — BASIC METABOLIC PANEL
Anion gap: 9 (ref 5–15)
BUN: <5 mg/dL — ABNORMAL LOW (ref 6–20)
CO2: 26 mmol/L (ref 22–32)
Calcium: 9.2 mg/dL (ref 8.9–10.3)
Chloride: 107 mmol/L (ref 98–111)
Creatinine, Ser: 1.07 mg/dL (ref 0.61–1.24)
GFR calc Af Amer: >60 mL/min (ref >60)
GFR calc non Af Amer: >60 mL/min (ref >60)
Glucose, Bld: 119 mg/dL — ABNORMAL HIGH (ref 70–99)
Potassium: 4.7 mmol/L (ref 3.5–5.1)
Sodium: 142 mmol/L (ref 135–145)

## 2018-07-30 MED ORDER — ACETAMINOPHEN 325 MG PO TABS
650.0000 mg | ORAL_TABLET | Freq: Four times a day (QID) | ORAL | Status: DC | PRN
  Administered 2018-07-31 – 2018-08-01 (×2): 650 mg via ORAL
  Filled 2018-07-30: qty 2

## 2018-07-30 MED ORDER — ONDANSETRON 4 MG PO TBDP: 4.0000 mg | ORAL_TABLET | Freq: Four times a day (QID) | ORAL | Status: DC | PRN

## 2018-07-30 MED ORDER — ENOXAPARIN SODIUM 40 MG/0.4ML ~~LOC~~ SOLN
40.0000 mg | SUBCUTANEOUS | Status: DC
Start: 2018-07-30 — End: 2018-08-02
  Administered 2018-07-30 – 2018-08-01 (×3): 40 mg via SUBCUTANEOUS
  Filled 2018-07-30 (×3): qty 0.4

## 2018-07-30 MED ORDER — KETOROLAC TROMETHAMINE 30 MG/ML IJ SOLN
30.0000 mg | Freq: Once | INTRAMUSCULAR | Status: AC
  Administered 2018-07-30: 30 mg via INTRAVENOUS
  Filled 2018-07-30: qty 1

## 2018-07-30 MED ORDER — ONDANSETRON HCL 4 MG/2ML IJ SOLN: 4.0000 mg | Freq: Four times a day (QID) | INTRAMUSCULAR | Status: DC | PRN

## 2018-07-30 MED ORDER — DIPHENHYDRAMINE HCL 25 MG PO CAPS
25.0000 mg | ORAL_CAPSULE | Freq: Four times a day (QID) | ORAL | Status: DC | PRN
  Administered 2018-07-30 – 2018-08-02 (×3): 25 mg via ORAL
  Filled 2018-07-30 (×3): qty 1

## 2018-07-30 MED ORDER — MORPHINE SULFATE (PF) 4 MG/ML IV SOLN
4.0000 mg | Freq: Once | INTRAVENOUS | Status: AC
  Administered 2018-07-30: 4 mg via INTRAVENOUS
  Filled 2018-07-30: qty 1

## 2018-07-30 MED ORDER — ACETAMINOPHEN 650 MG RE SUPP: 650.0000 mg | Freq: Four times a day (QID) | RECTAL | Status: DC | PRN

## 2018-07-30 MED ORDER — SODIUM CHLORIDE 0.9 % IV BOLUS
1000.0000 mL | Freq: Once | INTRAVENOUS | Status: AC
  Administered 2018-07-30: 1000 mL via INTRAVENOUS

## 2018-07-30 MED ORDER — OXYCODONE-ACETAMINOPHEN 5-325 MG PO TABS
1.0000 | ORAL_TABLET | Freq: Once | ORAL | Status: AC
Start: 2018-07-30 — End: 2018-07-30
  Administered 2018-07-30: 1 via ORAL
  Filled 2018-07-30: qty 1

## 2018-07-30 MED ORDER — IOPAMIDOL (ISOVUE-300) INJECTION 61%
INTRAVENOUS | Status: AC
  Administered 2018-07-30: 100 mL
  Filled 2018-07-30: qty 100

## 2018-07-30 MED ORDER — MORPHINE SULFATE (PF) 2 MG/ML IV SOLN
1.0000 mg | INTRAVENOUS | Status: DC | PRN
  Administered 2018-07-30 – 2018-08-01 (×8): 4 mg via INTRAVENOUS
  Filled 2018-07-30 (×8): qty 2

## 2018-07-30 MED ORDER — PIPERACILLIN-TAZOBACTAM 3.375 G IVPB
3.3750 g | Freq: Three times a day (TID) | INTRAVENOUS | Status: DC
  Administered 2018-07-30 – 2018-08-02 (×7): 3.375 g via INTRAVENOUS
  Filled 2018-07-30 (×6): qty 50

## 2018-07-30 MED ORDER — OXYCODONE HCL 5 MG PO TABS
5.0000 mg | ORAL_TABLET | ORAL | Status: DC | PRN
  Administered 2018-07-30 – 2018-08-02 (×9): 10 mg via ORAL
  Administered 2018-08-02: 5 mg via ORAL
  Administered 2018-08-02: 10 mg via ORAL
  Filled 2018-07-30 (×10): qty 2

## 2018-07-30 MED ORDER — DIPHENHYDRAMINE HCL 50 MG/ML IJ SOLN
25.0000 mg | Freq: Four times a day (QID) | INTRAMUSCULAR | Status: DC | PRN
Start: 2018-07-30 — End: 2018-08-02

## 2018-07-30 MED ORDER — POTASSIUM CHLORIDE IN NACL 20-0.9 MEQ/L-% IV SOLN
INTRAVENOUS | Status: DC
Start: 2018-07-30 — End: 2018-08-02
  Administered 2018-07-30 – 2018-08-01 (×4): via INTRAVENOUS
  Filled 2018-07-30 (×5): qty 1000

## 2018-07-30 NOTE — ED Notes (Signed)
Surgery at bedside for evaluation

## 2018-07-30 NOTE — ED Provider Notes (Signed)
[facility] EMERGENCY DEPARTMENT Provider Note   CSN: [id number] Arrival date & time: 07/30/18  [id number]     History   Chief Complaint Chief Complaint  Patient presents with  . Abscess    HPI Martin Rose is a 42 y.o. male presenting for 3 days of left medial buttock pain and swelling.  Patient describes the pain as severe 10/10 and throbbing in nature.  Patient states that he has not taken anything for his pain.  Patient states that palpation and sitting make the pain worse.  Patient with history of perianal/perirectal abscesses.  Patient underwent procedure on 03/22/2018 for irrigation and debridement of perirectal abscess.  HPI  Past Medical History:  Diagnosis Date  . Hypertension     Patient Active Problem List   Diagnosis Date Noted  . Skin rash 05/22/2018  . Peri-rectal abscess 03/23/2018  . Rectal abscess 03/22/2018  . Perirectal abscess 03/22/2018  . Palpitations 07/11/2017  . Alcohol use 07/11/2017  . Allergic rhinitis 06/24/2015  . Morbid obesity (HCC) 04/30/2013  . Snoring 04/30/2013  . Hidradenitis 09/04/2012  . Essential hypertension, benign 03/10/2009    Past Surgical History:  Procedure Laterality Date  . INCISION AND DRAINAGE PERIRECTAL ABSCESS  03/22/2018  . INCISION AND DRAINAGE PERIRECTAL ABSCESS Left 03/22/2018   Procedure: IRRIGATION AND DEBRIDEMENT PERIRECTAL ABSCESS;  Surgeon: Jimmye NormanWyatt, James, MD;  [facility];  Service: General;  Laterality: Left;        Home Medications    Prior to Admission medications   Medication Sig Start Date End Date Taking? Authorizing Provider  amLODipine (NORVASC) 10 MG tablet Take 1 tablet (10 mg total) by mouth daily. 09/06/17  Yes Bland, Scott, DO  atenolol (TENORMIN) 50 MG tablet Take 1 tablet (50 mg total) by mouth daily. 09/06/17  Yes Bland, Scott, DO  ibuprofen (ADVIL,MOTRIN) 200 MG tablet Take 1,000 mg by mouth every 6 (six) hours as needed (for pain).    Yes [provider]    acetaminophen (TYLENOL) 500 MG tablet Take 2 tablets (1,000 mg total) by mouth every 6 (six) hours as needed. Patient not taking: Reported on 07/30/2018 03/24/18   Adam PhenixSimaan, Elizabeth S, PA-C  meloxicam (MOBIC) 15 MG tablet Take 1 tablet (15 mg total) by mouth daily. Patient not taking: Reported on 07/30/2018 05/22/18   Marquette SaaLancaster, Abigail Joseph, MD  oxyCODONE (OXY IR/ROXICODONE) 5 MG immediate release tablet Take 1 tablet (5 mg total) by mouth every 6 (six) hours as needed for moderate pain. Patient not taking: Reported on 07/30/2018 03/24/18   Adam PhenixSimaan, Elizabeth S, PA-C    Family History Family History  Problem Relation Age of Onset  . Hypertension Mother   . Hypertension Father   . Cancer Sister 30       brain tumor  . Stroke Sister     Social History Social History   Tobacco Use  . Smoking status: Former Smoker    Packs/day: 0.50    Years: 15.00    Pack years: 7.50    Types: Cigarettes    Last attempt to quit: 2010    Years since quitting: 9.6  . Smokeless tobacco: Never Used  Substance Use Topics  . Alcohol use: Yes    Alcohol/week: 24.0 standard drinks    Types: 24 Cans of beer per week  . Drug use: Yes    Types: Marijuana    Comment: 03/22/2018 "2-3 times/wk"     Allergies   Patient has no known allergies.   Review of  Systems Review of Systems  Constitutional: Negative.  Negative for chills and fever.  HENT: Negative.  Negative for rhinorrhea and sore throat.   Eyes: Negative.  Negative for visual disturbance.  Respiratory: Negative.  Negative for cough and shortness of breath.   Cardiovascular: Negative.  Negative for chest pain.  Gastrointestinal: Negative.  Negative for abdominal pain, anal bleeding, blood in stool, diarrhea, nausea and vomiting.  Genitourinary: Negative.  Negative for dysuria, hematuria, scrotal swelling and testicular pain.  Musculoskeletal: Negative.  Negative for arthralgias and myalgias.  Skin:       abscess  Neurological: Negative.   Negative for dizziness, weakness and headaches.     Physical Exam Updated Vital Signs BP 140/90   Pulse 82   Temp 98.3 F (36.8 C) (Oral)   Resp 18   Ht 6' (1.829 m)   Wt 127 kg   SpO2 99%   BMI 37.97 kg/m   Physical Exam  Constitutional: He is oriented to person, place, and time. He appears well-developed and well-nourished. No distress.  HENT:  Head: Normocephalic and atraumatic.  Right Ear: External ear normal.  Left Ear: External ear normal.  Nose: Nose normal.  Eyes: Pupils are equal, round, and reactive to light. EOM are normal.  Neck: Trachea normal and normal range of motion. No tracheal deviation present.  Pulmonary/Chest: Effort normal. No respiratory distress.  Abdominal: Soft. There is no tenderness. There is no rebound and no guarding.  Musculoskeletal: Normal range of motion.  Neurological: He is alert and oriented to person, place, and time.  Skin: Skin is warm and dry.     Rectal examination chaperoned by Ricard Dillon. No palpable masses or induration with in the rectum.  Psychiatric: He has a normal mood and affect. His behavior is normal.     ED Treatments / Results  Labs (all labs ordered are listed, but only abnormal results are displayed) Labs Reviewed  CBC WITH DIFFERENTIAL/PLATELET - Abnormal; Notable for the following components:      Result Value   WBC 13.8 (*)    Hemoglobin 12.8 (*)    Neutro Abs 9.5 (*)    Monocytes Absolute 1.4 (*)    All other components within normal limits  BASIC METABOLIC PANEL - Abnormal; Notable for the following components:   Glucose, Bld 119 (*)    BUN <5 (*)    All other components within normal limits    EKG None  Radiology Ct Pelvis W Contrast  Result Date: 07/30/2018 CLINICAL DATA:  Left medial buttock abscess. EXAM: CT PELVIS WITH CONTRAST TECHNIQUE: Multidetector CT imaging of the pelvis was performed using the standard protocol following the bolus administration of intravenous contrast. CONTRAST:   ISOVUE-300 IOPAMIDOL (ISOVUE-300) INJECTION 61% COMPARISON:  03/22/2018 FINDINGS: Urinary Tract:  No abnormality visualized. Bowel:  Unremarkable visualized pelvic bowel loops. Vascular/Lymphatic: No pathologically enlarged lymph nodes. No significant vascular abnormality seen. Reproductive:  No mass or other significant abnormality Other: There is a left medial buttock perianal abscess which measures 8.0 x 4.0 x 6.5 cm. No definite simple fluid component is seen. There is surrounding skin thickening and subcutaneous fat stranding. The process is extraperitoneal, and in a similar location to the abscess demonstrated by CT in April 2019. Musculoskeletal: No suspicious bone lesions identified. IMPRESSION: Left medial buttock perianal abscess which measures up to 8 cm. No clear fluid component is seen. Electronically Signed   By: Ted Mcalpine M.D.   On: 07/30/2018 16:39    Procedures Procedures (including  critical care time)  Medications Ordered in ED Medications  oxyCODONE-acetaminophen (PERCOCET/ROXICET) 5-325 MG per tablet 1 tablet (1 tablet Oral Given 07/30/18 1315)  sodium chloride 0.9 % bolus 1,000 mL (1,000 mLs Intravenous New Bag/Given 07/30/18 1530)  morphine 4 MG/ML injection 4 mg (4 mg Intravenous Given 07/30/18 1531)  iopamidol (ISOVUE-300) 61 % injection (100 mLs  Contrast Given 07/30/18 1545)     Initial Impression / Assessment and Plan / ED Course  I have reviewed the triage vital signs and the nursing notes.  Pertinent labs & imaging results that were available during my care of the patient were reviewed by me and considered in my medical decision making (see chart for details).    CT scan shows left medial buttock perianal abscess which measures up to 8 cm. CBC shows mildly elevated white count.  BMP nonacute.  Vital signs stable. Fluids and pain medication given in department. Consult called to general surgery who have agreed to see the patient here in department.   Patient and family state understanding of care plan are agreeable.  Patient has been admitted by Dr. Magnus IvanBlackman for further evaluation and treatment.  Final Clinical Impressions(s) / ED Diagnoses   Final diagnoses:  Perianal abscess    ED Discharge Orders    None       Elizabeth PalauMorelli, Roshaun Pound A, PA-C 07/30/18 1753    Bethann BerkshireZammit, Joseph, MD 08/01/18 1209

## 2018-07-30 NOTE — ED Triage Notes (Signed)
Patient to ED c/o recurrent abscess - reports he is a patient [facility] and is supposed to have some procedure to clean existing abscess out but states new abscess popped up L medial buttock x 3 days. Denies fevers/chills.

## 2018-07-30 NOTE — H&P (Signed)
Martin Rose is an 42 y.o. male.   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  He has surgery by Dr. Hulen Skains for incision and drainage of a perianal abscess on 03/22/2018.  He has recently seen Dr. Dema Severin in our office who is planning eventual surgery for a fistula and a note.  Over the last several days, he has been having worsening perianal discomfort at the area of his previous abscess.  He has just developed some chills in the emergency department as well.  Bowel movements have been normal.  He just had something to eat just a few hours ago.  He is otherwise without complaints.  Past Medical History:  Diagnosis Date  . Hypertension     Past Surgical History:  Procedure Laterality Date  . INCISION AND DRAINAGE PERIRECTAL ABSCESS  03/22/2018  . INCISION AND DRAINAGE PERIRECTAL ABSCESS Left 03/22/2018   Procedure: IRRIGATION AND DEBRIDEMENT PERIRECTAL ABSCESS;  Surgeon: Judeth Horn, MD;  Location: [place];  Service: General;  Laterality: Left;    Family History  Problem Relation Age of Onset  . Hypertension Mother   . Hypertension Father   . Cancer Sister 30       brain tumor  . Stroke Sister    Social History:  reports that he quit smoking about 9 years ago. His smoking use included cigarettes. He has a 7.50 pack-year smoking history. He has never used smokeless tobacco. He reports that he drinks about 24.0 standard drinks of alcohol per week. He reports that he has current or past drug history. Drug: Marijuana.  Allergies: No Known Allergies   (Not in a hospital admission)  Results for orders placed or performed during the hospital encounter of 07/30/18 (from the past 48 hour(s))  CBC with Differential     Status: Abnormal   Collection Time: 07/30/18 12:33 PM  Result Value Ref Range   WBC 13.8 (H) 4.0 - 10.5 K/uL   RBC 4.73 4.22 - 5.81 MIL/uL   Hemoglobin 12.8 (L) 13.0 -  17.0 g/dL   HCT 40.6 39.0 - 52.0 %   MCV 85.8 78.0 - 100.0 fL   MCH 27.1 26.0 - 34.0 pg   MCHC 31.5 30.0 - 36.0 g/dL   RDW 13.7 11.5 - 15.5 %   Platelets 316 150 - 400 K/uL   Neutrophils Relative % 69 %   Neutro Abs 9.5 (H) 1.7 - 7.7 K/uL   Lymphocytes Relative 19 %   Lymphs Abs 2.6 0.7 - 4.0 K/uL   Monocytes Relative 10 %   Monocytes Absolute 1.4 (H) 0.1 - 1.0 K/uL   Eosinophils Relative 2 %   Eosinophils Absolute 0.3 0.0 - 0.7 K/uL   Basophils Relative 0 %   Basophils Absolute 0.1 0.0 - 0.1 K/uL   Immature Granulocytes 0 %   Abs Immature Granulocytes 0.1 0.0 - 0.1 K/uL    Comment: Performed at [facility], [address] [place], [place] [address]  Basic metabolic panel     Status: Abnormal   Collection Time: 07/30/18 12:33 PM  Result Value Ref Range   Sodium 142 135 - 145 mmol/L   Potassium 4.7 3.5 - 5.1 mmol/L   Chloride 107 98 - 111 mmol/L   CO2 26 22 - 32 mmol/L   Glucose, Bld 119 (H) 70 - 99 mg/dL   BUN <5 (L) 6 - 20 mg/dL  Creatinine, Ser 1.07 0.61 - 1.24 mg/dL   Calcium 9.2 8.9 - 10.3 mg/dL   GFR calc non Af Amer >60 >60 mL/min   GFR calc Af Amer >60 >60 mL/min    Comment: (NOTE) The eGFR has been calculated using the CKD EPI equation. This calculation has not been validated in all clinical situations. eGFR's persistently <60 mL/min signify possible Chronic Kidney Disease.    Anion gap 9 5 - 15    Comment: Performed at [facility] [address] [place], [place] [address]   Ct Pelvis W Contrast  Result Date: 07/30/2018 CLINICAL DATA:  Left medial buttock abscess. EXAM: CT PELVIS WITH CONTRAST TECHNIQUE: Multidetector CT imaging of the pelvis was performed using the standard protocol following the bolus administration of intravenous contrast. CONTRAST:  145m ISOVUE-300 IOPAMIDOL (ISOVUE-300) INJECTION 61% COMPARISON:  03/22/2018 FINDINGS: Urinary Tract:  No abnormality visualized. Bowel:  Unremarkable visualized pelvic bowel loops.  Vascular/Lymphatic: No pathologically enlarged lymph nodes. No significant vascular abnormality seen. Reproductive:  No mass or other significant abnormality Other: There is a left medial buttock perianal abscess which measures 8.0 x 4.0 x 6.5 cm. No definite simple fluid component is seen. There is surrounding skin thickening and subcutaneous fat stranding. The process is extraperitoneal, and in a similar location to the abscess demonstrated by CT in April 2019. Musculoskeletal: No suspicious bone lesions identified. IMPRESSION: Left medial buttock perianal abscess which measures up to 8 cm. No clear fluid component is seen. Electronically Signed   By: DFidela SalisburyM.D.   On: 07/30/2018 16:39    Review of Systems  Constitutional: Positive for chills.  Respiratory: Negative for cough and shortness of breath.   Cardiovascular: Negative for chest pain.  Gastrointestinal: Negative for abdominal pain, diarrhea, nausea and vomiting.  All other systems reviewed and are negative.   Blood pressure 126/82, pulse 70, temperature 98.3 F (36.8 C), temperature source Oral, resp. rate 18, height 6' (1.829 m), weight 127 kg, SpO2 100 %. Physical Exam  Constitutional: He is oriented to person, place, and time. He appears well-developed and well-nourished. No distress.  HENT:  Head: Normocephalic and atraumatic.  Right Ear: External ear normal.  Left Ear: External ear normal.  Nose: Nose normal.  Mouth/Throat: Oropharynx is clear and moist. No oropharyngeal exudate.  Eyes: Pupils are equal, round, and reactive to light. Right eye exhibits no discharge. Left eye exhibits no discharge. No scleral icterus.  Neck: Normal range of motion. Neck supple. No tracheal deviation present.  Cardiovascular: Normal rate, regular rhythm and normal heart sounds.  Respiratory: Effort normal and breath sounds normal. No respiratory distress.  GI: Soft. There is no tenderness. There is no rebound.  Genitourinary:   Genitourinary Comments: Large right-sided perianal abscess tenderness  Neurological: He is alert and oriented to person, place, and time.  Skin: Skin is warm and dry.  Psychiatric: His behavior is normal. Judgment normal.     Assessment/Plan Recurrent perianal abscess  I discussed the diagnosis with the patient and his wife and they are well aware of the situation.  He will be admitted and placed on IV antibiotics with the plans to proceed hopefully to the operating room tomorrow.  I will discuss this with Dr. WHulen Skainsand Dr. WDema Severinin the morning as to who will be doing his surgery.  He understands that the definitive surgery on the fistula may not be possible at this setting and just drainage of the abscess.  They understand and agree with the  admission.  Harl Bowie, MD 07/30/2018, 5:49 PM

## 2018-07-30 NOTE — ED Notes (Signed)
Patient returned from CT

## 2018-07-31 ENCOUNTER — Inpatient Hospital Stay (HOSPITAL_COMMUNITY): Payer: No Typology Code available for payment source | Admitting: Certified Registered Nurse Anesthetist

## 2018-07-31 ENCOUNTER — Encounter (HOSPITAL_COMMUNITY): Payer: Self-pay | Admitting: *Deleted

## 2018-07-31 ENCOUNTER — Other Ambulatory Visit: Payer: Self-pay

## 2018-07-31 ENCOUNTER — Encounter (HOSPITAL_COMMUNITY): Admission: EM | Disposition: A | Discharge status: Home / Self Care | Payer: Self-pay | Source: Home / Self Care

## 2018-07-31 HISTORY — PX: INCISION AND DRAINAGE PERIRECTAL ABSCESS: SHX1804

## 2018-07-31 LAB — BASIC METABOLIC PANEL
ANION GAP: 9 (ref 5–15)
BUN: 7 mg/dL (ref 6–20)
CHLORIDE: 108 mmol/L (ref 98–111)
CO2: 27 mmol/L (ref 22–32)
Calcium: 8.7 mg/dL — ABNORMAL LOW (ref 8.9–10.3)
Creatinine, Ser: 1.18 mg/dL (ref 0.61–1.24)
GFR calc Af Amer: >60 mL/min (ref >60)
GFR calc non Af Amer: >60 mL/min (ref >60)
Glucose, Bld: 94 mg/dL (ref 70–99)
POTASSIUM: 4.2 mmol/L (ref 3.5–5.1)
SODIUM: 144 mmol/L (ref 135–145)

## 2018-07-31 LAB — SURGICAL PCR SCREEN
MRSA, PCR: NEGATIVE
STAPHYLOCOCCUS AUREUS: NEGATIVE

## 2018-07-31 LAB — CBC
HEMATOCRIT: 37.6 % — AB (ref 39.0–52.0)
HEMOGLOBIN: 12 g/dL — AB (ref 13.0–17.0)
MCH: 26.8 pg (ref 26.0–34.0)
MCHC: 31.9 g/dL (ref 30.0–36.0)
MCV: 83.9 fL (ref 78.0–100.0)
Platelets: 289 10*3/uL (ref 150–400)
RBC: 4.48 MIL/uL (ref 4.22–5.81)
RDW: 13.2 % (ref 11.5–15.5)
WBC: 13.2 10*3/uL — AB (ref 4.0–10.5)

## 2018-07-31 LAB — HIV ANTIBODY (ROUTINE TESTING W REFLEX): HIV SCREEN 4TH GENERATION: NONREACTIVE

## 2018-07-31 SURGERY — INCISION AND DRAINAGE, ABSCESS, PERIRECTAL
Anesthesia: General | Site: Rectum

## 2018-07-31 MED ORDER — HYDROMORPHONE HCL 1 MG/ML IJ SOLN
INTRAMUSCULAR | Status: AC
  Filled 2018-07-31: qty 1

## 2018-07-31 MED ORDER — SUGAMMADEX SODIUM 200 MG/2ML IV SOLN
INTRAVENOUS | Status: DC | PRN
  Administered 2018-07-31: 400 mg via INTRAVENOUS
  Administered 2018-07-31: 100 mg via INTRAVENOUS

## 2018-07-31 MED ORDER — OXYCODONE HCL 5 MG PO TABS
ORAL_TABLET | ORAL | Status: AC
  Filled 2018-07-31: qty 2

## 2018-07-31 MED ORDER — LIDOCAINE 2% (20 MG/ML) 5 ML SYRINGE
INTRAMUSCULAR | Status: DC | PRN
  Administered 2018-07-31: 100 mg via INTRAVENOUS

## 2018-07-31 MED ORDER — HYDROMORPHONE HCL 1 MG/ML IJ SOLN
0.2500 mg | INTRAMUSCULAR | Status: DC | PRN
  Administered 2018-07-31 (×2): 0.5 mg via INTRAVENOUS

## 2018-07-31 MED ORDER — MIDAZOLAM HCL 2 MG/2ML IJ SOLN
INTRAMUSCULAR | Status: AC
  Filled 2018-07-31: qty 2

## 2018-07-31 MED ORDER — ROCURONIUM BROMIDE 10 MG/ML (PF) SYRINGE
PREFILLED_SYRINGE | INTRAVENOUS | Status: DC | PRN
  Administered 2018-07-31: 50 mg via INTRAVENOUS

## 2018-07-31 MED ORDER — LACTATED RINGERS IV SOLN
INTRAVENOUS | Status: DC
Start: 2018-07-31 — End: 2018-08-02
  Administered 2018-07-31 (×2): via INTRAVENOUS

## 2018-07-31 MED ORDER — 0.9 % SODIUM CHLORIDE (POUR BTL) OPTIME
TOPICAL | Status: DC | PRN
  Administered 2018-07-31: 1000 mL

## 2018-07-31 MED ORDER — PROPOFOL 10 MG/ML IV BOLUS
INTRAVENOUS | Status: AC
  Filled 2018-07-31: qty 20

## 2018-07-31 MED ORDER — FENTANYL CITRATE (PF) 100 MCG/2ML IJ SOLN
INTRAMUSCULAR | Status: DC | PRN
  Administered 2018-07-31: 150 ug via INTRAVENOUS
  Administered 2018-07-31: 100 ug via INTRAVENOUS

## 2018-07-31 MED ORDER — SUCCINYLCHOLINE CHLORIDE 200 MG/10ML IV SOSY
PREFILLED_SYRINGE | INTRAVENOUS | Status: DC | PRN
  Administered 2018-07-31: 180 mg via INTRAVENOUS

## 2018-07-31 MED ORDER — ONDANSETRON HCL 4 MG/2ML IJ SOLN
INTRAMUSCULAR | Status: DC | PRN
  Administered 2018-07-31: 4 mg via INTRAVENOUS

## 2018-07-31 MED ORDER — ATENOLOL 50 MG PO TABS
50.0000 mg | ORAL_TABLET | Freq: Every day | ORAL | Status: DC
  Administered 2018-07-31 – 2018-08-02 (×3): 50 mg via ORAL
  Filled 2018-07-31 (×3): qty 1

## 2018-07-31 MED ORDER — LIDOCAINE 2% (20 MG/ML) 5 ML SYRINGE
INTRAMUSCULAR | Status: AC
  Filled 2018-07-31: qty 5

## 2018-07-31 MED ORDER — OXYCODONE HCL 5 MG PO TABS: 5.0000 mg | ORAL_TABLET | Freq: Four times a day (QID) | ORAL | Status: DC | PRN

## 2018-07-31 MED ORDER — AMLODIPINE BESYLATE 10 MG PO TABS
10.0000 mg | ORAL_TABLET | Freq: Every day | ORAL | Status: DC
  Administered 2018-07-31 – 2018-08-02 (×3): 10 mg via ORAL
  Filled 2018-07-31 (×3): qty 1

## 2018-07-31 MED ORDER — FENTANYL CITRATE (PF) 250 MCG/5ML IJ SOLN
INTRAMUSCULAR | Status: AC
  Filled 2018-07-31: qty 5

## 2018-07-31 MED ORDER — PROPOFOL 10 MG/ML IV BOLUS
INTRAVENOUS | Status: DC | PRN
  Administered 2018-07-31: 150 mg via INTRAVENOUS

## 2018-07-31 MED ORDER — DEXAMETHASONE SODIUM PHOSPHATE 10 MG/ML IJ SOLN
INTRAMUSCULAR | Status: DC | PRN
  Administered 2018-07-31: 10 mg via INTRAVENOUS

## 2018-07-31 MED ORDER — MIDAZOLAM HCL 2 MG/2ML IJ SOLN
INTRAMUSCULAR | Status: DC | PRN
  Administered 2018-07-31: 2 mg via INTRAVENOUS

## 2018-07-31 SURGICAL SUPPLY — 30 items
CANISTER SUCT 3000ML PPV (MISCELLANEOUS) ×2 IMPLANT
CLEANER TIP ELECTROSURG 2X2 (MISCELLANEOUS) ×2 IMPLANT
COVER SURGICAL LIGHT HANDLE (MISCELLANEOUS) ×2 IMPLANT
DRSG PAD ABDOMINAL 8X10 ST (GAUZE/BANDAGES/DRESSINGS) ×2 IMPLANT
ELECT REM PT RETURN 9FT ADLT (ELECTROSURGICAL) ×2
ELECTRODE REM PT RTRN 9FT ADLT (ELECTROSURGICAL) ×1 IMPLANT
GAUZE 4X4 16PLY RFD (DISPOSABLE) ×2 IMPLANT
GAUZE PACKING IODOFORM 1 (PACKING) IMPLANT
GAUZE PACKING IODOFORM 1X5 (MISCELLANEOUS) ×2 IMPLANT
GAUZE SPONGE 4X4 12PLY STRL (GAUZE/BANDAGES/DRESSINGS) ×2 IMPLANT
GLOVE BIOGEL PI IND STRL 8 (GLOVE) ×1 IMPLANT
GLOVE BIOGEL PI INDICATOR 8 (GLOVE) ×1
GLOVE ECLIPSE 7.5 STRL STRAW (GLOVE) ×2 IMPLANT
GOWN STRL REUS W/ TWL LRG LVL3 (GOWN DISPOSABLE) ×2 IMPLANT
GOWN STRL REUS W/TWL LRG LVL3 (GOWN DISPOSABLE) ×4
KIT BASIN OR (CUSTOM PROCEDURE TRAY) ×2 IMPLANT
KIT TURNOVER KIT B (KITS) ×2 IMPLANT
NS IRRIG 1000ML POUR BTL (IV SOLUTION) ×2 IMPLANT
PACK LITHOTOMY IV (CUSTOM PROCEDURE TRAY) ×2 IMPLANT
PAD ABD 8X10 STRL (GAUZE/BANDAGES/DRESSINGS) ×1 IMPLANT
PAD ARMBOARD 7.5X6 YLW CONV (MISCELLANEOUS) ×2 IMPLANT
PENCIL BUTTON HOLSTER BLD 10FT (ELECTRODE) ×2 IMPLANT
SURGILUBE 2OZ TUBE FLIPTOP (MISCELLANEOUS) ×2 IMPLANT
SWAB COLLECTION DEVICE MRSA (MISCELLANEOUS) IMPLANT
SWAB CULTURE ESWAB REG 1ML (MISCELLANEOUS) IMPLANT
TOWEL OR 17X24 6PK STRL BLUE (TOWEL DISPOSABLE) ×2 IMPLANT
TOWEL OR 17X26 10 PK STRL BLUE (TOWEL DISPOSABLE) ×2 IMPLANT
TUBE CONNECTING 12X1/4 (SUCTIONS) ×2 IMPLANT
UNDERPAD 30X30 (UNDERPADS AND DIAPERS) ×2 IMPLANT
YANKAUER SUCT BULB TIP NO VENT (SUCTIONS) ×2 IMPLANT

## 2018-07-31 NOTE — Anesthesia Preprocedure Evaluation (Addendum)
Anesthesia Evaluation  Patient identified by MRN, date of birth, ID band Patient awake    Reviewed: Allergy & Precautions, NPO status , Patient's Chart, lab work & pertinent test results  Airway Mallampati: I  TM Distance: >3 FB Neck ROM: Full    Dental no notable dental hx. (+) Teeth Intact, Dental Advisory Given   Pulmonary neg pulmonary ROS, former smoker,    Pulmonary exam normal breath sounds clear to auscultation       Cardiovascular hypertension, Pt. on medications Normal cardiovascular exam Rhythm:Regular Rate:Normal  TTE 2012 Normal LV size with moderate LV hypertrophy. EF 55-60%. Moderate diastolic dysfunction. Normal RV size and systolic function. No valvular abnormalities.   Neuro/Psych negative neurological ROS  negative psych ROS   GI/Hepatic Neg liver ROS, Perirectal abscess   Endo/Other  negative endocrine ROSMorbid obesity  Renal/GU negative Renal ROS  negative genitourinary   Musculoskeletal negative musculoskeletal ROS (+)   Abdominal   Peds  Hematology negative hematology ROS (+)   Anesthesia Other Findings   Reproductive/Obstetrics                            Anesthesia Physical Anesthesia Plan  ASA: II  Anesthesia Plan: General   Post-op Pain Management:    Induction: Intravenous  PONV Risk Score and Plan: 2 and Dexamethasone, Ondansetron and Midazolam  Airway Management Planned: Oral ETT  Additional Equipment:   Intra-op Plan:   Post-operative Plan: Extubation in OR  Informed Consent: I have reviewed the patients History and Physical, chart, labs and discussed the procedure including the risks, benefits and alternatives for the proposed anesthesia with the patient or authorized representative who has indicated his/her understanding and acceptance.   Dental advisory given  Plan Discussed with: CRNA  Anesthesia Plan Comments:         Anesthesia  Quick Evaluation

## 2018-07-31 NOTE — Transfer of Care (Signed)
Immediate Anesthesia Transfer of Care Note  Patient: Martin Rose  Procedure(s) Performed: IRRIGATION AND DEBRIDEMENT PERIRECTAL ABSCESS (N/A Rectum)  Patient Location: PACU  Anesthesia Type:General  Level of Consciousness: awake, alert  and oriented  Airway & Oxygen Therapy: Patient Spontanous Breathing  Post-op Assessment: Report given to RN and Post -op Vital signs reviewed and stable  Post vital signs: Reviewed and stable  Last Vitals:  Vitals Value Taken Time  BP    Temp    Pulse 99 07/31/2018  2:34 PM  Resp 14 07/31/2018  2:34 PM  SpO2 100 % 07/31/2018  2:34 PM  Vitals shown include unvalidated device data.  Last Pain:  Vitals:   07/31/18 1207  TempSrc: Oral  PainSc:       Patients Stated Pain Goal: 2 (07/30/18 1832)  Complications: No apparent anesthesia complications

## 2018-07-31 NOTE — Op Note (Signed)
OPERATIVE REPORT  DATE OF OPERATION: 07/31/2018  PATIENT:  Martin Rose  42 y.o. male  PRE-OPERATIVE DIAGNOSIS:  perirectal abscess  POST-OPERATIVE DIAGNOSIS:  perirectal abscess  INDICATION(S) FOR OPERATION:  Recurrent left sided perirectal abscess  FINDINGS:  Large left gluteal perirectal abscess  PROCEDURE:  Procedure(s): IRRIGATION AND DEBRIDEMENT PERIRECTAL ABSCESS  SURGEON:  Surgeon(s): Jimmye NormanWyatt, Mishka Stegemann, MD  ASSISTANT: None  ANESTHESIA:   general  COMPLICATIONS:  None  EBL: <10 ml  BLOOD ADMINISTERED: none  DRAINS: none   SPECIMEN:  No Specimen  COUNTS CORRECT:  YES  PROCEDURE DETAILS: The patient was taken to the operating room and placed on the table initially in the supine position.  After adequate general endotracheal anesthetic was administered he was placed in lithotomy position prepped and draped in usual sterile manner.  A proper timeout was performed identifying the patient and procedure to be performed.  The patient had a very fluctuant mass and fullness of his left gluteal cheek.  We incised the area with a 15 blade and opened up with a hemostat clamp and the surgeon's fingers.  We entered the cavity which was probably at least 100 cm in size.  We packed it after irrigating it copiously with saline using 2 bottles of 1 inch iodoform Nu Gauze.  All counts were correct and a sterile dressing was applied.  No cultures were sent.  The effluent was foul-smelling and reddish-brown in color.  PATIENT DISPOSITION:  PACU - hemodynamically stable.   Jimmye NormanJames Cammi Consalvo 8/19/20192:26 PM

## 2018-07-31 NOTE — Anesthesia Procedure Notes (Signed)
Procedure Name: Intubation Date/Time: 07/31/2018 1:59 PM Performed by: Leonor Liv, CRNA Pre-anesthesia Checklist: Patient identified, Emergency Drugs available, Suction available and Patient being monitored Patient Re-evaluated:Patient Re-evaluated prior to induction Oxygen Delivery Method: Circle System Utilized Preoxygenation: Pre-oxygenation with 100% oxygen Induction Type: IV induction Ventilation: Mask ventilation without difficulty Laryngoscope Size: Mac and 4 Grade View: Grade I Tube type: Oral Tube size: 7.5 mm Number of attempts: 1 Airway Equipment and Method: Stylet and Oral airway Placement Confirmation: ETT inserted through vocal cords under direct vision,  positive ETCO2 and breath sounds checked- equal and bilateral Secured at: 23 cm Tube secured with: Tape Dental Injury: Teeth and Oropharynx as per pre-operative assessment

## 2018-07-31 NOTE — Progress Notes (Signed)
Returned back from PACU, A/Ox4, denies nausea/pain, wife at bedside

## 2018-07-31 NOTE — Progress Notes (Signed)
Subjective No acute events. Stable perianal discomfort. Planning drainage later today of perianal abscess  Objective: Vital signs in last 24 hours: Temp:  [98 F (36.7 C)-99.6 F (37.6 C)] 99.6 F (37.6 C) (08/19 0406) Pulse Rate:  [62-84] 76 (08/19 0406) Resp:  [16-20] 20 (08/19 0406) BP: (112-156)/(61-105) 120/66 (08/19 0406) SpO2:  [98 %-100 %] 98 % (08/19 0406) Weight:  [782[127 kg] 127 kg (08/18 0957) Last BM Date: 07/30/18  Intake/Output from previous day: 08/18 0701 - 08/19 0700 In: 2379.3 [P.O.:370; I.V.:949.6; IV Piggyback:1059.6] Out: 425 [Urine:425] Intake/Output this shift: No intake/output data recorded.  Gen: NAD, comfortable CV: RRR Pulm: Normal work of breathing Abd: Soft, NT/ND Anorectal: Large right sided perianal abscess Ext: SCDs in place  Lab Results: CBC  Recent Labs    07/30/18 1233 07/31/18 0521  WBC 13.8* 13.2*  HGB 12.8* 12.0*  HCT 40.6 37.6*  PLT 316 289   BMET Recent Labs    07/30/18 1233 07/31/18 0521  NA 142 144  K 4.7 4.2  CL 107 108  CO2 26 27  GLUCOSE 119* 94  BUN <5* 7  CREATININE 1.07 1.18  CALCIUM 9.2 8.7*   PT/INR No results for input(s): LABPROT, INR in the last 72 hours. ABG No results for input(s): PHART, HCO3 in the last 72 hours.  Invalid input(s): PCO2, PO2  Studies/Results:  Anti-infectives: Anti-infectives (From admission, onward)   Start     Dose/Rate Route Frequency Ordered Stop   07/30/18 2200  piperacillin-tazobactam (ZOSYN) IVPB 3.375 g     3.375 g 12.5 mL/hr over 240 Minutes Intravenous Every 8 hours 07/30/18 2023         Assessment/Plan: Patient Active Problem List   Diagnosis Date Noted  . Perianal abscess 07/30/2018  . Skin rash 05/22/2018  . Peri-rectal abscess 03/23/2018  . Rectal abscess 03/22/2018  . Perirectal abscess 03/22/2018  . Palpitations 07/11/2017  . Alcohol use 07/11/2017  . Allergic rhinitis 06/24/2015  . Morbid obesity (HCC) 04/30/2013  . Snoring 04/30/2013  .  Hidradenitis 09/04/2012  . Essential hypertension, benign 03/10/2009   Planning OR later today for I&D of perianal abscess  -He was recommended EUA but had yet to schedule his procedure with us for unclear reasons -He is scheduled later today with Dr. Lindie SpruceWyatt, my partner -The anatomy and physiology of the anal canal was discussed at length with the patient in the office and today. The pathophysiology of abscesses/fistula was discussed at length with associated pictures and illustrations. -The planned procedure, material risks (including, but not limited to, pain, bleeding, infection, scarring, need for blood transfusion, damage to anal sphincter, incontinence of gas and/or stool, need for additional procedures, recurrence, pneumonia, heart attack, stroke, death) benefits and alternatives to surgery were discussed at length. I noted a good probability that the procedure would help improve their symptoms. The patient's questions were answered to their satisfaction, they voiced understanding and elected to proceed with surgery. Additionally, we discussed typical postoperative expectations and the recovery process.   LOS: 1 day   Stephanie Couphristopher M. Cliffton AstersWhite, M.D. [facility]  Note: This dictation was prepared with Dragon/digital dictation along with [employer] technology. Any transcriptional errors that result from this process are unintentional.

## 2018-08-01 ENCOUNTER — Encounter (HOSPITAL_COMMUNITY): Payer: Self-pay | Admitting: General Surgery

## 2018-08-01 MED ORDER — HYDROMORPHONE HCL 1 MG/ML IJ SOLN
0.5000 mg | INTRAMUSCULAR | Status: DC | PRN
  Administered 2018-08-02: 2 mg via INTRAVENOUS
  Filled 2018-08-01: qty 2

## 2018-08-01 MED ORDER — METHOCARBAMOL 500 MG PO TABS
500.0000 mg | ORAL_TABLET | Freq: Three times a day (TID) | ORAL | Status: DC
  Administered 2018-08-01 – 2018-08-02 (×4): 500 mg via ORAL
  Filled 2018-08-01 (×4): qty 1

## 2018-08-01 MED ORDER — ACETAMINOPHEN 500 MG PO TABS
1000.0000 mg | ORAL_TABLET | Freq: Four times a day (QID) | ORAL | Status: DC
  Administered 2018-08-01 (×3): 1000 mg via ORAL
  Administered 2018-08-02: 500 mg via ORAL
  Filled 2018-08-01 (×5): qty 2

## 2018-08-01 MED ORDER — IBUPROFEN 600 MG PO TABS
600.0000 mg | ORAL_TABLET | Freq: Three times a day (TID) | ORAL | Status: DC
  Administered 2018-08-01 – 2018-08-02 (×4): 600 mg via ORAL
  Filled 2018-08-01 (×4): qty 1

## 2018-08-01 NOTE — Anesthesia Postprocedure Evaluation (Signed)
Anesthesia Post Note  Patient: Martin Rose  Procedure(s) Performed: IRRIGATION AND DEBRIDEMENT PERIRECTAL ABSCESS (N/A Rectum)     Patient location during evaluation: PACU Anesthesia Type: General Level of consciousness: awake and alert Pain management: pain level controlled Vital Signs Assessment: post-procedure vital signs reviewed and stable Respiratory status: spontaneous breathing, nonlabored ventilation, respiratory function stable and patient connected to nasal cannula oxygen Cardiovascular status: blood pressure returned to baseline and stable Postop Assessment: no apparent nausea or vomiting Anesthetic complications: no    Last Vitals:  Vitals:   07/31/18 2204 08/01/18 0604  BP: (!) 139/96 121/82  Pulse: 69 (!) 52  Resp: 17 17  Temp: 37 C (!) 36.4 C  SpO2: 100% 100%    Last Pain:  Vitals:   08/01/18 0630  TempSrc:   PainSc: 3                  Dejah Droessler L Eliodoro Gullett

## 2018-08-01 NOTE — Progress Notes (Signed)
Patient ID: Martin MealingJovon Rose, male   DOB: 03-11-1976, 42 y.o.   MRN: [id number]    1 Day Post-Op  Subjective: Pt had quite a bit of pain overnight.  Otherwise no other new complaints.    Objective: Vital signs in last 24 hours: Temp:  [97.5 F (36.4 C)-99.2 F (37.3 C)] 97.5 F (36.4 C) (08/20 0604) Pulse Rate:  [52-99] 52 (08/20 0604) Resp:  [14-26] 17 (08/20 0604) BP: (121-148)/(68-96) 121/82 (08/20 0604) SpO2:  [95 %-100 %] 100 % (08/20 0604) Last BM Date: 07/30/18  Intake/Output from previous day: 08/19 0701 - 08/20 0700 In: 3075.1 [P.O.:680; I.V.:2339.2; IV Piggyback:55.9] Out: 930 [Urine:925; Blood:5] Intake/Output this shift: No intake/output data recorded.  PE: Skin: buttock dressing with some drainage as expected.  Still very tender.  Packing left in place today.  Lab Results:  Recent Labs    07/30/18 1233 07/31/18 0521  WBC 13.8* 13.2*  HGB 12.8* 12.0*  HCT 40.6 37.6*  PLT 316 289   BMET Recent Labs    07/30/18 1233 07/31/18 0521  NA 142 144  K 4.7 4.2  CL 107 108  CO2 26 27  GLUCOSE 119* 94  BUN <5* 7  CREATININE 1.07 1.18  CALCIUM 9.2 8.7*   PT/INR No results for input(s): LABPROT, INR in the last 72 hours. CMP     Component Value Date/Time   NA 144 07/31/2018 0521   K 4.2 07/31/2018 0521   CL 108 07/31/2018 0521   CO2 27 07/31/2018 0521   GLUCOSE 94 07/31/2018 0521   BUN 7 07/31/2018 0521   CREATININE 1.18 07/31/2018 0521   CREATININE 1.08 08/09/2016 1506   CALCIUM 8.7 (L) 07/31/2018 0521   PROT 7.5 03/22/2018 1510   ALBUMIN 3.7 03/22/2018 1510   AST 18 03/22/2018 1510   ALT 18 03/22/2018 1510   ALKPHOS 84 03/22/2018 1510   BILITOT 0.7 03/22/2018 1510   GFRNONAA >60 07/31/2018 0521   GFRNONAA 85 08/09/2016 1506   GFRAA >60 07/31/2018 0521   GFRAA >89 08/09/2016 1506   Lipase  No results found for: LIPASE     Studies/Results: Ct Pelvis W Contrast  Result Date: 07/30/2018 CLINICAL DATA:  Left medial buttock abscess. EXAM:  CT PELVIS WITH CONTRAST TECHNIQUE: Multidetector CT imaging of the pelvis was performed using the standard protocol following the bolus administration of intravenous contrast. CONTRAST:  100mL ISOVUE-300 IOPAMIDOL (ISOVUE-300) INJECTION 61% COMPARISON:  03/22/2018 FINDINGS: Urinary Tract:  No abnormality visualized. Bowel:  Unremarkable visualized pelvic bowel loops. Vascular/Lymphatic: No pathologically enlarged lymph nodes. No significant vascular abnormality seen. Reproductive:  No mass or other significant abnormality Other: There is a left medial buttock perianal abscess which measures 8.0 x 4.0 x 6.5 cm. No definite simple fluid component is seen. There is surrounding skin thickening and subcutaneous fat stranding. The process is extraperitoneal, and in a similar location to the abscess demonstrated by CT in April 2019. Musculoskeletal: No suspicious bone lesions identified. IMPRESSION: Left medial buttock perianal abscess which measures up to 8 cm. No clear fluid component is seen. Electronically Signed   By: Ted Mcalpineobrinka  Dimitrova M.D.   On: 07/30/2018 16:39    Anti-infectives: Anti-infectives (From admission, onward)   Start     Dose/Rate Route Frequency Ordered Stop   07/30/18 2200  piperacillin-tazobactam (ZOSYN) IVPB 3.375 g     3.375 g 12.5 mL/hr over 240 Minutes Intravenous Every 8 hours 07/30/18 2023         Assessment/Plan Perianal abscess, POD 1, s/p I&D  Adjust pain medications today as morphine is not helping him.  Add scheduled tylenol, ibuprofen (taking this at home), robaxin, prn dilaudid and oxy. -plan to remove packing tomorrow -mobilize and pulm toilet -decrease IVFs today -check BMET in am to eval cr given use of ibuprofen at home and here.  Cr 1.18 yesterday. -cont zosyn -check CBC in am  FEN - decrease IVFs/regular diet VTE - SCDs/Lovenox ID - zosyn 8/18 -->   LOS: 2 days    Martin Rose , [facility] 08/01/2018, 10:20 AM Pager:  [phone]

## 2018-08-02 LAB — BASIC METABOLIC PANEL
ANION GAP: 8 (ref 5–15)
BUN: 9 mg/dL (ref 6–20)
CALCIUM: 8.5 mg/dL — AB (ref 8.9–10.3)
CO2: 24 mmol/L (ref 22–32)
CREATININE: 1.02 mg/dL (ref 0.61–1.24)
Chloride: 106 mmol/L (ref 98–111)
Glucose, Bld: 106 mg/dL — ABNORMAL HIGH (ref 70–99)
Potassium: 4.1 mmol/L (ref 3.5–5.1)
Sodium: 138 mmol/L (ref 135–145)

## 2018-08-02 MED ORDER — OXYCODONE HCL 5 MG PO TABS: 5.0000 mg | ORAL_TABLET | ORAL | 0 refills | Status: DC | PRN

## 2018-08-02 MED ORDER — METHOCARBAMOL 500 MG PO TABS: 500.0000 mg | ORAL_TABLET | Freq: Three times a day (TID) | ORAL | 0 refills | Status: DC | PRN

## 2018-08-02 MED ORDER — AMOXICILLIN-POT CLAVULANATE 875-125 MG PO TABS: 1.0000 | ORAL_TABLET | Freq: Two times a day (BID) | ORAL | 0 refills | Status: AC

## 2018-08-02 MED ORDER — IBUPROFEN 200 MG PO TABS: 200.0000 mg | ORAL_TABLET | Freq: Four times a day (QID) | ORAL | 0 refills | Status: DC | PRN

## 2018-08-02 NOTE — Progress Notes (Signed)
Patient discharged to home with instructions and prescription. 

## 2018-08-02 NOTE — Discharge Instructions (Signed)
Get in the bathtub on Friday and soak your bottom.  Then remove remaining gauze packing.  No further packing needs to be replaced.  Dry bandages can then be placed over this area to protect your clothes from drainage.  How to Take a Sitz Bath A sitz bath is a warm water bath that is taken while you are sitting down. The water should only come up to your hips and should cover your buttocks. Your health care provider may recommend a sitz bath to help you:  Clean the lower part of your body, including your genital area.  With itching.  With pain.  With sore muscles or muscles that tighten or spasm.  How to take a sitz bath Take 3-4 sitz baths per day or as told by your health care provider. 1. Partially fill a bathtub with warm water. You will only need the water to be deep enough to cover your hips and buttocks when you are sitting in it. 2. If your health care provider told you to put medicine in the water, follow the directions exactly. 3. Sit in the water and open the tub drain a little. 4. Turn on the warm water again to keep the tub at the correct level. Keep the water running constantly. 5. Soak in the water for 15-20 minutes or as told by your health care provider. 6. After the sitz bath, pat the affected area dry first. Do not rub it. 7. Be careful when you stand up after the sitz bath because you may feel dizzy.  Contact a health care provider if:  Your symptoms get worse. Do not continue with sitz baths if your symptoms get worse.  You have new symptoms. Do not continue with sitz baths until you talk with your health care provider. This information is not intended to replace advice given to you by your health care provider. Make sure you discuss any questions you have with your health care provider. Document Released: 08/21/2004 Document Revised: 04/28/2016 Document Reviewed: 11/27/2014 Elsevier Interactive Patient Education  [employer]2018 Elsevier Inc.

## 2018-08-02 NOTE — Discharge Summary (Signed)
Patient ID: Earnestine MealingJovon Tax [id number] Aug 05, 1976 42 y.o.  Admit date: 07/30/2018 Discharge date: 08/02/2018  Admitting Diagnosis: Perirectal abscess  Discharge Diagnosis Patient Active Problem List   Diagnosis Date Noted  . Perianal abscess 07/30/2018  . Skin rash 05/22/2018  . Peri-rectal abscess 03/23/2018  . Rectal abscess 03/22/2018  . Perirectal abscess 03/22/2018  . Palpitations 07/11/2017  . Alcohol use 07/11/2017  . Allergic rhinitis 06/24/2015  . Morbid obesity (HCC) 04/30/2013  . Snoring 04/30/2013  . Hidradenitis 09/04/2012  . 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  He has surgery by Dr. Lindie SpruceWyatt for incision and drainage of a perianal abscess on 03/22/2018.  He has recently seen Dr. Cliffton AstersWhite in our office who is planning eventual surgery for a fistula and a note.  Over the last several days, he has been having worsening perianal discomfort at the area of his previous abscess.  He has just developed some chills in the emergency department as well.  Bowel movements have been normal.  He just had something to eat just a few hours ago.  He is otherwise without complaints.  Procedures Incision and drainage of perirectal abscess, Dr. Lindie SpruceWyatt 8/19  Hospital Course:  The patient was admitted and started on Zosyn.  He was taken to the OR the following day for the above procedure.  He tolerated this well.  His packing was left in place on POD 1 and removed on POD 2.  He tolerated this well and a small amount of packing was replaced.  This will be removed on Friday after soaking in the tub.  Cultures were not obtained.  5 days total of abx will be give.  He will be discharged on augmentin to complete this course.  He is otherwise stable for DC home at this time.    Physical Exam: Rectal: wound is clean with no purulent  drainage.  Wound is lightly repacked  Allergies as of 08/02/2018   No Known Allergies     Medication List    STOP taking these medications   meloxicam 15 MG tablet Commonly known as:  MOBIC     TAKE these medications   acetaminophen 500 MG tablet Commonly known as:  TYLENOL Take 2 tablets (1,000 mg total) by mouth every 6 (six) hours as needed.   amLODipine 10 MG tablet Commonly known as:  NORVASC Take 1 tablet (10 mg total) by mouth daily.   amoxicillin-clavulanate 875-125 MG tablet Commonly known as:  AUGMENTIN Take 1 tablet by mouth 2 (two) times daily for 2 days.   atenolol 50 MG tablet Commonly known as:  TENORMIN Take 1 tablet (50 mg total) by mouth daily.   ibuprofen 200 MG tablet Commonly known as:  ADVIL,MOTRIN Take 1-4 tablets (200-800 mg total) by mouth every 6 (six) hours as needed (for pain). What changed:  how much to take   methocarbamol 500 MG tablet Commonly known as:  ROBAXIN Take 1 tablet (500 mg total) by mouth every 8 (eight) hours as needed for muscle spasms.   oxyCODONE 5 MG immediate release tablet Commonly known as:  Oxy IR/ROXICODONE Take 1-2 tablets (5-10 mg total) by mouth every 4 (four) hours as needed for moderate pain. What changed:    how much to take  when to take this        Follow-up Information  [facility] 2 week(s).   Specialty:  General Surgery Why:  our office will call you with appointment date and time.  If you do not hear from them in a couple of days please call and confirm the time arranged for you Contact information: [address] [place] [address] [phone]           Signed: Barnetta ChapelKelly Elodie Panameno, [facility] 08/02/2018, 10:30 AM Pager: [phone]

## 2018-09-07 ENCOUNTER — Other Ambulatory Visit: Payer: Self-pay

## 2018-09-07 ENCOUNTER — Encounter (HOSPITAL_BASED_OUTPATIENT_CLINIC_OR_DEPARTMENT_OTHER): Payer: Self-pay | Admitting: *Deleted

## 2018-09-07 NOTE — Progress Notes (Signed)
Spoke w/ pt via phone for pre-op interview.  Npo after mn.  Arrive at [facility].  Needs istat and ekg.  Will take am meds dos w/ sips of water.

## 2018-09-11 ENCOUNTER — Encounter (HOSPITAL_BASED_OUTPATIENT_CLINIC_OR_DEPARTMENT_OTHER): Payer: Self-pay

## 2018-09-11 ENCOUNTER — Ambulatory Visit (HOSPITAL_BASED_OUTPATIENT_CLINIC_OR_DEPARTMENT_OTHER): Payer: BLUE CROSS/BLUE SHIELD | Admitting: Anesthesiology

## 2018-09-11 ENCOUNTER — Encounter (HOSPITAL_BASED_OUTPATIENT_CLINIC_OR_DEPARTMENT_OTHER)
Admission: RE | Disposition: A | Discharge status: Home / Self Care | Payer: Self-pay | Source: Ambulatory Visit | Attending: Surgery

## 2018-09-11 ENCOUNTER — Ambulatory Visit (HOSPITAL_BASED_OUTPATIENT_CLINIC_OR_DEPARTMENT_OTHER)
Admission: RE | Admit: 2018-09-11 | Discharge: 2018-09-11 | Disposition: A | Discharge status: Home / Self Care | Payer: BLUE CROSS/BLUE SHIELD | Source: Ambulatory Visit | Attending: Surgery | Admitting: Surgery

## 2018-09-11 DIAGNOSIS — Z79899 Other long term (current) drug therapy: Secondary | ICD-10-CM | POA: Insufficient documentation

## 2018-09-11 DIAGNOSIS — Z87891 Personal history of nicotine dependence: Secondary | ICD-10-CM | POA: Diagnosis not present

## 2018-09-11 DIAGNOSIS — E669 Obesity, unspecified: Secondary | ICD-10-CM | POA: Diagnosis not present

## 2018-09-11 DIAGNOSIS — K603 Anal fistula: Secondary | ICD-10-CM | POA: Insufficient documentation

## 2018-09-11 DIAGNOSIS — I10 Essential (primary) hypertension: Secondary | ICD-10-CM | POA: Diagnosis not present

## 2018-09-11 DIAGNOSIS — Z6838 Body mass index (BMI) 38.0-38.9, adult: Secondary | ICD-10-CM | POA: Insufficient documentation

## 2018-09-11 DIAGNOSIS — K610 Anal abscess: Secondary | ICD-10-CM | POA: Diagnosis present

## 2018-09-11 HISTORY — DX: Rectal fistula: K60.4

## 2018-09-11 HISTORY — DX: Personal history of other diseases of the digestive system: Z87.19

## 2018-09-11 HISTORY — DX: Palpitations: R00.2

## 2018-09-11 HISTORY — PX: PLACEMENT OF SETON: SHX6029

## 2018-09-11 HISTORY — DX: Rectal fistula, unspecified: K60.40

## 2018-09-11 HISTORY — PX: FISTULOTOMY: SHX6413

## 2018-09-11 LAB — POCT I-STAT 4, (NA,K, GLUC, HGB,HCT)
Glucose, Bld: 112 mg/dL — ABNORMAL HIGH (ref 70–99)
HCT: 41 % (ref 39.0–52.0)
HEMOGLOBIN: 13.9 g/dL (ref 13.0–17.0)
POTASSIUM: 4.4 mmol/L (ref 3.5–5.1)
Sodium: 142 mmol/L (ref 135–145)

## 2018-09-11 SURGERY — EXAM UNDER ANESTHESIA
Anesthesia: General | Site: Rectum

## 2018-09-11 MED ORDER — ONDANSETRON HCL 4 MG/2ML IJ SOLN
INTRAMUSCULAR | Status: AC
  Filled 2018-09-11: qty 2

## 2018-09-11 MED ORDER — HEPARIN SODIUM (PORCINE) 5000 UNIT/ML IJ SOLN
5000.0000 [IU] | Freq: Once | INTRAMUSCULAR | Status: AC
  Administered 2018-09-11: 5000 [IU] via SUBCUTANEOUS
  Filled 2018-09-11: qty 1

## 2018-09-11 MED ORDER — SUGAMMADEX SODIUM 200 MG/2ML IV SOLN
INTRAVENOUS | Status: AC
  Filled 2018-09-11: qty 2

## 2018-09-11 MED ORDER — CHLORHEXIDINE GLUCONATE CLOTH 2 % EX PADS
6.0000 | MEDICATED_PAD | Freq: Once | CUTANEOUS | Status: DC
  Filled 2018-09-11: qty 6

## 2018-09-11 MED ORDER — ROCURONIUM BROMIDE 10 MG/ML (PF) SYRINGE
PREFILLED_SYRINGE | INTRAVENOUS | Status: AC
  Filled 2018-09-11: qty 10

## 2018-09-11 MED ORDER — MIDAZOLAM HCL 2 MG/2ML IJ SOLN
INTRAMUSCULAR | Status: AC
  Filled 2018-09-11: qty 2

## 2018-09-11 MED ORDER — HYDROGEN PEROXIDE 3 % EX SOLN
CUTANEOUS | Status: DC | PRN
  Administered 2018-09-11: 1 via TOPICAL

## 2018-09-11 MED ORDER — HEPARIN SODIUM (PORCINE) 5000 UNIT/ML IJ SOLN
INTRAMUSCULAR | Status: AC
  Filled 2018-09-11: qty 1

## 2018-09-11 MED ORDER — FENTANYL CITRATE (PF) 100 MCG/2ML IJ SOLN
25.0000 ug | INTRAMUSCULAR | Status: DC | PRN
  Administered 2018-09-11: 50 ug via INTRAVENOUS
  Filled 2018-09-11: qty 1

## 2018-09-11 MED ORDER — DEXAMETHASONE SODIUM PHOSPHATE 10 MG/ML IJ SOLN
INTRAMUSCULAR | Status: AC
  Filled 2018-09-11: qty 1

## 2018-09-11 MED ORDER — OXYCODONE HCL 5 MG/5ML PO SOLN
5.0000 mg | Freq: Once | ORAL | Status: DC | PRN
  Filled 2018-09-11: qty 5

## 2018-09-11 MED ORDER — PROPOFOL 10 MG/ML IV BOLUS
INTRAVENOUS | Status: DC | PRN
  Administered 2018-09-11: 200 mg via INTRAVENOUS

## 2018-09-11 MED ORDER — BUPIVACAINE LIPOSOME 1.3 % IJ SUSP
INTRAMUSCULAR | Status: DC | PRN
  Administered 2018-09-11: 20 mL

## 2018-09-11 MED ORDER — ACETAMINOPHEN 500 MG PO TABS
1000.0000 mg | ORAL_TABLET | ORAL | Status: AC
  Administered 2018-09-11: 1000 mg via ORAL
  Filled 2018-09-11: qty 2

## 2018-09-11 MED ORDER — PROPOFOL 10 MG/ML IV BOLUS
INTRAVENOUS | Status: AC
  Filled 2018-09-11: qty 20

## 2018-09-11 MED ORDER — ONDANSETRON HCL 4 MG/2ML IJ SOLN
4.0000 mg | Freq: Once | INTRAMUSCULAR | Status: DC | PRN
  Filled 2018-09-11: qty 2

## 2018-09-11 MED ORDER — OXYCODONE HCL 5 MG PO TABS
5.0000 mg | ORAL_TABLET | Freq: Once | ORAL | Status: DC | PRN
  Filled 2018-09-11: qty 1

## 2018-09-11 MED ORDER — DEXAMETHASONE SODIUM PHOSPHATE 4 MG/ML IJ SOLN
INTRAMUSCULAR | Status: DC | PRN
  Administered 2018-09-11: 10 mg via INTRAVENOUS

## 2018-09-11 MED ORDER — LACTATED RINGERS IV SOLN
INTRAVENOUS | Status: DC
  Administered 2018-09-11 (×2): via INTRAVENOUS
  Filled 2018-09-11: qty 1000

## 2018-09-11 MED ORDER — LIDOCAINE HCL (CARDIAC) PF 100 MG/5ML IV SOSY
PREFILLED_SYRINGE | INTRAVENOUS | Status: DC | PRN
  Administered 2018-09-11: 30 mg via INTRAVENOUS

## 2018-09-11 MED ORDER — ONDANSETRON HCL 4 MG/2ML IJ SOLN
INTRAMUSCULAR | Status: DC | PRN
  Administered 2018-09-11: 4 mg via INTRAVENOUS

## 2018-09-11 MED ORDER — MIDAZOLAM HCL 5 MG/5ML IJ SOLN
INTRAMUSCULAR | Status: DC | PRN
  Administered 2018-09-11: 2 mg via INTRAVENOUS

## 2018-09-11 MED ORDER — ACETAMINOPHEN 500 MG PO TABS
ORAL_TABLET | ORAL | Status: AC
  Filled 2018-09-11: qty 2

## 2018-09-11 MED ORDER — ROCURONIUM BROMIDE 100 MG/10ML IV SOLN
INTRAVENOUS | Status: DC | PRN
  Administered 2018-09-11: 50 mg via INTRAVENOUS

## 2018-09-11 MED ORDER — FENTANYL CITRATE (PF) 100 MCG/2ML IJ SOLN
INTRAMUSCULAR | Status: AC
  Filled 2018-09-11: qty 2

## 2018-09-11 MED ORDER — GABAPENTIN 300 MG PO CAPS
ORAL_CAPSULE | ORAL | Status: AC
  Filled 2018-09-11: qty 1

## 2018-09-11 MED ORDER — BUPIVACAINE LIPOSOME 1.3 % IJ SUSP
20.0000 mL | Freq: Once | INTRAMUSCULAR | Status: DC
  Filled 2018-09-11: qty 20

## 2018-09-11 MED ORDER — GABAPENTIN 300 MG PO CAPS
300.0000 mg | ORAL_CAPSULE | ORAL | Status: AC
  Administered 2018-09-11: 300 mg via ORAL
  Filled 2018-09-11: qty 1

## 2018-09-11 MED ORDER — FENTANYL CITRATE (PF) 250 MCG/5ML IJ SOLN
INTRAMUSCULAR | Status: AC
  Filled 2018-09-11: qty 5

## 2018-09-11 MED ORDER — TRAMADOL HCL 50 MG PO TABS: 50.0000 mg | ORAL_TABLET | Freq: Four times a day (QID) | ORAL | 0 refills | Status: AC | PRN

## 2018-09-11 MED ORDER — BUPIVACAINE-EPINEPHRINE 0.25% -1:200000 IJ SOLN
INTRAMUSCULAR | Status: DC | PRN
  Administered 2018-09-11: 30 mL

## 2018-09-11 MED ORDER — FENTANYL CITRATE (PF) 100 MCG/2ML IJ SOLN
INTRAMUSCULAR | Status: DC | PRN
  Administered 2018-09-11: 150 ug via INTRAVENOUS
  Administered 2018-09-11: 100 ug via INTRAVENOUS
  Administered 2018-09-11 (×2): 50 ug via INTRAVENOUS

## 2018-09-11 MED ORDER — TRAMADOL HCL 50 MG PO TABS: 50.0000 mg | ORAL_TABLET | Freq: Four times a day (QID) | ORAL | 0 refills | Status: DC | PRN

## 2018-09-11 MED ORDER — SUGAMMADEX SODIUM 200 MG/2ML IV SOLN
INTRAVENOUS | Status: DC | PRN
  Administered 2018-09-11: 200 mg via INTRAVENOUS

## 2018-09-11 SURGICAL SUPPLY — 59 items
APL SKNCLS STERI-STRIP NONHPOA (GAUZE/BANDAGES/DRESSINGS) ×4
BENZOIN TINCTURE PRP APPL 2/3 (GAUZE/BANDAGES/DRESSINGS) ×6 IMPLANT
BLADE EXTENDED COATED 6.5IN (ELECTRODE) IMPLANT
BLADE HEX COATED 2.75 (ELECTRODE) ×3 IMPLANT
BLADE SURG 10 STRL SS (BLADE) IMPLANT
BLADE SURG 15 STRL LF DISP TIS (BLADE) IMPLANT
BLADE SURG 15 STRL SS (BLADE)
BRIEF STRETCH FOR OB PAD LRG (UNDERPADS AND DIAPERS) ×6 IMPLANT
CANISTER SUCT 3000ML PPV (MISCELLANEOUS) ×3 IMPLANT
COVER BACK TABLE 60X90IN (DRAPES) ×3 IMPLANT
COVER MAYO STAND STRL (DRAPES) ×3 IMPLANT
DECANTER SPIKE VIAL GLASS SM (MISCELLANEOUS) ×3 IMPLANT
DRAPE LAPAROTOMY 100X72 PEDS (DRAPES) ×3 IMPLANT
DRAPE UTILITY XL STRL (DRAPES) ×3 IMPLANT
GAUZE SPONGE 4X4 12PLY STRL (GAUZE/BANDAGES/DRESSINGS) ×3 IMPLANT
GLOVE BIO SURGEON STRL SZ7.5 (GLOVE) ×3 IMPLANT
GLOVE BIOGEL PI IND STRL 7.5 (GLOVE) ×1 IMPLANT
GLOVE BIOGEL PI IND STRL 8.5 (GLOVE) ×2 IMPLANT
GLOVE BIOGEL PI INDICATOR 7.5 (GLOVE) ×1
GLOVE BIOGEL PI INDICATOR 8.5 (GLOVE) ×2
GLOVE INDICATOR 8.0 STRL GRN (GLOVE) ×3 IMPLANT
GLOVE INDICATOR 8.5 STRL (GLOVE) ×4 IMPLANT
GOWN STRL REUS W/TWL LRG LVL3 (GOWN DISPOSABLE) ×7 IMPLANT
HYDROGEN PEROXIDE 16OZ (MISCELLANEOUS) ×3 IMPLANT
IV CATH 14GX2 1/4 (CATHETERS) IMPLANT
IV CATH 18G SAFETY (IV SOLUTION) IMPLANT
KIT TURNOVER CYSTO (KITS) ×3 IMPLANT
LOOP VESSEL MAXI BLUE (MISCELLANEOUS) ×2 IMPLANT
NDL SAFETY ECLIPSE 18X1.5 (NEEDLE) IMPLANT
NEEDLE HYPO 18GX1.5 SHARP (NEEDLE)
NEEDLE HYPO 22GX1.5 SAFETY (NEEDLE) ×3 IMPLANT
NS IRRIG 500ML POUR BTL (IV SOLUTION) ×3 IMPLANT
PACK BASIN DAY SURGERY FS (CUSTOM PROCEDURE TRAY) ×3 IMPLANT
PAD ABD 8X10 STRL (GAUZE/BANDAGES/DRESSINGS) ×3 IMPLANT
PAD ARMBOARD 7.5X6 YLW CONV (MISCELLANEOUS) IMPLANT
PENCIL BUTTON HOLSTER BLD 10FT (ELECTRODE) ×3 IMPLANT
SPONGE HEMORRHOID 8X3CM (HEMOSTASIS) IMPLANT
SPONGE SURGIFOAM ABS GEL 12-7 (HEMOSTASIS) IMPLANT
SUCTION FRAZIER HANDLE 10FR (MISCELLANEOUS)
SUCTION TUBE FRAZIER 10FR DISP (MISCELLANEOUS) IMPLANT
SUT CHROMIC 2 0 SH (SUTURE) IMPLANT
SUT CHROMIC 3 0 SH 27 (SUTURE) IMPLANT
SUT ETHIBOND 0 (SUTURE) IMPLANT
SUT MNCRL AB 4-0 PS2 18 (SUTURE) IMPLANT
SUT SILK 0 TIES 10X30 (SUTURE) ×2 IMPLANT
SUT SILK 2 0 (SUTURE)
SUT SILK 2-0 18XBRD TIE 12 (SUTURE) IMPLANT
SUT VIC AB 2-0 SH 27 (SUTURE)
SUT VIC AB 2-0 SH 27XBRD (SUTURE) IMPLANT
SUT VIC AB 3-0 SH 18 (SUTURE) IMPLANT
SUT VIC AB 3-0 SH 27 (SUTURE) ×3
SUT VIC AB 3-0 SH 27X BRD (SUTURE) ×1 IMPLANT
SUT VIC AB 4-0 P-3 18XBRD (SUTURE) IMPLANT
SUT VIC AB 4-0 P3 18 (SUTURE)
SYR CONTROL 10ML LL (SYRINGE) ×3 IMPLANT
TOWEL OR 17X24 6PK STRL BLUE (TOWEL DISPOSABLE) ×3 IMPLANT
TRAY DSU PREP LF (CUSTOM PROCEDURE TRAY) ×3 IMPLANT
TUBE CONNECTING 12X1/4 (SUCTIONS) ×3 IMPLANT
YANKAUER SUCT BULB TIP NO VENT (SUCTIONS) ×3 IMPLANT

## 2018-09-11 NOTE — Transfer of Care (Signed)
Immediate Anesthesia Transfer of Care Note  Patient: Martin Rose  Procedure(s) Performed: ANORECTAL EXAM UNDER ANESTHESIA (N/A Rectum) FISTULOTOMY (N/A Rectum) PLACEMENT OF SETON (N/A Rectum)  Patient Location: PACU  Anesthesia Type:General  Level of Consciousness: awake, alert , oriented and patient cooperative  Airway & Oxygen Therapy: Patient Spontanous Breathing and Patient connected to face mask oxygen  Post-op Assessment: Report given to RN, Post -op Vital signs reviewed and stable and Patient moving all extremities X 4  Post vital signs: Reviewed and stable  Last Vitals:  Vitals Value Taken Time  BP 132/121 09/11/2018 10:13 AM  Temp    Pulse 73 09/11/2018 10:15 AM  Resp 15 09/11/2018 10:15 AM  SpO2 100 % 09/11/2018 10:15 AM  Vitals shown include unvalidated device data.  Last Pain:  Vitals:   09/11/18 0819  TempSrc:   PainSc: 7       Patients Stated Pain Goal: 6 (09/11/18 0819)  Complications: No apparent anesthesia complications

## 2018-09-11 NOTE — Anesthesia Postprocedure Evaluation (Signed)
Anesthesia Post Note  Patient: Hosteen Hebb  Procedure(s) Performed: ANORECTAL EXAM UNDER ANESTHESIA (N/A Rectum) FISTULOTOMY (N/A Rectum) PLACEMENT OF SETON (N/A Rectum)     Patient location during evaluation: PACU Anesthesia Type: General Level of consciousness: awake and alert Pain management: pain level controlled Vital Signs Assessment: post-procedure vital signs reviewed and stable Respiratory status: spontaneous breathing, nonlabored ventilation, respiratory function stable and patient connected to nasal cannula oxygen Cardiovascular status: blood pressure returned to baseline and stable Postop Assessment: no apparent nausea or vomiting Anesthetic complications: no    Last Vitals:  Vitals:   09/11/18 1030 09/11/18 1045  BP: 125/80 124/76  Pulse: 72 64  Resp: 20 20  Temp:    SpO2: 98% 100%    Last Pain:  Vitals:   09/11/18 1145  TempSrc:   PainSc: 0-No pain                 Lucretia Kern

## 2018-09-11 NOTE — Anesthesia Preprocedure Evaluation (Addendum)
Anesthesia Evaluation  Patient identified by MRN, date of birth, ID band Patient awake    Reviewed: Allergy & Precautions, NPO status , Patient's Chart, lab work & pertinent test results, reviewed documented beta blocker date and time   History of Anesthesia Complications Negative for: history of anesthetic complications  Airway Mallampati: II  TM Distance: >3 FB Neck ROM: Full    Dental no notable dental hx.    Pulmonary neg pulmonary ROS, former smoker,    Pulmonary exam normal        Cardiovascular hypertension, Normal cardiovascular exam  TTE Dec 2012: Normal LV size with moderate LV hypertrophy. EF 55-60%.Moderate diastolic dysfunction. Normal RV size andsystolic function.   Neuro/Psych negative neurological ROS  negative psych ROS   GI/Hepatic negative GI ROS, Neg liver ROS,   Endo/Other  negative endocrine ROS  Renal/GU negative Renal ROS  negative genitourinary   Musculoskeletal negative musculoskeletal ROS (+)   Abdominal (+) + obese,   Peds  Hematology negative hematology ROS (+)   Anesthesia Other Findings   Reproductive/Obstetrics                            Anesthesia Physical Anesthesia Plan  ASA: II  Anesthesia Plan: General   Post-op Pain Management:    Induction: Intravenous  PONV Risk Score and Plan: 2 and Ondansetron, Dexamethasone, Treatment may vary due to age or medical condition and Midazolam  Airway Management Planned: Oral ETT  Additional Equipment: None  Intra-op Plan:   Post-operative Plan: Extubation in OR  Informed Consent: I have reviewed the patients History and Physical, chart, labs and discussed the procedure including the risks, benefits and alternatives for the proposed anesthesia with the patient or authorized representative who has indicated his/her understanding and acceptance.     Plan Discussed with:   Anesthesia Plan Comments:         Anesthesia Quick Evaluation

## 2018-09-11 NOTE — Anesthesia Procedure Notes (Signed)
Procedure Name: Intubation Date/Time: 09/11/2018 9:12 AM Performed by: Alvy Bimler, CRNA Pre-anesthesia Checklist: Patient identified, Emergency Drugs available, Suction available and Patient being monitored Patient Re-evaluated:Patient Re-evaluated prior to induction Oxygen Delivery Method: Circle system utilized Preoxygenation: Pre-oxygenation with 100% oxygen Induction Type: IV induction Ventilation: Mask ventilation without difficulty Laryngoscope Size: Mac and 3 Grade View: Grade II Tube type: Oral Tube size: 7.0 mm Number of attempts: 1 Airway Equipment and Method: Stylet Placement Confirmation: ETT inserted through vocal cords under direct vision,  positive ETCO2 and breath sounds checked- equal and bilateral Secured at: 21 (right lip) cm Tube secured with: Tape Dental Injury: Teeth and Oropharynx as per pre-operative assessment

## 2018-09-11 NOTE — Discharge Instructions (Addendum)
ANORECTAL SURGERY: POST OP INSTRUCTIONS  1. DIET: Follow a light bland diet the first 24 hours after arrival home, such as soup, liquids, crackers, etc.  Be sure to include lots of fluids daily.  Avoid fast food or heavy meals as your are more likely to get nauseated.  Eat a low fat diet the next few days after surgery.    2. Take your usually prescribed home medications unless otherwise directed.  3. PAIN CONTROL: a. It is helpful to take an over-the-counter pain medication regularly for the first few days/weeks.  Choose from the following that works best for you: i. Ibuprofen (Advil, etc) Three 200mg  tabs every 6 hours as needed. ii. Acetaminophen (Tylenol, etc) 500-650mg  every 6 hours as needed iii. NOTE: You may take both of these medications together - most patients find it most helpful when alternating between the two (i.e. Ibuprofen at 6am, tylenol at 9am, ibuprofen at 12pm ...) b. A  prescription for pain medication may have been prescribed for you at discharge.  Take your pain medication as prescribed.  i. If you are having problems/concerns with the prescription medicine, please call us for further advice.  4. Avoid getting constipated.  Between the surgery and the pain medications, it is common to experience some constipation.  Increasing fluid intake (64oz of water per day) and taking a fiber supplement (such as Metamucil, Citrucel, FiberCon) 1-2 times a day regularly will usually help prevent this problem from occurring.  Take Miralax (over the counter) 1-2x/day while taking a narcotic pain medication. If no bowel movement after 48hours, you may additionally take a laxative like a bottle of Milk of Magnesia which can be purchased over the counter. Avoid enemas if possible as these are often painful.   5. Watch out for diarrhea.  If you have many loose bowel movements, simplify your diet to bland foods.  Stop any stool softeners and decrease your fiber supplement. If this worsens or does  not improve, please call us.  6. Wash / shower every day.  If you were discharged with a dressing, you may remove this the day after your surgery. You may shower normally, getting soap/water on your wound, particularly after bowel movements. 7. You have a seton in place which is due to the amount of sphincter muscle that your fistula involved. This is a Information systems manager secured to itself with suture. It is difficult to remove without scissors which is the idea. Gentle trauma to the area will not cause it to come out (I.e. You may wipe normally without fear of pulling it out). The plan moving forward will be to keep this in place for 3 months. This allows the fistula tract to "mature" and become easier to work with at a subsequent procedure. It also decreases the risk of recurrent perianal abscesses during that time.  8. Soaking in a warm bath filled a couple inches ("Sitz bath") is a great way to clean the area after a bowel movement and many patients find it is a way to soothe the area.  9. ACTIVITIES as tolerated:   a. You may resume regular (light) daily activities beginning the next day--such as daily self-care, walking, climbing stairs--gradually increasing activities as tolerated.  If you can walk 30 minutes without difficulty, it is safe to try more intense activity such as jogging, treadmill, bicycling, low-impact aerobics, etc. b. Refrain from any heavy lifting or straining for the first 2 weeks after your procedure, particularly if your surgery was for hemorrhoids. c.  Avoid activities that make your pain worse d. You may drive when you are no longer taking prescription pain medication, you can comfortably wear a seatbelt, and you can safely maneuver your car and apply brakes.  10. FOLLOW UP in our office a. Please call [facility] at [phone] to set up an appointment to see your surgeon in the office for a follow-up appointment approximately 3-4 weeks after your surgery. b. Make sure that you  call for this appointment the day you arrive home to insure a convenient appointment time.  9. If you have disability or family leave forms that need to be completed, you may have them completed by your primary care physician's office; for return to work instructions, please ask our office staff and they will be happy to assist you in obtaining this documentation   When to call us [phone]: 1. Poor pain control 2. Reactions / problems with new medications (rash/itching, etc)  3. Fever over 101.5 F (38.5 C) 4. Inability to urinate 5. Nausea/vomiting 6. Worsening swelling or bruising 7. Continued bleeding from incision. 8. Increased pain, redness, or drainage from the incision  The clinic staff is available to answer your questions during regular business hours (8:30am-5pm).  Please dont hesitate to call and ask to speak to one of our nurses for clinical concerns.   A surgeon from [facility] is always on call at the hospitals   If you have a medical emergency, go to the nearest emergency room or call 911.   [facility] [address], [place], [place]  [address] ? MAIN: [phone] FAX [phone] [email or web address]  Information for Discharge Teaching: EXPAREL (bupivacaine liposome injectable suspension)   Your surgeon gave you EXPAREL(bupivacaine) in your surgical incision to help control your pain after surgery.   EXPAREL is a local anesthetic that provides pain relief by numbing the tissue around the surgical site.  EXPAREL is designed to release pain medication over time and can control pain for up to 72 hours.  Depending on how you respond to EXPAREL, you may require less pain medication during your recovery.  Possible side effects:  Temporary loss of sensation or ability to move in the area where bupivacaine was injected.  Nausea, vomiting, constipation  Rarely, numbness and tingling in your mouth or  lips, lightheadedness, or anxiety may occur.  Call your doctor right away if you think you may be experiencing any of these sensations, or if you have other questions regarding possible side effects.  Follow all other discharge instructions given to you by your surgeon or nurse. Eat a healthy diet and drink plenty of water or other fluids.  If you return to the hospital for any reason within 96 hours following the administration of EXPAREL, please inform your health care providers.  **Do not remove green bracelet for 96 hours(4 days-Friday)

## 2018-09-11 NOTE — Op Note (Signed)
09/11/2018  10:02 AM  PATIENT:  Martin Rose  42 y.o. male  Patient Care Team: Marthenia Rolling, DO as PCP - General  PRE-OPERATIVE DIAGNOSIS:  Recurrent perianal abscesses  POST-OPERATIVE DIAGNOSIS:  Low trans-sphincteric anal fistula  PROCEDURE:   1. Anorectal examination under anesthesia 2. Placement of seton  SURGEON:  Surgeon(s): Andria Meuse, MD  ASSISTANT: none   ANESTHESIA:   local  SPECIMEN:  No Specimen  DISPOSITION OF SPECIMEN:  N/A  COUNTS: Sponge, needle, and instrument counts were reported correct x2 at conclusion of the procedure  EBL: 3cc  COMPLICATIONS: None  PLAN OF CARE: Discharge to home after PACU  PATIENT DISPOSITION:  PACU - hemodynamically stable.  INDICATION:  Martin Rose is a very pleasant 42yoM with hx of HTN who underwent incision and drainage of a perianal abscess on 03/22/18 by Dr. Lindie Spruce. He was lost to follow-up after this but return to the office in early July for evaluation of a draining wound of the perianal area. He was seen in the office and offered EUA/possible fistulotomy/possible seton; he was in the process of scheduling when an abscess recurred  He presented to [facility] in the middle of August with a recurrent perianal abscess. He was taken the OR by Dr. Lindie Spruce for incision and drainage and was found to have a large perianal abscess in the left perianal area. He recovered well from this and was discharged home. He was seen back for follow-up. He had been doing wound care by placing a gauze in the gluteal crease. He denies any issues with worsening pain and states it slowly over the is improving. He denies fever/chills. The drainage that he had has been decreasing daily.  He denies any history of incontinence to solid stool, liquid stool, nor gas.  On exam, his wound is healing appropriately, with granulation tissue present.  Options for management were discussed going forward.  Given that he has had  recurrent perianal abscesses in the exact same location, the probability he has a fistula is high.  We discussed further observation with recurrent abscesses be in the risk versus undergoing a surgical procedure for further evaluation and possible treatment of the fistula.  He opted to pursue surgery.  Please refer to H&P for details regarding this discussion.  OR FINDINGS: Deep anal canal in part due to habitus.  Scar in the left anterior lateral position consistent with healing/healed peri-anal abscess drainage procedure.  This scar went down to the level of the external sphincter.  The fistulous tract was injected with a dilute mixture of hydrogen peroxide, approximately 2 cc.  Circumferential anoscopy was then completed and demonstrated bubbles in a straight line extending from the injection site to an area just distal to the dentate line.  A fistula probe was then passed carefully, so as to minimize the risk of false passage.  The internal opening was successfully cannulated.  The tract and overlying muscle were then palpated.  This appeared to arise at/just outside the intersphincteric groove, making this a probable low transsphincteric fistula.  Given his scarring that extended down to his external sphincter muscle and inability to completely confirm the integrity of this muscle in its entirety, I elected to place a seton for drainage purposes and what will likely end up being a subsequent LIFT procedure.  DESCRIPTION: The patient was identified in the preoperative holding area and taken to the OR where they were placed on the operating room table. SCDs were placed.  General endotracheal anesthesia  was induced without difficulty. The patient was then positioned in prone jackknife position with buttocks gently taped apart after being sprayed with benzoin.  Pressure points were padded and then verified.  The patient was then prepped and draped in usual sterile fashion.  A surgical timeout was performed  indicating the correct patient, procedure, positioning.  On exam, he did have a fairly deep anal canal.  There was scarring in the left anterolateral position consistent with his recent incision and drainage procedure in the scarring extended down to the level of the external sphincter muscle.  There appeared to be an external opening consistent with a fistula at the proximal most aspect of this just on top of the external sphincter muscle.  Beginning with a perirectal block, a dilute mixture of 0.25% Marcaine with epinephrine and Exparel were infiltrated.  A digital rectal exam was then performed.  The anal canal was smooth and there were no palpable abnormalities.  Circumferential anoscopy was then performed and demonstrated no visual abnormalities to the anal canal.  The Candida external opening was then cannulated with a Angiocath and approximately 2 cc of dilute hydroperoxide was injected.  Bubbles were seen extending and aligned straight from the external opening to an area just distal to the dentate line.  A fistula probe was then gently passed and the internal opening successfully cannulized.  The overlying muscle was then palpated and clearly involved a portion of the internal sphincter muscle and potentially a portion of the external sphincter muscle but this was difficult to delineate given the scarring in this area.  Given these findings, there was a palpable tract extending across the intersphincteric groove and is likely he has a low trans-enteric fistula.  A blue vessel loop seton was therefore placed and secured to itself with three 0 silk sutures.  The anal canal was inspected and hemostasis noted.  Additional local anesthetic was infiltrated into the area around where we were working.  4 x 4 gauze were then used to cover the surgical site and secured with mesh underwear.  The patient was then transferred to a stretcher, awakened from anesthesia, extubated, and transferred to PACU in  satisfactory condition  DISPOSITION:PACU in satisfactory condition.

## 2018-09-11 NOTE — H&P (Signed)
CC: Possible perianal fistula - postop f/u now  HPI: [name] is a very pleasant 42yoM with hx of HTN who underwent incision and drainage of a perianal abscess on 03/22/18 by Dr. Lindie Spruce. He was lost to follow-up after this but return to the office in early July for evaluation of a draining wound of the perianal area. He was seen in the office and offered EUA/possible fistulotomy/possible seton; he was in the process of scheduling when an abscess recurred  He presented to [facility] in the middle of August with a recurrent perianal abscess. He was taken the OR by Dr. Lindie Spruce for incision and drainage and was found to have a large perianal abscess in the left perianal area. He recovered well from this and was discharged home. He was seen back for follow-up. He had been doing wound care by placing a gauze in the gluteal crease. He denies any issues with worsening pain and states it slowly over the is improving. He denies fever/chills. The drainage that he had has been decreasing daily.  He denies any history of incontinence to solid stool, liquid stool, nor gas.  He has never had a colonoscopy  He denies any personal or family history of colorectal cancer, IBD/Crohn's/ulcerative colitis  PMH: Hypertension (well controlled on oral antihypertensive)  PSH: As stated above  FHx: Denies FHx of malignancy  Social: Denies use of tobacco/EtOH/drugs  ROS: A comprehensive 10 system review of systems was completed with the patient and pertinent findings as noted above.   Past Medical History:  Diagnosis Date  . History of rectal abscess   . Hypertension   . Palpitations 07/11/2017   per pt only feels when he over exerts himself without sob  . Perirectal fistula    wound/  left anterior    Past Surgical History:  Procedure Laterality Date  . CARDIAC CATHETERIZATION  07-20-2006   dr Jenne Campus   abnormal cardiolite--  no sig. cad, normal LVF, ef 60%, systemic HTN, elevated LVEDP  (22)  . INCISION AND DRAINAGE PERIRECTAL ABSCESS Left 03/22/2018   Procedure: IRRIGATION AND DEBRIDEMENT PERIRECTAL ABSCESS;  Surgeon: Jimmye Norman, MD;  Location: [facility] OR;  Service: General;  Laterality: Left;  . INCISION AND DRAINAGE PERIRECTAL ABSCESS N/A 07/31/2018   Procedure: IRRIGATION AND DEBRIDEMENT PERIRECTAL ABSCESS;  Surgeon: Jimmye Norman, MD;  Location: [facility] OR;  Service: General;  Laterality: N/A;  . TRANSTHORACIC ECHOCARDIOGRAM  11/29/2011   moderate LVH, ef 55-60%,  grade 2 diastolic dysfunction/  trivial MR and TR/  mild LAE    Family History  Problem Relation Age of Onset  . Hypertension Mother   . Hypertension Father   . Cancer Sister 30       brain tumor  . Stroke Sister     Social:  reports that he quit smoking about 9 years ago. His smoking use included cigarettes. He has a 7.50 pack-year smoking history. He has never used smokeless tobacco. He reports that he drinks about 24.0 standard drinks of alcohol per week. He reports that he has current or past drug history. Drug: Marijuana.  Allergies: No Known Allergies  Medications: I have reviewed the patient's current medications.  Results for orders placed or performed during the hospital encounter of 09/11/18 (from the past 48 hour(s))  I-STAT 4, (NA,K, GLUC, HGB,HCT)     Status: Abnormal   Collection Time: 09/11/18  8:37 AM  Result Value Ref Range   Sodium 142 135 - 145 mmol/L   Potassium 4.4  3.5 - 5.1 mmol/L   Glucose, Bld 112 (H) 70 - 99 mg/dL   HCT 16.1 09.6 - 04.5 %   Hemoglobin 13.9 13.0 - 17.0 g/dL    No results found.  ROS - all of the below systems have been reviewed with the patient and positives are indicated with bold text General: chills, fever or night sweats Eyes: blurry vision or double vision ENT: epistaxis or sore throat Allergy/Immunology: itchy/watery eyes or nasal congestion Hematologic/Lymphatic: bleeding problems, blood clots or swollen lymph nodes Endocrine: temperature intolerance or  unexpected weight changes Breast: new or changing breast lumps or nipple discharge Resp: cough, shortness of breath, or wheezing CV: chest pain or dyspnea on exertion GI: as per HPI GU: dysuria, trouble voiding, or hematuria MSK: joint pain or joint stiffness Neuro: TIA or stroke symptoms Derm: pruritus and skin lesion changes Psych: anxiety and depression  PE Blood pressure (!) 135/99, pulse (!) 59, temperature 98.7 F (37.1 C), temperature source Oral, resp. rate 18, height 6' (1.829 m), weight 130.1 kg, SpO2 99 %. Constitutional: NAD; conversant; no deformities Eyes: Moist conjunctiva; no lid lag; anicteric; PERRL Neck: Trachea midline; no thyromegaly Lungs: Normal respiratory effort; no tactile fremitus CV: RRR; no palpable thrills; no pitting edema GI: Abd soft, NT/ND; no palpable hepatosplenomegaly MSK: Normal gait; no clubbing/cyanosis Psychiatric: Appropriate affect; alert and oriented x3 Lymphatic: No palpable cervical or axillary lymphadenopathy  Results for orders placed or performed during the hospital encounter of 09/11/18 (from the past 48 hour(s))  I-STAT 4, (NA,K, GLUC, HGB,HCT)     Status: Abnormal   Collection Time: 09/11/18  8:37 AM  Result Value Ref Range   Sodium 142 135 - 145 mmol/L   Potassium 4.4 3.5 - 5.1 mmol/L   Glucose, Bld 112 (H) 70 - 99 mg/dL   HCT 40.9 81.1 - 91.4 %   Hemoglobin 13.9 13.0 - 17.0 g/dL   A/P: Martin Rose is an 42 y.o. male with recurrent perianal abscesses and probable associated perianal fistula  -I discussed options moving forward including observation versus surgery. The risks of observation include persistent fistula and recurrent abscess. We discussed the surgical options including anorectal exam under anesthesia with possible seton versus fistulotomy based on intraoperative findings. He opted to pursue surgery -The planned procedure, material risks (including, but not limited to, pain, bleeding, infection, scarring, need  for blood transfusion, damage to anal sphincter, incontinence of gas and/or stool, need for additional procedures, recurrence, pneumonia, heart attack, stroke, death) benefits and alternatives to surgery were discussed at length. I noted a good probability that the procedure would help improve their symptoms. The patient's questions were answered to his satisfaction, he voiced understanding and elected to proceed with surgery. Additionally, we discussed typical postoperative expectations and the recovery process. We discussed the potential need for multiple procedures to address his fistula as well.  Stephanie Coup. Cliffton Asters, M.D. General and Colorectal Surgery [facility]

## 2018-09-12 ENCOUNTER — Encounter (HOSPITAL_BASED_OUTPATIENT_CLINIC_OR_DEPARTMENT_OTHER): Payer: Self-pay | Admitting: Surgery

## 2018-09-19 ENCOUNTER — Other Ambulatory Visit: Payer: Self-pay

## 2018-09-19 ENCOUNTER — Encounter (HOSPITAL_COMMUNITY): Payer: Self-pay | Admitting: Emergency Medicine

## 2018-09-19 ENCOUNTER — Emergency Department (HOSPITAL_COMMUNITY): Payer: BLUE CROSS/BLUE SHIELD

## 2018-09-19 ENCOUNTER — Emergency Department (HOSPITAL_COMMUNITY)
Admission: EM | Admit: 2018-09-19 | Discharge: 2018-09-19 | Disposition: A | Discharge status: Home / Self Care | Payer: BLUE CROSS/BLUE SHIELD | Attending: Emergency Medicine | Admitting: Emergency Medicine

## 2018-09-19 DIAGNOSIS — I10 Essential (primary) hypertension: Secondary | ICD-10-CM | POA: Diagnosis not present

## 2018-09-19 DIAGNOSIS — Z79899 Other long term (current) drug therapy: Secondary | ICD-10-CM | POA: Insufficient documentation

## 2018-09-19 DIAGNOSIS — L03315 Cellulitis of perineum: Secondary | ICD-10-CM | POA: Insufficient documentation

## 2018-09-19 DIAGNOSIS — K610 Anal abscess: Secondary | ICD-10-CM

## 2018-09-19 LAB — CBC WITH DIFFERENTIAL/PLATELET
Abs Immature Granulocytes: 0.02 10*3/uL (ref 0.00–0.07)
BASOS ABS: 0.1 10*3/uL (ref 0.0–0.1)
Basophils Relative: 1 %
Eosinophils Absolute: 0.4 10*3/uL (ref 0.0–0.5)
Eosinophils Relative: 5 %
HCT: 41.4 % (ref 39.0–52.0)
Hemoglobin: 13.2 g/dL (ref 13.0–17.0)
IMMATURE GRANULOCYTES: 0 %
LYMPHS PCT: 34 %
Lymphs Abs: 2.6 10*3/uL (ref 0.7–4.0)
MCH: 26.6 pg (ref 26.0–34.0)
MCHC: 31.9 g/dL (ref 30.0–36.0)
MCV: 83.3 fL (ref 80.0–100.0)
MONOS PCT: 9 %
Monocytes Absolute: 0.6 10*3/uL (ref 0.1–1.0)
NEUTROS ABS: 3.8 10*3/uL (ref 1.7–7.7)
Neutrophils Relative %: 51 %
PLATELETS: 266 10*3/uL (ref 150–400)
RBC: 4.97 MIL/uL (ref 4.22–5.81)
RDW: 13.6 % (ref 11.5–15.5)
WBC: 7.5 10*3/uL (ref 4.0–10.5)
nRBC: 0 % (ref 0.0–0.2)

## 2018-09-19 LAB — BASIC METABOLIC PANEL
ANION GAP: 10 (ref 5–15)
BUN: 13 mg/dL (ref 6–20)
CALCIUM: 9.3 mg/dL (ref 8.9–10.3)
CO2: 22 mmol/L (ref 22–32)
Chloride: 106 mmol/L (ref 98–111)
Creatinine, Ser: 1.13 mg/dL (ref 0.61–1.24)
GFR calc non Af Amer: >60 mL/min (ref >60)
Glucose, Bld: 107 mg/dL — ABNORMAL HIGH (ref 70–99)
Potassium: 4.3 mmol/L (ref 3.5–5.1)
SODIUM: 138 mmol/L (ref 135–145)

## 2018-09-19 MED ORDER — HYDROCODONE-ACETAMINOPHEN 5-325 MG PO TABS: 2.0000 | ORAL_TABLET | Freq: Four times a day (QID) | ORAL | 0 refills | Status: AC | PRN

## 2018-09-19 MED ORDER — FENTANYL CITRATE (PF) 100 MCG/2ML IJ SOLN
100.0000 ug | Freq: Once | INTRAMUSCULAR | Status: AC
  Administered 2018-09-19: 100 ug via INTRAVENOUS
  Filled 2018-09-19: qty 2

## 2018-09-19 MED ORDER — LIDOCAINE (ANORECTAL) 5 % EX CREA: 1.0000 "application " | TOPICAL_CREAM | Freq: Four times a day (QID) | CUTANEOUS | 1 refills | Status: AC | PRN

## 2018-09-19 MED ORDER — POLYETHYLENE GLYCOL 3350 17 G PO PACK: 17.0000 g | PACK | Freq: Every day | ORAL | 0 refills | Status: AC

## 2018-09-19 MED ORDER — IOHEXOL 300 MG/ML  SOLN
100.0000 mL | Freq: Once | INTRAMUSCULAR | Status: AC | PRN
  Administered 2018-09-19: 100 mL via INTRAVENOUS

## 2018-09-19 MED ORDER — METRONIDAZOLE 500 MG PO TABS: 500.0000 mg | ORAL_TABLET | Freq: Three times a day (TID) | ORAL | 0 refills | Status: AC

## 2018-09-19 NOTE — ED Provider Notes (Signed)
[facility] EMERGENCY DEPARTMENT Provider Note   CSN: [id number] Arrival date & time: 09/19/18  4098     History   Chief Complaint Chief Complaint  Patient presents with  . Abscess    HPI Martin Rose is a 42 y.o. male.  HPI  The patient is a 42 year old male, he has a history of recurrent perirectal abscesses, he has had a recent procedure to help fistula drain appropriately however he is now increased in his pain, he was sent over from the office because of increasing tenderness and the inability to fully examine the patient in the office with concern of recurrent abscess.  His symptoms are persistent, gradually worsening, not associated with any blood from the rectum, he denies  fevers or chills.  He has had medications at home with inadequate pain control.    He is coming from the surgical offices, he states he feels like a stick is sticking in his bottom, he is unable to tolerate any spreading of the gluteal folds  He reports that he usually takes a sitz bath after having a bowel movement.  He has been taking his pain medication including his ibuprofen and long-term pain medicine but seems to be getting more painful.  Past Medical History:  Diagnosis Date  . History of rectal abscess   . Hypertension   . Palpitations 07/11/2017   per pt only feels when he over exerts himself without sob  . Perirectal fistula    wound/  left anterior    Patient Active Problem List   Diagnosis Date Noted  . Perianal abscess 07/30/2018  . Skin rash 05/22/2018  . Peri-rectal abscess 03/23/2018  . Rectal abscess 03/22/2018  . Perirectal abscess 03/22/2018  . Palpitations 07/11/2017  . Alcohol use 07/11/2017  . Allergic rhinitis 06/24/2015  . Morbid obesity (HCC) 04/30/2013  . Snoring 04/30/2013  . Hidradenitis 09/04/2012  . Essential hypertension, benign 03/10/2009    Past Surgical History:  Procedure Laterality Date  . CARDIAC CATHETERIZATION  07-20-2006    dr Jenne Campus   abnormal cardiolite--  no sig. cad, normal LVF, ef 60%, systemic HTN, elevated LVEDP (22)  . FISTULOTOMY N/A 09/11/2018   Procedure: FISTULOTOMY;  Surgeon: Andria Meuse, MD;  Location: [facility];  Service: General;  Laterality: N/A;  . INCISION AND DRAINAGE PERIRECTAL ABSCESS Left 03/22/2018   Procedure: IRRIGATION AND DEBRIDEMENT PERIRECTAL ABSCESS;  Surgeon: Jimmye Norman, MD;  Location: [facility] OR;  Service: General;  Laterality: Left;  . INCISION AND DRAINAGE PERIRECTAL ABSCESS N/A 07/31/2018   Procedure: IRRIGATION AND DEBRIDEMENT PERIRECTAL ABSCESS;  Surgeon: Jimmye Norman, MD;  Location: [facility] OR;  Service: General;  Laterality: N/A;  . PLACEMENT OF SETON N/A 09/11/2018   Procedure: PLACEMENT OF SETON;  Surgeon: Andria Meuse, MD;  Location: [facility];  Service: General;  Laterality: N/A;  . TRANSTHORACIC ECHOCARDIOGRAM  11/29/2011   moderate LVH, ef 55-60%,  grade 2 diastolic dysfunction/  trivial MR and TR/  mild LAE        Home Medications    Prior to Admission medications   Medication Sig Start Date End Date Taking? Authorizing Provider  amLODipine (NORVASC) 10 MG tablet Take 1 tablet (10 mg total) by mouth daily. Patient taking differently: Take 10 mg by mouth every morning.  09/06/17  Yes Bland, Scott, DO  atenolol (TENORMIN) 50 MG tablet Take 1 tablet (50 mg total) by mouth daily. Patient taking differently: Take 50 mg by mouth every morning.  09/06/17  Yes Bland, Scott, DO  ibuprofen (ADVIL,MOTRIN) 200 MG tablet Take 1-4 tablets (200-800 mg total) by mouth every 6 (six) hours as needed (for pain). Patient taking differently: Take 200-800 mg by mouth every 6 (six) hours as needed for moderate pain.  08/02/18  Yes Barnetta Chapel, PA-C  HYDROcodone-acetaminophen (NORCO) 5-325 MG tablet Take 2 tablets by mouth every 6 (six) hours as needed for up to 7 days (postoperative pain not controlled with ibuprofen). 09/19/18 09/26/18  Andria Meuse, MD  Lidocaine, Anorectal, (RECTICARE) 5 % CREA Apply 1 application topically every 6 (six) hours as needed for up to 14 days (anal pain - apply to area where seton is). 09/19/18 10/03/18  Andria Meuse, MD  metroNIDAZOLE (FLAGYL) 500 MG tablet Take 1 tablet (500 mg total) by mouth 3 (three) times daily for 10 days. 09/19/18 09/29/18  Andria Meuse, MD    Family History Family History  Problem Relation Age of Onset  . Hypertension Mother   . Hypertension Father   . Cancer Sister 30       brain tumor  . Stroke Sister     Social History Social History   Tobacco Use  . Smoking status: Former Smoker    Packs/day: 0.50    Years: 15.00    Pack years: 7.50    Types: Cigarettes    Last attempt to quit: 2010    Years since quitting: 9.7  . Smokeless tobacco: Never Used  Substance Use Topics  . Alcohol use: Yes    Alcohol/week: 24.0 standard drinks    Types: 24 Cans of beer per week    Comment: 4 beer daily  . Drug use: Yes    Types: Marijuana    Comment: 03/22/2018 "2-3 times/wk"     Allergies   Patient has no known allergies.   Review of Systems Review of Systems  All other systems reviewed and are negative.    Physical Exam Updated Vital Signs BP (!) 135/93   Pulse (!) 55   Temp 98.8 F (37.1 C) (Oral)   Resp 18   SpO2 100%   Physical Exam  Constitutional: He appears well-developed and well-nourished. No distress.  HENT:  Head: Normocephalic and atraumatic.  Mouth/Throat: Oropharynx is clear and moist. No oropharyngeal exudate.  Eyes: Pupils are equal, round, and reactive to light. Conjunctivae and EOM are normal. Right eye exhibits no discharge. Left eye exhibits no discharge. No scleral icterus.  Neck: Normal range of motion. Neck supple. No JVD present. No thyromegaly present.  Cardiovascular: Normal rate, regular rhythm, normal heart sounds and intact distal pulses. Exam reveals no gallop and no friction rub.  No murmur  heard. Pulmonary/Chest: Effort normal and breath sounds normal. No respiratory distress. He has no wheezes. He has no rales.  Abdominal: Soft. Bowel sounds are normal. He exhibits no distension and no mass. There is no tenderness.  Genitourinary:  Genitourinary Comments: Normal-appearing penis scrotum and testicles, he does have what appears to be tenderness in the bilateral gluteal folds, I am unable to examine his anal region as the patient has significant tenderness in that area.  I do not see any blood or purulent material around the site.  Musculoskeletal: Normal range of motion. He exhibits no edema or tenderness.  Lymphadenopathy:    He has no cervical adenopathy.  Neurological: He is alert. Coordination normal.  Skin: Skin is warm and dry. No rash noted. No erythema.  Psychiatric: He has a normal mood and  affect. His behavior is normal.  Nursing note and vitals reviewed.    ED Treatments / Results  Labs (all labs ordered are listed, but only abnormal results are displayed) Labs Reviewed  BASIC METABOLIC PANEL - Abnormal; Notable for the following components:      Result Value   Glucose, Bld 107 (*)    All other components within normal limits  CBC WITH DIFFERENTIAL/PLATELET    EKG None  Radiology Ct Pelvis W Contrast  Result Date: 09/19/2018 CLINICAL DATA:  43 year old male with history of recurrent perianal abscess, recent I and D and low trans-sphincteric fistula with seton placement, now with new LEFT perianal pain for 2 days. EXAM: CT PELVIS WITH CONTRAST TECHNIQUE: Multidetector CT imaging of the pelvis was performed using the standard protocol following the bolus administration of intravenous contrast. CONTRAST:  OMNIPAQUE IOHEXOL 300 MG/ML  SOLN COMPARISON:  07/30/2018 FINDINGS: Urinary Tract:  No abnormality visualized. Bowel: Linear high density structure in the LEFT perianal region is compatible with history of seton placement for fistula. No discrete perianal  abscess noted. Mild soft tissue density in the MEDIAL LEFT gluteal region along the intergluteal cleft likely represents changes from prior infection/inflammation. No other bowel abnormalities identified. Vascular/Lymphatic: No pathologically enlarged lymph nodes. No significant vascular abnormality seen. Reproductive:  No mass or other significant abnormality Other:  None. Musculoskeletal: No suspicious bone lesions identified. IMPRESSION: 1. No CT evidence of recurrent perianal/rectal abscess. Linear high density LEFT perianal structure compatible with seton placement. Mild soft tissue density in the MEDIAL LEFT gluteal region likely represents changes from prior infection/inflammation. 2. No acute abnormalities identified. Electronically Signed   By: Harmon Pier M.D.   On: 09/19/2018 13:19    Procedures Procedures (including critical care time)  Medications Ordered in ED Medications  fentaNYL (SUBLIMAZE) injection 100 mcg (100 mcg Intravenous Given 09/19/18 1130)  iohexol (OMNIPAQUE) 300 MG/ML solution 100 mL (100 mLs Intravenous Contrast Given 09/19/18 1253)     Initial Impression / Assessment and Plan / ED Course  I have reviewed the triage vital signs and the nursing notes.  Pertinent labs & imaging results that were available during my care of the patient were reviewed by me and considered in my medical decision making (see chart for details).     The patient will need to have further evaluation with CT scan as per the surgical recommendations.  We will let the surgical team know exactly was going on so that they can follow along.  At this time I do not think the patient needs any pain medicine and he is declining that as he has pain medicine in his system.  He does not have a fever, he is not tachycardic, he has a nonsurgical abdomen.  Has been seen by general surgery, they have recommended discharge, they have prepared his discharge instructions, stable for discharge but no signs of  abscess.  Final Clinical Impressions(s) / ED Diagnoses   Final diagnoses:  Anal cellulitis    ED Discharge Orders         Ordered    HYDROcodone-acetaminophen (NORCO) 5-325 MG tablet  Every 6 hours PRN     09/19/18 1301    metroNIDAZOLE (FLAGYL) 500 MG tablet  3 times daily     09/19/18 1301    Lidocaine, Anorectal, (RECTICARE) 5 % CREA  Every 6 hours PRN     09/19/18 1301           Eber Hong, MD 09/19/18 1328

## 2018-09-19 NOTE — Progress Notes (Signed)
Labs and CT scan reviewed. I discussed everything with the patient this afternoon as well as our plan. Normal WBC and no evident fluid collections on CT. Martin Rose is low profile but moves freely   PLAN -Perianal pain - rx for hydrocodone and flagyl provided; additionally have written for recticare 5% topical -I recommended he continue taking fiber supplementation; while taking any narcotics, needs to take once daily miralax -He has follow-up arranged with [place] next week  Stephanie Coup. Cliffton Asters, M.D. [facility]

## 2018-09-19 NOTE — ED Triage Notes (Signed)
Pt reports he just left Wyatt's office to get a scan to look at his rectum. He has been having discomfort and wanted to make sure no infection or another boil returning. He spoke to Dr. Hyacinth Meeker.

## 2018-09-19 NOTE — H&P (Signed)
CC: Possible perianal fistula - postop f/u now  HPI: Martin Rose is a very pleasant 42yoM with hx of HTN who underwent incision and drainage of a perianal abscess on 03/22/18 by Dr. Lindie Spruce. He was lost to follow-up after this but return to the office in early July for evaluation of a draining wound of the perianal area. He was seen in the office and offered EUA/possible fistulotomy/possible seton; he was in the process of scheduling when an abscess recurred  He presented to [facility] from the middle of August with a recurrent perianal abscess. He was taken the operative by Dr. Lindie Spruce for incision and drainage and was found to have a large perianal abscess in the left perianal area. He recovered well from this and was discharged home. He was seen in the office for follow-up.   He denies any history of incontinence to solid stool, liquid stool, nor gas.  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 He recovered well from this until 2-3 days ago when he had recurrent pain deep in his gluteal cleft - L >R. He believes the pain to be different from prior abscesses but persistent. He denies f/c. He states he has had steady drainage since seton placement. He denies pus draining.  He denies any personal or family history of colorectal cancer, IBD/Crohn's/ulcerative colitis  PMH: Hypertension (well controlled on oral antihypertensive)  PSH: As stated above  FHx: Denies FHx of malignancy  Social: Denies use of tobacco/EtOH/drugs  ROS: A comprehensive 10 system review of systems was completed with the patient and pertinent findings as noted above.  The patient is a 42 year old male.   Allergies Judithann Sauger, [place]; 09/19/2018 9:12 AM) No Known Drug Allergies [03/22/2018]: Allergies Reconciled   Medication History Judithann Sauger, [place]; 09/19/2018 9:18 AM) Augmentin (875-125MG   Tablet, 1 Oral twice a day, Taken starting 06/05/2018) Active. TraMADol HCl ER (100MG  Capsule ER 24HR, Oral, Taken starting 06/05/2018) Active. TraMADol HCl (50MG  Tablet, 1 Oral every 4 to 6 hours as needed for pain, Taken starting 06/05/2018) Active. oxyCODONE HCl (5MG  Tablet, 1 (one) Oral every six hours, as needed, Taken starting 08/10/2018) Active. HYDROcodone-Acetaminophen (5-325MG  Tablet, 1 (one) Oral every six hours, as needed, Taken starting 09/13/2018) Active. (For severe pain not controlled with Ibuprofen.) AmLODIPine Besylate (10MG  Tablet, Oral) Active. Atenolol (50MG  Tablet, Oral) Active. Medications Reconciled    Review of Systems Martin Rose M. Mohmed Farver MD; 09/19/2018 9:24 AM) General Not Present- Chills and Fever. HEENT Not Present- Blurred Vision and Headache. Neck Not Present- Neck Mass and Neck Pain. Respiratory Not Present- Cough and Difficulty Breathing on Exertion. Cardiovascular Not Present- Chest Pain, Difficulty Breathing Lying Down, Leg Cramps, Palpitations, Rapid Heart Rate, Shortness of Breath and Swelling of Extremities. Gastrointestinal Not Present- Abdominal Pain, Bloating, Bloody Stool, Change in Bowel Habits, Chronic diarrhea, Constipation, Difficulty Swallowing, Excessive gas, Gets full quickly at meals, Hemorrhoids, Indigestion, Nausea, Rectal Pain and Vomiting. Musculoskeletal Not Present- Back Pain, Joint Pain, Joint Stiffness, Muscle Pain, Muscle Weakness and Swelling of Extremities. Psychiatric Not Present- Anxiety and Depression. Endocrine Not Present- Appetite Changes and Cold Intolerance. Hematology Not Present- Blood Thinners, Easy Bruising, Excessive bleeding, Gland problems, HIV and Persistent Infections.  Vitals Elease Hashimoto King RMA; 09/19/2018 9:12 AM) 09/19/2018 9:11 AM Weight: 4.5 lb Height: 286in Body Surface Area: 1.15 m Body Mass Index: 0.04 kg/m  Temp.: 97.48F  BP: 160/90 (Sitting, Left  Arm, Standard)       Physical  Exam Martin Rose M. Zymier Rodgers MD; 09/19/2018 9:26 AM) The physical exam findings are as follows: Note:Constitutional: No acute distress; conversant; no deformities Eyes: Moist conjunctiva; no lid lag; anicteric sclerae; pupils equal round and reactive to light Neck: Trachea midline; no palpable thyromegaly Lungs: Normal respiratory effort; no tactile fremitus CV: Regular rate and rhythm; no palpable thrill; no pitting edema GI: Abdomen soft, nontender, nondistended; no palpable hepatosplenomegaly Anorectal: Seton in place left lateral. No erythema to buttocks but he has a fair amount of tenderness with palpation, particularly the left. Scarring/firmness consistent with prior abscess cavity; no overt fluctuance. Unable to tolerate DRE. MSK: Normal gait; no clubbing/cyanosis Psychiatric: Appropriate affect; alert and oriented 3 Lymphatic: No palpable cervical or axillary lymphadenopathy  Assessment & Plan  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 Here today with possible recurrent abscess  -Given his symptoms, he is heading to the emergency room for further evaluation with CT to further delineate -I discussed the case with the ER provider, Dr. Hyacinth Meeker

## 2018-09-19 NOTE — Discharge Instructions (Signed)
ANORECTAL SURGERY: POST OP INSTRUCTIONS  1. DIET: as tolerated.    2. Take your usually prescribed home medications unless otherwise directed.  3. PAIN CONTROL: a. It is helpful to take an over-the-counter pain medication regularly for the first few days/weeks.  Choose from the following that works best for you: i. Ibuprofen (Advil, etc) Three 200mg  tabs every 6 hours as needed. b. A  prescription for pain medication may have been prescribed for you at discharge.  Take your pain medication as prescribed. Do not take tylenol and your pain medication in the same day as the pain medication contains tylenol in it. i. If you are having problems/concerns with the prescription medicine, please call us for further advice.\ ii. A prescription has been sent to your pharmacy for you pain medication. Additionally, a prescription was sent for Recticare (topical lidocaine cream that you may apply up to 4 times in 24hrs to the area around your [place]) iii. A prescription has also been sent for Flagyl which is an antibiotic but also has been shown to reduce perianal pain following these kinds of procedures.  4. Avoid getting constipated.  Between the surgery and the pain medications, it is common to experience some constipation.  Increasing fluid intake (64oz of water per day) and taking a fiber supplement (such as Metamucil, Citrucel, FiberCon) 1-2 times a day regularly will usually help prevent this problem from occurring.  Take Miralax (over the counter) 1-2x/day while taking a narcotic pain medication. If no bowel movement after 48hours, you may additionally take a laxative like a bottle of Milk of Magnesia which can be purchased over the counter. Avoid enemas if possible as these are often painful.   5. Watch out for diarrhea.  If you have many loose bowel movements, simplify your diet to bland foods.  Stop any stool softeners and decrease your fiber supplement. If this worsens or does not improve, please call  us.  6. Wash / shower every day.  If you were discharged with a dressing, you may remove this the day after your surgery. You may shower normally, getting soap/water on your wound, particularly after bowel movements.  7. Soaking in a warm bath filled a couple inches ("Sitz bath") is a great way to clean the area after a bowel movement and many patients find it is a way to soothe the area.  8. ACTIVITIES as tolerated:   a. You may resume regular (light) daily activities beginning the next day--such as daily self-care, walking, climbing stairs--gradually increasing activities as tolerated.  If you can walk 30 minutes without difficulty, it is safe to try more intense activity such as jogging, treadmill, bicycling, low-impact aerobics, etc. b. Refrain from any heavy lifting or straining for the first 2 weeks after your procedure, particularly if your surgery was for hemorrhoids. c. Avoid activities that make your pain worse d. You may drive when you are no longer taking prescription pain medication, you can comfortably wear a seatbelt, and you can safely maneuver your car and apply brakes.  9. FOLLOW UP in our office a. Please call [facility] at [phone] to set up an appointment to see your surgeon in the office for a follow-up appointment approximately 2 weeks after your surgery. b. Make sure that you call for this appointment the day you arrive home to insure a convenient appointment time.  9. If you have disability or family leave forms that need to be completed, you may have them completed by your primary care physician's  office; for return to work instructions, please ask our office staff and they will be happy to assist you in obtaining this documentation   When to call us [phone]: 1. Poor pain control 2. Reactions / problems with new medications (rash/itching, etc)  3. Fever over 101.5 F (38.5 C) 4. Inability to urinate 5. Nausea/vomiting 6. Worsening swelling or  bruising 7. Continued bleeding from incision. 8. Increased pain, redness, or drainage from the incision  The clinic staff is available to answer your questions during regular business hours (8:30am-5pm).  Please dont hesitate to call and ask to speak to one of our nurses for clinical concerns.   A surgeon from [facility] is always on call at the hospitals   If you have a medical emergency, go to the nearest emergency room or call 911.   [facility] [address], [address], [place], [place]  [address] ? MAIN: [phone] FAX [phone] [email or web address]

## 2018-09-19 NOTE — ED Notes (Signed)
Hooked patient up to the monitor patient is resting with call bell in reach 

## 2018-11-03 ENCOUNTER — Other Ambulatory Visit: Payer: Self-pay | Admitting: Family Medicine

## 2018-11-03 DIAGNOSIS — I10 Essential (primary) hypertension: Secondary | ICD-10-CM

## 2018-11-06 NOTE — Progress Notes (Signed)
PLEASE PLACE ORDERS IN Epic PT HAS A PREOP APPT 11-07-18 . THANK YOU!

## 2018-11-06 NOTE — Progress Notes (Signed)
ekg 09-11-18 in epic

## 2018-11-06 NOTE — Patient Instructions (Signed)
Martin Rose  11/06/2018   Your procedure is scheduled on: 11-20-18   Report [facility] Main  Entr[facility]             Report to admitting at     0530 AM    Call this number if you have problems the morning of surgery [phone]    Remember: Do not eat food or drink liquids :After Midnight.  BRUSH YOUR TEETH MORNING OF SURGERY AND RINSE YOUR MOUTH OUT, NO CHEWING GUM CANDY OR MINTS.     Take these medicines the morning of surgery with A SIP OF WATER: atenelol, amlodipine                                You may not have any metal on your body including hair pins and              piercings  Do not wear jewelry,  lotions, powders or perfumes, deodorant .              Men may shave face and neck.   Do not bring valuables to the hospital. [facility] IS NOT             RESPONSIBLE   FOR VALUABLES.  Contacts, dentures or bridgework may not be worn into surgery.      Patients discharged the day of surgery will not be allowed to drive home.  Name and phone number of your driver:  Special Instructions: N/A              Please read over the following fact sheets you were given: _____________________________________________________________________             [facility] - Preparing for Surgery Before surgery, you can play an important role.  Because skin is not sterile, your skin needs to be as free of germs as possible.  You can reduce the number of germs on your skin by washing with CHG (chlorahexidine gluconate) soap before surgery.  CHG is an antiseptic cleaner which kills germs and bonds with the skin to continue killing germs even after washing. Please DO NOT use if you have an allergy to CHG or antibacterial soaps.  If your skin becomes reddened/irritated stop using the CHG and inform your nurse when you arrive at Short Stay. Do not shave (including legs and underarms) for at least 48 hours prior to the first CHG shower.  You may shave your  face/neck. Please follow these instructions carefully:  1.  Shower with CHG Soap the night before surgery and the  morning of Surgery.  2.  If you choose to wash your hair, wash your hair first as usual with your  normal  shampoo.  3.  After you shampoo, rinse your hair and body thoroughly to remove the  shampoo.                           4.  Use CHG as you would any other liquid soap.  You can apply chg directly  to the skin and wash                       Gently with a scrungie or clean washcloth.  5.  Apply the CHG Soap to your body ONLY FROM  THE NECK DOWN.   Do not use on face/ open                           Wound or open sores. Avoid contact with eyes, ears mouth and genitals (private parts).                       Wash face,  Genitals (private parts) with your normal soap.             6.  Wash thoroughly, paying special attention to the area where your surgery  will be performed.  7.  Thoroughly rinse your body with warm water from the neck down.  8.  DO NOT shower/wash with your normal soap after using and rinsing off  the CHG Soap.                9.  Pat yourself dry with a clean towel.            10.  Wear clean pajamas.            11.  Place clean sheets on your bed the night of your first shower and do not  sleep with pets. Day of Surgery : Do not apply any lotions/deodorants the morning of surgery.  Please wear clean clothes to the hospital/surgery center.  FAILURE TO FOLLOW THESE INSTRUCTIONS MAY RESULT IN THE CANCELLATION OF YOUR SURGERY PATIENT SIGNATURE_________________________________  NURSE SIGNATURE__________________________________  ________________________________________________________________________

## 2018-11-07 ENCOUNTER — Ambulatory Visit: Payer: Self-pay | Admitting: Surgery

## 2018-11-07 ENCOUNTER — Encounter (HOSPITAL_COMMUNITY)
Admission: RE | Admit: 2018-11-07 | Discharge: 2018-11-07 | Disposition: A | Discharge status: Home / Self Care | Payer: BLUE CROSS/BLUE SHIELD | Source: Ambulatory Visit | Attending: Surgery | Admitting: Surgery

## 2018-11-07 ENCOUNTER — Other Ambulatory Visit: Payer: Self-pay

## 2018-11-07 ENCOUNTER — Encounter (HOSPITAL_COMMUNITY): Payer: Self-pay

## 2018-11-07 DIAGNOSIS — K605 Anorectal fistula: Secondary | ICD-10-CM | POA: Diagnosis not present

## 2018-11-07 DIAGNOSIS — Z01812 Encounter for preprocedural laboratory examination: Secondary | ICD-10-CM | POA: Diagnosis not present

## 2018-11-07 LAB — CBC WITH DIFFERENTIAL/PLATELET
Abs Immature Granulocytes: 0.02 10*3/uL (ref 0.00–0.07)
BASOS ABS: 0.1 10*3/uL (ref 0.0–0.1)
Basophils Relative: 1 %
EOS ABS: 0.3 10*3/uL (ref 0.0–0.5)
Eosinophils Relative: 3 %
HEMATOCRIT: 39.5 % (ref 39.0–52.0)
Hemoglobin: 12.7 g/dL — ABNORMAL LOW (ref 13.0–17.0)
IMMATURE GRANULOCYTES: 0 %
LYMPHS ABS: 3.1 10*3/uL (ref 0.7–4.0)
Lymphocytes Relative: 35 %
MCH: 27 pg (ref 26.0–34.0)
MCHC: 32.2 g/dL (ref 30.0–36.0)
MCV: 83.9 fL (ref 80.0–100.0)
Monocytes Absolute: 0.6 10*3/uL (ref 0.1–1.0)
Monocytes Relative: 7 %
NEUTROS PCT: 54 %
NRBC: 0 % (ref 0.0–0.2)
Neutro Abs: 4.8 10*3/uL (ref 1.7–7.7)
PLATELETS: 280 10*3/uL (ref 150–400)
RBC: 4.71 MIL/uL (ref 4.22–5.81)
RDW: 14.2 % (ref 11.5–15.5)
WBC: 8.8 10*3/uL (ref 4.0–10.5)

## 2018-11-07 LAB — COMPREHENSIVE METABOLIC PANEL
ALT: 30 U/L (ref 0–44)
AST: 22 U/L (ref 15–41)
Albumin: 4.1 g/dL (ref 3.5–5.0)
Alkaline Phosphatase: 63 U/L (ref 38–126)
Anion gap: 6 (ref 5–15)
BILIRUBIN TOTAL: 0.4 mg/dL (ref 0.3–1.2)
BUN: 12 mg/dL (ref 6–20)
CHLORIDE: 108 mmol/L (ref 98–111)
CO2: 27 mmol/L (ref 22–32)
Calcium: 8.8 mg/dL — ABNORMAL LOW (ref 8.9–10.3)
Creatinine, Ser: 1.05 mg/dL (ref 0.61–1.24)
GFR calc Af Amer: >60 mL/min (ref >60)
GFR calc non Af Amer: >60 mL/min (ref >60)
Glucose, Bld: 103 mg/dL — ABNORMAL HIGH (ref 70–99)
POTASSIUM: 4.3 mmol/L (ref 3.5–5.1)
Sodium: 141 mmol/L (ref 135–145)
Total Protein: 7.3 g/dL (ref 6.5–8.1)

## 2018-11-07 NOTE — Progress Notes (Signed)
[facility]. [facility] he was in pain and unable to do fleets enema am of surgery. Shareen talked to Dr. Cliffton AstersWhite and he said pt. Did not have to do fleets enema day of surgery.

## 2018-11-19 MED ORDER — BUPIVACAINE LIPOSOME 1.3 % IJ SUSP
20.0000 mL | INTRAMUSCULAR | Status: DC
  Filled 2018-11-19: qty 20

## 2018-11-19 NOTE — Anesthesia Preprocedure Evaluation (Addendum)
Anesthesia Evaluation  Patient identified by MRN, date of birth, ID band Patient awake    Reviewed: Allergy & Precautions, NPO status , Patient's Chart, lab work & pertinent test results  History of Anesthesia Complications Negative for: history of anesthetic complications  Airway Mallampati: II  TM Distance: >3 FB Neck ROM: Full    Dental  (+) Teeth Intact, Dental Advisory Given   Pulmonary former smoker,    Pulmonary exam normal breath sounds clear to auscultation       Cardiovascular hypertension, Pt. on medications and Pt. on home beta blockers Normal cardiovascular exam Rhythm:Regular Rate:Normal  TTE 2012: Normal LV size with moderate LV hypertrophy. EF 55-60%, moderate diastolic dysfunction. Normal RV size andsystolic function.   Neuro/Psych negative neurological ROS     GI/Hepatic negative GI ROS, Neg liver ROS, Perirectal fistula   Endo/Other  Obese, BMI 38  Renal/GU negative Renal ROS     Musculoskeletal negative musculoskeletal ROS (+)   Abdominal (+) + obese,   Peds  Hematology negative hematology ROS (+)   Anesthesia Other Findings Day of surgery medications reviewed with the patient.  Reproductive/Obstetrics                           Anesthesia Physical Anesthesia Plan  ASA: II  Anesthesia Plan: General   Post-op Pain Management:    Induction: Intravenous  PONV Risk Score and Plan: 2 and Treatment may vary due to age or medical condition, Ondansetron, Dexamethasone and Midazolam  Airway Management Planned: Oral ETT  Additional Equipment:   Intra-op Plan:   Post-operative Plan: Extubation in OR  Informed Consent: I have reviewed the patients History and Physical, chart, labs and discussed the procedure including the risks, benefits and alternatives for the proposed anesthesia with the patient or authorized representative who has indicated his/her understanding  and acceptance.   Dental advisory given  Plan Discussed with: CRNA  Anesthesia Plan Comments:        Anesthesia Quick Evaluation

## 2018-11-20 ENCOUNTER — Encounter (HOSPITAL_COMMUNITY): Payer: Self-pay | Admitting: *Deleted

## 2018-11-20 ENCOUNTER — Encounter (HOSPITAL_COMMUNITY)
Admission: RE | Disposition: A | Discharge status: Home / Self Care | Payer: Self-pay | Source: Other Acute Inpatient Hospital | Attending: Surgery

## 2018-11-20 ENCOUNTER — Ambulatory Visit (HOSPITAL_COMMUNITY)
Admission: RE | Admit: 2018-11-20 | Discharge: 2018-11-20 | Disposition: A | Discharge status: Home / Self Care | Payer: BLUE CROSS/BLUE SHIELD | Source: Other Acute Inpatient Hospital | Attending: Surgery | Admitting: Surgery

## 2018-11-20 ENCOUNTER — Ambulatory Visit (HOSPITAL_COMMUNITY): Payer: BLUE CROSS/BLUE SHIELD | Admitting: Anesthesiology

## 2018-11-20 DIAGNOSIS — K603 Anal fistula: Secondary | ICD-10-CM | POA: Insufficient documentation

## 2018-11-20 DIAGNOSIS — Z87891 Personal history of nicotine dependence: Secondary | ICD-10-CM | POA: Diagnosis not present

## 2018-11-20 DIAGNOSIS — I10 Essential (primary) hypertension: Secondary | ICD-10-CM | POA: Diagnosis not present

## 2018-11-20 HISTORY — PX: ANAL FISTULOTOMY: SHX6423

## 2018-11-20 HISTORY — PX: RECTAL EXAM UNDER ANESTHESIA: SHX6399

## 2018-11-20 SURGERY — EXAM UNDER ANESTHESIA, RECTUM
Anesthesia: General | Site: Rectum

## 2018-11-20 MED ORDER — GLYCOPYRROLATE PF 0.2 MG/ML IJ SOSY
PREFILLED_SYRINGE | INTRAMUSCULAR | Status: DC | PRN
  Administered 2018-11-20: .2 mg via INTRAVENOUS

## 2018-11-20 MED ORDER — SUGAMMADEX SODIUM 200 MG/2ML IV SOLN
INTRAVENOUS | Status: DC | PRN
  Administered 2018-11-20: 400 mg via INTRAVENOUS

## 2018-11-20 MED ORDER — GLYCOPYRROLATE PF 0.2 MG/ML IJ SOSY
PREFILLED_SYRINGE | INTRAMUSCULAR | Status: AC
  Filled 2018-11-20: qty 1

## 2018-11-20 MED ORDER — BUPIVACAINE LIPOSOME 1.3 % IJ SUSP
INTRAMUSCULAR | Status: DC | PRN
  Administered 2018-11-20: 20 mL

## 2018-11-20 MED ORDER — OXYCODONE HCL 5 MG PO TABS
5.0000 mg | ORAL_TABLET | Freq: Once | ORAL | Status: AC | PRN
  Administered 2018-11-20: 5 mg via ORAL

## 2018-11-20 MED ORDER — ONDANSETRON HCL 4 MG/2ML IJ SOLN
INTRAMUSCULAR | Status: DC | PRN
  Administered 2018-11-20: 4 mg via INTRAVENOUS

## 2018-11-20 MED ORDER — PROPOFOL 10 MG/ML IV BOLUS
INTRAVENOUS | Status: DC | PRN
  Administered 2018-11-20: 200 mg via INTRAVENOUS

## 2018-11-20 MED ORDER — FENTANYL CITRATE (PF) 100 MCG/2ML IJ SOLN
25.0000 ug | INTRAMUSCULAR | Status: DC | PRN
  Administered 2018-11-20 (×3): 50 ug via INTRAVENOUS

## 2018-11-20 MED ORDER — GABAPENTIN 300 MG PO CAPS
ORAL_CAPSULE | ORAL | Status: AC
  Filled 2018-11-20: qty 1

## 2018-11-20 MED ORDER — OXYCODONE HCL 5 MG/5ML PO SOLN
5.0000 mg | Freq: Once | ORAL | Status: AC | PRN
  Filled 2018-11-20: qty 5

## 2018-11-20 MED ORDER — LACTATED RINGERS IV SOLN
INTRAVENOUS | Status: DC
  Administered 2018-11-20: 07:00:00 via INTRAVENOUS

## 2018-11-20 MED ORDER — SODIUM CHLORIDE 0.9 % IV SOLN
2.0000 g | INTRAVENOUS | Status: AC
  Administered 2018-11-20: 2 g via INTRAVENOUS
  Filled 2018-11-20: qty 2

## 2018-11-20 MED ORDER — BUPIVACAINE-EPINEPHRINE (PF) 0.25% -1:200000 IJ SOLN
INTRAMUSCULAR | Status: AC
  Filled 2018-11-20: qty 30

## 2018-11-20 MED ORDER — OXYCODONE HCL 5 MG PO TABS: 10.0000 mg | ORAL_TABLET | Freq: Four times a day (QID) | ORAL | 0 refills | Status: DC | PRN

## 2018-11-20 MED ORDER — FENTANYL CITRATE (PF) 100 MCG/2ML IJ SOLN
INTRAMUSCULAR | Status: AC
  Filled 2018-11-20: qty 2

## 2018-11-20 MED ORDER — PROPOFOL 10 MG/ML IV BOLUS
INTRAVENOUS | Status: AC
  Filled 2018-11-20: qty 40

## 2018-11-20 MED ORDER — FENTANYL CITRATE (PF) 100 MCG/2ML IJ SOLN
INTRAMUSCULAR | Status: AC
  Administered 2018-11-20: 50 ug via INTRAVENOUS
  Filled 2018-11-20: qty 4

## 2018-11-20 MED ORDER — PROMETHAZINE HCL 25 MG/ML IJ SOLN: 6.2500 mg | INTRAMUSCULAR | Status: DC | PRN

## 2018-11-20 MED ORDER — DEXAMETHASONE SODIUM PHOSPHATE 4 MG/ML IJ SOLN
INTRAMUSCULAR | Status: DC | PRN
  Administered 2018-11-20: 10 mg via INTRAVENOUS

## 2018-11-20 MED ORDER — CHLORHEXIDINE GLUCONATE CLOTH 2 % EX PADS: 6.0000 | MEDICATED_PAD | Freq: Once | CUTANEOUS | Status: DC

## 2018-11-20 MED ORDER — MIDAZOLAM HCL 5 MG/5ML IJ SOLN
INTRAMUSCULAR | Status: DC | PRN
  Administered 2018-11-20: 2 mg via INTRAVENOUS

## 2018-11-20 MED ORDER — BUPIVACAINE-EPINEPHRINE (PF) 0.25% -1:200000 IJ SOLN
INTRAMUSCULAR | Status: DC | PRN
  Administered 2018-11-20: 30 mL

## 2018-11-20 MED ORDER — BACITRACIN-NEOMYCIN-POLYMYXIN 400-5-5000 EX OINT
TOPICAL_OINTMENT | CUTANEOUS | Status: AC
  Filled 2018-11-20: qty 1

## 2018-11-20 MED ORDER — LIDOCAINE 2% (20 MG/ML) 5 ML SYRINGE
INTRAMUSCULAR | Status: DC | PRN
  Administered 2018-11-20: 80 mg via INTRAVENOUS

## 2018-11-20 MED ORDER — EPHEDRINE SULFATE-NACL 50-0.9 MG/10ML-% IV SOSY
PREFILLED_SYRINGE | INTRAVENOUS | Status: DC | PRN
  Administered 2018-11-20: 5 mg via INTRAVENOUS

## 2018-11-20 MED ORDER — GABAPENTIN 300 MG PO CAPS
300.0000 mg | ORAL_CAPSULE | ORAL | Status: AC
  Administered 2018-11-20: 300 mg via ORAL

## 2018-11-20 MED ORDER — SUGAMMADEX SODIUM 200 MG/2ML IV SOLN
INTRAVENOUS | Status: AC
  Filled 2018-11-20: qty 2

## 2018-11-20 MED ORDER — EPHEDRINE 5 MG/ML INJ
INTRAVENOUS | Status: AC
  Filled 2018-11-20: qty 10

## 2018-11-20 MED ORDER — 0.9 % SODIUM CHLORIDE (POUR BTL) OPTIME
TOPICAL | Status: DC | PRN
  Administered 2018-11-20: 1000 mL

## 2018-11-20 MED ORDER — ONDANSETRON HCL 4 MG/2ML IJ SOLN
INTRAMUSCULAR | Status: AC
  Filled 2018-11-20: qty 2

## 2018-11-20 MED ORDER — OXYCODONE HCL 5 MG PO TABS
ORAL_TABLET | ORAL | Status: AC
  Filled 2018-11-20: qty 1

## 2018-11-20 MED ORDER — ACETAMINOPHEN 10 MG/ML IV SOLN: 1000.0000 mg | Freq: Once | INTRAVENOUS | Status: DC | PRN

## 2018-11-20 MED ORDER — FLEET ENEMA 7-19 GM/118ML RE ENEM
1.0000 | ENEMA | Freq: Once | RECTAL | Status: DC
  Filled 2018-11-20: qty 1

## 2018-11-20 MED ORDER — MIDAZOLAM HCL 2 MG/2ML IJ SOLN
INTRAMUSCULAR | Status: AC
  Filled 2018-11-20: qty 2

## 2018-11-20 MED ORDER — ROCURONIUM BROMIDE 10 MG/ML (PF) SYRINGE
PREFILLED_SYRINGE | INTRAVENOUS | Status: DC | PRN
  Administered 2018-11-20: 10 mg via INTRAVENOUS
  Administered 2018-11-20: 60 mg via INTRAVENOUS

## 2018-11-20 MED ORDER — FENTANYL CITRATE (PF) 100 MCG/2ML IJ SOLN
INTRAMUSCULAR | Status: DC | PRN
  Administered 2018-11-20 (×2): 50 ug via INTRAVENOUS
  Administered 2018-11-20: 100 ug via INTRAVENOUS

## 2018-11-20 MED ORDER — ROCURONIUM BROMIDE 10 MG/ML (PF) SYRINGE
PREFILLED_SYRINGE | INTRAVENOUS | Status: AC
  Filled 2018-11-20: qty 10

## 2018-11-20 MED ORDER — LIDOCAINE 2% (20 MG/ML) 5 ML SYRINGE
INTRAMUSCULAR | Status: AC
  Filled 2018-11-20: qty 5

## 2018-11-20 MED ORDER — DEXAMETHASONE SODIUM PHOSPHATE 10 MG/ML IJ SOLN
INTRAMUSCULAR | Status: AC
  Filled 2018-11-20: qty 1

## 2018-11-20 MED ORDER — ACETAMINOPHEN 500 MG PO TABS
1000.0000 mg | ORAL_TABLET | ORAL | Status: AC
  Administered 2018-11-20: 1000 mg via ORAL
  Filled 2018-11-20: qty 2

## 2018-11-20 MED ORDER — CELECOXIB 200 MG PO CAPS
200.0000 mg | ORAL_CAPSULE | ORAL | Status: AC
  Administered 2018-11-20: 200 mg via ORAL
  Filled 2018-11-20: qty 1

## 2018-11-20 SURGICAL SUPPLY — 30 items
BRIEF STRETCH FOR OB PAD LRG (UNDERPADS AND DIAPERS) ×3 IMPLANT
COVER WAND RF STERILE (DRAPES) IMPLANT
DECANTER SPIKE VIAL GLASS SM (MISCELLANEOUS) ×1 IMPLANT
DRAPE LAPAROTOMY T 98X78 PEDS (DRAPES) ×2 IMPLANT
DRSG PAD ABDOMINAL 8X10 ST (GAUZE/BANDAGES/DRESSINGS) IMPLANT
ELECT REM PT RETURN 15FT ADLT (MISCELLANEOUS) ×3 IMPLANT
GAUZE SPONGE 4X4 12PLY STRL (GAUZE/BANDAGES/DRESSINGS) ×2 IMPLANT
GLOVE BIO SURGEON STRL SZ7.5 (GLOVE) ×3 IMPLANT
GLOVE INDICATOR 8.0 STRL GRN (GLOVE) ×3 IMPLANT
GOWN STRL REUS W/TWL XL LVL3 (GOWN DISPOSABLE) ×4 IMPLANT
KIT BASIN OR (CUSTOM PROCEDURE TRAY) ×3 IMPLANT
NEEDLE HYPO 22GX1.5 SAFETY (NEEDLE) ×3 IMPLANT
PACK GENERAL/GYN (CUSTOM PROCEDURE TRAY) ×3 IMPLANT
PROTECTOR NERVE ULNAR (MISCELLANEOUS) ×2 IMPLANT
SHEARS HARMONIC 9CM CVD (BLADE) IMPLANT
SPONGE HEMORRHOID 8X3CM (HEMOSTASIS) ×2 IMPLANT
SPONGE SURGIFOAM ABS GEL 100 (HEMOSTASIS) IMPLANT
SURGILUBE 2OZ TUBE FLIPTOP (MISCELLANEOUS) ×3 IMPLANT
SUT CHROMIC 2 0 SH (SUTURE) IMPLANT
SUT CHROMIC 3 0 SH 27 (SUTURE) IMPLANT
SUT ETHIBOND 0 36 GRN (SUTURE) ×2 IMPLANT
SUT MNCRL AB 4-0 PS2 18 (SUTURE) ×5 IMPLANT
SUT SILK 2 0 SH (SUTURE) ×2 IMPLANT
SUT SILK 3 0 SH 30 (SUTURE) ×2 IMPLANT
SUT VIC AB 3-0 SH 27 (SUTURE) ×3
SUT VIC AB 3-0 SH 27X BRD (SUTURE) ×2 IMPLANT
SYR 20CC LL (SYRINGE) ×3 IMPLANT
TAPE CLOTH SURG 4X10 WHT LF (GAUZE/BANDAGES/DRESSINGS) ×2 IMPLANT
TOWEL OR 17X26 10 PK STRL BLUE (TOWEL DISPOSABLE) ×3 IMPLANT
TOWEL OR NON WOVEN STRL DISP B (DISPOSABLE) ×3 IMPLANT

## 2018-11-20 NOTE — Discharge Instructions (Signed)
ANORECTAL SURGERY: POST OP INSTRUCTIONS  Your fistula primairly involved the external anal sphincter - given this finding, a cutting seton was placed - this will likely "migrate" out in the coming weeks on its own  1. DIET: Follow a light bland diet the first 24 hours after arrival home, such as soup, liquids, crackers, etc.  Be sure to include lots of fluids daily.  Avoid fast food or heavy meals as your are more likely to get nauseated.  Eat a low fat diet the next few days after surgery.    2. Take your usually prescribed home medications unless otherwise directed.  3. PAIN CONTROL: a. It is helpful to take an over-the-counter pain medication regularly for the first few days/weeks.  Choose from the following that works best for you: i. Ibuprofen (Advil, etc) Three 200mg  tabs every 6 hours as needed. ii. Acetaminophen (Tylenol, etc) 500-650mg  every 6 hours as needed iii. NOTE: You may take both of these medications together - most patients find it most helpful when alternating between the two (i.e. Ibuprofen at 6am, tylenol at 9am, ibuprofen at 12pm ...) b. A  prescription for pain medication may have been prescribed for you at discharge.  Take your pain medication as prescribed.  i. If you are having problems/concerns with the prescription medicine, please call us for further advice.  4. Avoid getting constipated.  Between the surgery and the pain medications, it is common to experience some constipation.  Increasing fluid intake (64oz of water per day) and taking a fiber supplement (such as Metamucil, Citrucel, FiberCon) 1-2 times a day regularly will usually help prevent this problem from occurring.  Take Miralax (over the counter) 1-2x/day while taking a narcotic pain medication. If no bowel movement after 48hours, you may additionally take a laxative like a bottle of Milk of Magnesia which can be purchased over the counter. Avoid enemas if possible as these are often painful.   5. Watch out  for diarrhea.  If you have many loose bowel movements, simplify your diet to bland foods.  Stop any stool softeners and decrease your fiber supplement. If this worsens or does not improve, please call us.  6. Wash / shower every day.  If you were discharged with a dressing, you may remove this the day after your surgery. You may shower normally, getting soap/water on your wound, particularly after bowel movements.  7. Soaking in a warm bath filled a couple inches ("Sitz bath") is a great way to clean the area after a bowel movement and many patients find it is a way to soothe the area.  8. ACTIVITIES as tolerated:   a. You may resume regular (light) daily activities beginning the next day--such as daily self-care, walking, climbing stairs--gradually increasing activities as tolerated.  If you can walk 30 minutes without difficulty, it is safe to try more intense activity such as jogging, treadmill, bicycling, low-impact aerobics, etc. b. Refrain from any heavy lifting or straining for the first 2 weeks after your procedure, particularly if your surgery was for hemorrhoids. c. Avoid activities that make your pain worse d. You may drive when you are no longer taking prescription pain medication, you can comfortably wear a seatbelt, and you can safely maneuver your car and apply brakes.  9. FOLLOW UP in our office a. Please call [facility] at [phone] to set up an appointment to see your surgeon in the office for a follow-up appointment approximately 2 weeks after your surgery. b. Make sure that  you call for this appointment the day you arrive home to insure a convenient appointment time.  9. If you have disability or family leave forms that need to be completed, you may have them completed by your primary care physician's office; for return to work instructions, please ask our office staff and they will be happy to assist you in obtaining this documentation   When to call us [phone]: 1. Poor  pain control 2. Reactions / problems with new medications (rash/itching, etc)  3. Fever over 101.5 F (38.5 C) 4. Inability to urinate 5. Nausea/vomiting 6. Worsening swelling or bruising 7. Continued bleeding from incision. 8. Increased pain, redness, or drainage from the incision  The clinic staff is available to answer your questions during regular business hours (8:30am-5pm).  Please dont hesitate to call and ask to speak to one of our nurses for clinical concerns.   A surgeon from [facility] is always on call at the hospitals   If you have a medical emergency, go to the nearest emergency room or call 911.   [facility] [address], [address], [place], [place]  [id number] ? MAIN: [phone] FAX [phone] [email or web address]

## 2018-11-20 NOTE — Op Note (Signed)
11/20/2018  8:59 AM  PATIENT:  Martin Rose  42 y.o. male  Patient Care Team: Marthenia Rolling, DO as PCP - General  PRE-OPERATIVE DIAGNOSIS:  Anal fistula  POST-OPERATIVE DIAGNOSIS:  1. 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 Subcutaneous anal fistula  PROCEDURE:   1. Subcutaneous fistulotomy (posterior midline) 2. Partial fistulotomy (left anterior) with placement of cutting seton 3. Anorectal exam under anesthesia  SURGEON:  Surgeon(s): Andria Meuse, MD  ASSISTANT: OR staff   ANESTHESIA:   general  SPECIMEN:  No Specimen  DISPOSITION OF SPECIMEN:  N/A  COUNTS:  Sponge, needle and instrument counts were reported correct x2  PLAN OF CARE: Discharge to home after PACU  PATIENT DISPOSITION:  PACU - hemodynamically stable.  INDICATION: [name] is a very pleasant 42yoM with hx of HTN who underwent incision and drainage of a perianal abscess on 03/22/18 by Dr. Lindie Spruce. He was lost to follow-up after this but return to the office in early July for evaluation of a draining wound of the perianal area. He was seen in the office and offered EUA/possible fistulotomy/possible seton; he was in the process of scheduling when an abscess recurred  He presented to [facility] from the middle of August with a recurrent perianal abscess. He was taken the operative by Dr. Lindie Spruce for incision and drainage and was found to have a large perianal abscess in the left perianal area. He recovered well from this and was discharged home. He subsequently underwent EUA and was found to have a low likely trans-sphincteric anal fistula in the left anterior position. A seton was placed. He returns today for EUA, possible fistulotomy, possible LIFT, possible endorectal advancement flap  OR FINDINGS: Posterior midline subcutaneous fistulotomy at site of likely prior fissure. No muscle was involved in this. Left anterolateral fistula with blue vessel loop seton in  place and on the contralateral side had skin erosions - 3 separate spots corresponding to sites of suture knots. Internal opening on this was now lower and opened at the intersphincteric groove. The external opening was lateral to this. This involved a bundle of external sphincter and was not amenable to LIFT/advanement flap. Given this, a cutting seton was placed after the overlying anoderm was divided. This was done with a double stranded 0 Ethibond suture.  DESCRIPTION: The patient was identified in the preoperative holding area and taken to the OR where they were placed on the operating room table. SCDs were placed.  General endotracheal anesthesia was induced without difficulty. The patient was then positioned in prone jackknife position with buttocks gently taped apart. All pressure points were padded and then verified. The patient was then prepped and draped in usual sterile fashion.  A surgical timeout was performed indicating the correct patient, procedure, positioning and need for preoperative antibiotics.  A digital rectal exam was performed there were 3 punctate erosions on the contralateral buttock area on the right anterior perianal region consistent with where the seton knots lied. A blue vessel loop seton was in place left anterior position.  A Hill-Ferguson retractor was placed into the anal canal and circumferential inspection completed.  He did have a posterior midline subcutaneous anal fistula with an overlying bridge of skin and a primary fistulotomy was performed at this location.  The blue vessel loop seton was identified in the left anterior lateral position and removed.  A fistula probe was then passed through the fistula tract.  The internal opening had migrated distally and was well below the  dentate line and at the portion of the intersphincteric groove.  Given these findings, a LIFT/endorectal advancement flap or not feasible as the internal opening was on the intersphincteric  groove.  The overlying anoderm was incised with electrocautery and the fistula did involve a portion of the external sphincter.  Given these findings, a primary fistulotomy was deemed to be risky from a continence standpoint so a cutting seton was placed.  A double stranded 0 Ethibond suture was placed through the fistula tract and tied such that it was snug with the external sphincter but not tight enough that it was going to cause rapid ischemia of the muscle. Some of the surrounding anoderm was brought back together with 4-0 monocryl suture but not closed over the cutting seton.  A mixture of 0.25% Marcaine with epinephrine and Exparel was infiltrated as a perirectal block and into the subcutaneous tissues around his fistula.  A piece of Proctofoam was placed.  The anal area was covered with 4 x 4 gauze, an ABD pad, and mesh underwear.  The patient was then rolled back onto a stretcher, awakened from anesthesia, extubated, and transferred to PACU in satisfactory condition.  DISPOSITION: PACU in satisfactory condition.

## 2018-11-20 NOTE — Anesthesia Postprocedure Evaluation (Signed)
Anesthesia Post Note  Patient: Martin Rose  Procedure(s) Performed: ANORECTAL EXAM UNDER ANESTHESIA (N/A Rectum) FISTULOTOMY placement cutting seton (N/A Rectum)     Patient location during evaluation: PACU Anesthesia Type: General Level of consciousness: awake and alert Pain management: pain level controlled Vital Signs Assessment: post-procedure vital signs reviewed and stable Respiratory status: spontaneous breathing, nonlabored ventilation and respiratory function stable Cardiovascular status: blood pressure returned to baseline and stable Postop Assessment: no apparent nausea or vomiting Anesthetic complications: no    Last Vitals:  Vitals:   11/20/18 1000 11/20/18 1015  BP: 129/80 (!) (P) 137/100  Pulse: 63 76  Resp: 16 16  Temp:  36.6 C  SpO2: 100% 98%    Last Pain:  Vitals:   11/20/18 0945  PainSc: 7                  Kaylyn LayerKathryn E Zian Mohamed

## 2018-11-20 NOTE — Transfer of Care (Signed)
Immediate Anesthesia Transfer of Care Note  Patient: Martin Rose  Procedure(s) Performed: ANORECTAL EXAM UNDER ANESTHESIA (N/A Rectum) FISTULOTOMY placement cutting seton (N/A Rectum)  Patient Location: PACU  Anesthesia Type:General  Level of Consciousness: awake, alert , oriented and patient cooperative  Airway & Oxygen Therapy: Patient Spontanous Breathing and Patient connected to face mask  Post-op Assessment: Report given to RN and Post -op Vital signs reviewed and stable  Post vital signs: Reviewed and stable  Last Vitals:  Vitals Value Taken Time  BP 157/104 11/20/2018  9:09 AM  Temp    Pulse 103 11/20/2018  9:10 AM  Resp 19 11/20/2018  9:10 AM  SpO2 100 % 11/20/2018  9:10 AM  Vitals shown include unvalidated device data.  Last Pain:  Vitals:   11/20/18 0607  PainSc: 5          Complications: No apparent anesthesia complications

## 2018-11-20 NOTE — H&P (Signed)
CC: S/P EUA/seton - here today for surgery  HPI: Martin Rose is a very pleasant 42yoM with hx of HTN who underwent incision and drainage of a perianal abscess on 03/22/18 by Dr. Lindie SpruceWyatt. He was lost to follow-up after this but return to the office in early July for evaluation of a draining wound of the perianal area. He was seen in the office and offered EUA/possible fistulotomy/possible seton; he was in the process of scheduling when an abscess recurred  [facility] middle of August with a recurrent perianal abscess. He was taken the operative by Dr. Lindie SpruceWyatt for incision and drainage and was found to have a large perianal abscess in the left perianal area. He recovered well from this and was discharged home. He was seen in the office for follow-up.   He denies any history of incontinence to solid stool, liquid stool, nor gas.  He has never had a colonoscopy  He underwent EUA 09/11/18 where he was found to have a low trans-sphincteric anal fistula; a seton was placed. He recovered well from this until 2-3 days ago when he had recurrent pain deep in his gluteal cleft - L >R.   He denies any personal or family history of colorectal cancer, IBD/Crohn's/ulcerative colitis  PMH: Hypertension (well controlled on oral antihypertensive)  PSH: As stated above  FHx: Denies FHx of malignancy  Social: Denies use of tobacco/EtOH/drugs  ROS: A comprehensive 10 system review of systems was completed with the patient and pertinent findings as noted above.  Past Medical History:  Diagnosis Date  . History of rectal abscess   . Hypertension   . Palpitations 07/11/2017   per pt only feels when he over exerts himself without sob  . Perirectal fistula    wound/  left anterior    Past Surgical History:  Procedure Laterality Date  . anorectal exam under anesthesia     Dr. Cliffton AstersWhite  11-20-18 under anesthesia  . CARDIAC CATHETERIZATION  07-20-2006   dr Jenne Campusmcqueen   abnormal cardiolite--  no sig. cad, normal LVF, ef 60%, systemic HTN, elevated LVEDP (22)  . FISTULOTOMY N/A 09/11/2018   Procedure: FISTULOTOMY;  Surgeon: Andria MeuseWhite, Laketra Bowdish M, MD;  [facility];  Service: General;  Laterality: N/A;  . INCISION AND DRAINAGE PERIRECTAL ABSCESS Left 03/22/2018   Procedure: IRRIGATION AND DEBRIDEMENT PERIRECTAL ABSCESS;  Surgeon: Jimmye NormanWyatt, James, MD;  [facility];  Service: General;  Laterality: Left;  . INCISION AND DRAINAGE PERIRECTAL ABSCESS N/A 07/31/2018   Procedure: IRRIGATION AND DEBRIDEMENT PERIRECTAL ABSCESS;  Surgeon: Jimmye NormanWyatt, James, MD;  [facility];  Service: General;  Laterality: N/A;  . PLACEMENT OF SETON N/A 09/11/2018   Procedure: PLACEMENT OF SETON;  Surgeon: Andria MeuseWhite, Angelino Rumery M, MD;  [facility] [place];  Service: General;  Laterality: N/A;  . TRANSTHORACIC ECHOCARDIOGRAM  11/29/2011   moderate LVH, ef 55-60%,  grade 2 diastolic dysfunction/  trivial MR and TR/  mild LAE    Family History  Problem Relation Age of Onset  . Hypertension Mother   . Hypertension Father   . Cancer Sister 30       brain tumor  . Stroke Sister     Social:  reports that he quit smoking about 9 years ago. His smoking use included cigarettes. He has a 7.50 pack-year smoking history. He has never used smokeless tobacco. He reports that he drinks about 24.0 standard drinks of alcohol per week. He reports that he has current  or past drug history. Drug: Marijuana.  Allergies: No Known Allergies  Medications: I have reviewed the patient's current medications.  No results found for this or any previous visit (from the past 48 hour(s)).  No results found.  ROS - all of the below systems have been reviewed with the patient and positives are indicated with bold text General: chills, fever or night sweats Eyes: blurry vision or double vision ENT: epistaxis or sore throat Allergy/Immunology: itchy/watery eyes or nasal  congestion Hematologic/Lymphatic: bleeding problems, blood clots or swollen lymph nodes Endocrine: temperature intolerance or unexpected weight changes Breast: new or changing breast lumps or nipple discharge Resp: cough, shortness of breath, or wheezing CV: chest pain or dyspnea on exertion GI: as per HPI GU: dysuria, trouble voiding, or hematuria MSK: joint pain or joint stiffness Neuro: TIA or stroke symptoms Derm: pruritus and skin lesion changes Psych: anxiety and depression  PE Blood pressure 138/84, pulse (!) 56, temperature 98.1 F (36.7 C), resp. rate 14, height 6' (1.829 m), weight 124.7 kg, SpO2 99 %. Constitutional: NAD; conversant; no deformities Eyes: Moist conjunctiva; no lid lag; anicteric; PERRL Neck: Trachea midline; no thyromegaly Lungs: Normal respiratory effort; no tactile fremitus CV: RRR; no palpable thrills; no pitting edema GI: Abd soft, NT/ND; no palpable hepatosplenomegaly MSK: Normal gait; no clubbing/cyanosis Psychiatric: Appropriate affect; alert and oriented x3 Lymphatic: No palpable cervical or axillary lymphadenopathy  A/P: [name] is a very pleasant 42yoM with hx of HTN, s/p I&D perianal abscess 03/22/18 & then 07/31/18 - now s/p EUA/seton 09/11/18 for low trans-sphincteric perirectal fistula - left anterior - here today for surgery  -The anatomy and physiology of the anal canal was discussed at length with the patient. The pathophysiology of anal abscess and fistula was discussed at length as well today -Planning anorectal exam under anesthesia, possible fistulotomy, possible ligation of intersphincerteric fistula, possible endorectal advancement flap -- all based on intraoperative findings - if shallow, planning fistulotomy, otherwise LIFT/ERAF -The planned procedure, material risks (including, but not limited to, pain, bleeding, infection, scarring, need for blood transfusion, damage to anal sphincter, incontinence of gas and/or stool, need for  additional procedures, recurrence, pneumonia, heart attack, stroke, death) benefits and alternatives to surgery were discussed at length. The patient's questions were answered to his satisfaction, he voiced understanding and elected to proceed with surgery. Additionally, we discussed typical postoperative expectations and the recovery process.  Stephanie Coup. Cliffton Asters, M.D. General and Colorectal Surgery [facility]

## 2018-11-20 NOTE — Anesthesia Procedure Notes (Signed)
Procedure Name: Intubation Date/Time: 11/20/2018 7:40 AM Performed by: Vanessa Durhamochran, Azucena Dart Glenn, CRNA Pre-anesthesia Checklist: Patient identified, Emergency Drugs available, Suction available and Patient being monitored Patient Re-evaluated:Patient Re-evaluated prior to induction Oxygen Delivery Method: Circle system utilized Preoxygenation: Pre-oxygenation with 100% oxygen Induction Type: IV induction Ventilation: Two handed mask ventilation required Laryngoscope Size: 2 and Miller Grade View: Grade I Tube type: Oral Tube size: 7.5 mm Number of attempts: 1 Airway Equipment and Method: Stylet Placement Confirmation: ETT inserted through vocal cords under direct vision,  positive ETCO2 and breath sounds checked- equal and bilateral Secured at: 23 cm Tube secured with: Tape Dental Injury: Teeth and Oropharynx as per pre-operative assessment

## 2018-11-21 ENCOUNTER — Encounter (HOSPITAL_COMMUNITY): Payer: Self-pay | Admitting: Surgery

## 2019-01-21 ENCOUNTER — Emergency Department (HOSPITAL_COMMUNITY)
Admission: EM | Admit: 2019-01-21 | Discharge: 2019-01-21 | Disposition: A | Discharge status: Home / Self Care | Payer: BLUE CROSS/BLUE SHIELD | Attending: Emergency Medicine | Admitting: Emergency Medicine

## 2019-01-21 ENCOUNTER — Encounter (HOSPITAL_COMMUNITY): Payer: Self-pay | Admitting: Emergency Medicine

## 2019-01-21 ENCOUNTER — Emergency Department (HOSPITAL_COMMUNITY): Payer: BLUE CROSS/BLUE SHIELD

## 2019-01-21 DIAGNOSIS — L0231 Cutaneous abscess of buttock: Secondary | ICD-10-CM | POA: Insufficient documentation

## 2019-01-21 DIAGNOSIS — I10 Essential (primary) hypertension: Secondary | ICD-10-CM | POA: Insufficient documentation

## 2019-01-21 DIAGNOSIS — R222 Localized swelling, mass and lump, trunk: Secondary | ICD-10-CM | POA: Diagnosis present

## 2019-01-21 DIAGNOSIS — Z79899 Other long term (current) drug therapy: Secondary | ICD-10-CM | POA: Diagnosis not present

## 2019-01-21 DIAGNOSIS — Z87891 Personal history of nicotine dependence: Secondary | ICD-10-CM | POA: Insufficient documentation

## 2019-01-21 LAB — BASIC METABOLIC PANEL
Anion gap: 13 (ref 5–15)
BUN: 8 mg/dL (ref 6–20)
CO2: 23 mmol/L (ref 22–32)
Calcium: 9.1 mg/dL (ref 8.9–10.3)
Chloride: 102 mmol/L (ref 98–111)
Creatinine, Ser: 1.28 mg/dL — ABNORMAL HIGH (ref 0.61–1.24)
GFR calc Af Amer: >60 mL/min (ref >60)
GFR calc non Af Amer: >60 mL/min (ref >60)
Glucose, Bld: 104 mg/dL — ABNORMAL HIGH (ref 70–99)
Potassium: 4.3 mmol/L (ref 3.5–5.1)
Sodium: 138 mmol/L (ref 135–145)

## 2019-01-21 LAB — CBC WITH DIFFERENTIAL/PLATELET
Abs Immature Granulocytes: 0.06 10*3/uL (ref 0.00–0.07)
Basophils Absolute: 0.1 10*3/uL (ref 0.0–0.1)
Basophils Relative: 0 %
Eosinophils Absolute: 0.1 10*3/uL (ref 0.0–0.5)
Eosinophils Relative: 1 %
HEMATOCRIT: 40.8 % (ref 39.0–52.0)
Hemoglobin: 13 g/dL (ref 13.0–17.0)
Immature Granulocytes: 0 %
LYMPHS ABS: 3.3 10*3/uL (ref 0.7–4.0)
Lymphocytes Relative: 21 %
MCH: 26.6 pg (ref 26.0–34.0)
MCHC: 31.9 g/dL (ref 30.0–36.0)
MCV: 83.4 fL (ref 80.0–100.0)
Monocytes Absolute: 1.7 10*3/uL — ABNORMAL HIGH (ref 0.1–1.0)
Monocytes Relative: 11 %
Neutro Abs: 10.3 10*3/uL — ABNORMAL HIGH (ref 1.7–7.7)
Neutrophils Relative %: 67 %
Platelets: 258 10*3/uL (ref 150–400)
RBC: 4.89 MIL/uL (ref 4.22–5.81)
RDW: 14.2 % (ref 11.5–15.5)
WBC: 15.5 10*3/uL — ABNORMAL HIGH (ref 4.0–10.5)
nRBC: 0 % (ref 0.0–0.2)

## 2019-01-21 MED ORDER — MORPHINE SULFATE (PF) 4 MG/ML IV SOLN
4.0000 mg | Freq: Once | INTRAVENOUS | Status: AC
  Administered 2019-01-21: 4 mg via INTRAVENOUS
  Filled 2019-01-21: qty 1

## 2019-01-21 MED ORDER — HYDROMORPHONE HCL 1 MG/ML IJ SOLN: 1.0000 mg | Freq: Once | INTRAMUSCULAR | Status: DC | PRN

## 2019-01-21 MED ORDER — HYDROCODONE-ACETAMINOPHEN 5-325 MG PO TABS: 1.0000 | ORAL_TABLET | Freq: Four times a day (QID) | ORAL | 0 refills | Status: DC | PRN

## 2019-01-21 MED ORDER — HYDROMORPHONE HCL 1 MG/ML IJ SOLN: 1.0000 mg | Freq: Once | INTRAMUSCULAR | Status: DC

## 2019-01-21 MED ORDER — PIPERACILLIN-TAZOBACTAM 3.375 G IVPB 30 MIN
3.3750 g | Freq: Once | INTRAVENOUS | Status: AC
  Administered 2019-01-21: 3.375 g via INTRAVENOUS
  Filled 2019-01-21: qty 50

## 2019-01-21 MED ORDER — SODIUM CHLORIDE 0.9 % IV BOLUS
500.0000 mL | Freq: Once | INTRAVENOUS | Status: AC
  Administered 2019-01-21: 500 mL via INTRAVENOUS

## 2019-01-21 MED ORDER — AMOXICILLIN-POT CLAVULANATE 875-125 MG PO TABS: 1.0000 | ORAL_TABLET | Freq: Two times a day (BID) | ORAL | 0 refills | Status: AC

## 2019-01-21 MED ORDER — IOHEXOL 300 MG/ML  SOLN
100.0000 mL | Freq: Once | INTRAMUSCULAR | Status: AC | PRN
  Administered 2019-01-21: 100 mL via INTRAVENOUS

## 2019-01-21 NOTE — ED Provider Notes (Signed)
Medical screening examination/treatment/procedure(s) were conducted as a shared visit with non-physician practitioner(s) and myself.  I personally evaluated the patient during the encounter.  Clinical Impression:   Final diagnoses:  Abscess of left buttock   Patient is a 43 year old male, I am familiar with this unfortunate male who tends to get recurrent perianal or gluteal cleft abscesses.  He had most recently had this drained back in December.  He states he has had recurrent pain and swelling with some purulent drainage prehospital.  He denies fevers but does endorse some chills.  On exam the patient does not appear to be in any distress, he does have some swelling of his left gluteal cheek, on exam there is some small areas where there are draining sinuses.  There is a small amount of clear drainage coming out, there is no cellulitis overlying this but the soft tissue is full.  There is no fluctuance or induration.  White blood cell count is elevated, CT scan shows likely recurring cellulitis but no definite abscess.  Given that there is fluid coming out of these draining sinuses will discuss with surgery, antibiotics have been given, may need MRSA coverage as well     Eber Hong, MD 01/24/19 1004

## 2019-01-21 NOTE — Discharge Instructions (Addendum)
You were given a prescription for antibiotics. Please take the antibiotic prescription fully.    Prescription given for Norco. Take medication as directed and do not operate machinery, drive a car, or work while taking this medication as it can make you drowsy.   Please call your general surgery office to tomorrow to make an appointment for follow-up next week.  Monitor your symptoms closely.  If you develop fevers, worsening pain to the left buttock, increasing drainage from the wound, increasing redness/warmth/swelling to the left buttock, return to the emergency department immediately.

## 2019-01-21 NOTE — ED Triage Notes (Signed)
Pt. Stated, I have a boil in my anus that busted during the night and its still oozing.

## 2019-01-21 NOTE — ED Provider Notes (Signed)
[facility] Provider Note   CSN: [id number] Arrival date & time: 01/21/19  [id number]     History   Chief Complaint Chief Complaint  Patient presents with  . Abscess    HPI Martin Rose is a 43 y.o. male.  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  Patient states that 2 days ago began having pain to the left side of the buttock.  Last night he started having drainage of purulent fluid.  Pain is severe and constant in nature.  Worse with palpation.  He denies any known fevers but states he has had chills.  No abdominal pain, nausea, vomiting, diarrhea or urinary symptoms.  Past Medical History:  Diagnosis Date  . History of rectal abscess   . Hypertension   . Palpitations 07/11/2017   per pt only feels when he over exerts himself without sob  . Perirectal fistula    wound/  left anterior    Patient Active Problem List   Diagnosis Date Noted  . Perianal abscess 07/30/2018  . Skin rash 05/22/2018  . Peri-rectal abscess 03/23/2018  . Rectal abscess 03/22/2018  . Perirectal abscess 03/22/2018  . Palpitations 07/11/2017  . Alcohol use 07/11/2017  . Allergic rhinitis 06/24/2015  . Morbid obesity (HCC) 04/30/2013  . Snoring 04/30/2013  . Hidradenitis 09/04/2012  . Essential hypertension, benign 03/10/2009    Past Surgical History:  Procedure Laterality Date  . ANAL FISTULOTOMY N/A 11/20/2018   Procedure: FISTULOTOMY placement cutting seton;  Surgeon: Andria MeuseWhite, Christopher M, MD;  Location: [facility];  Service: General;  Laterality: N/A;  . anorectal exam under anesthesia     Dr. Cliffton AstersWhite  11-20-18 under anesthesia  . CARDIAC CATHETERIZATION  07-20-2006   dr Jenne Campusmcqueen   abnormal cardiolite--  no sig. cad, normal LVF, ef 60%, systemic HTN, elevated  LVEDP (22)  . FISTULOTOMY N/A 09/11/2018   Procedure: FISTULOTOMY;  Surgeon: Andria MeuseWhite, Christopher M, MD;  [facility];  Service: General;  Laterality: N/A;  . INCISION AND DRAINAGE PERIRECTAL ABSCESS Left 03/22/2018   Procedure: IRRIGATION AND DEBRIDEMENT PERIRECTAL ABSCESS;  Surgeon: Jimmye NormanWyatt, James, MD;  [facility];  Service: General;  Laterality: Left;  . INCISION AND DRAINAGE PERIRECTAL ABSCESS N/A 07/31/2018   Procedure: IRRIGATION AND DEBRIDEMENT PERIRECTAL ABSCESS;  Surgeon: Jimmye NormanWyatt, James, MD;  [facility];  Service: General;  Laterality: N/A;  . PLACEMENT OF SETON N/A 09/11/2018   Procedure: PLACEMENT OF SETON;  Surgeon: Andria MeuseWhite, Christopher M, MD;  [facility] [place];  Service: General;  Laterality: N/A;  . RECTAL EXAM UNDER ANESTHESIA N/A 11/20/2018   Procedure: ANORECTAL EXAM UNDER ANESTHESIA;  Surgeon: Andria MeuseWhite, Christopher M, MD;  Location: [facility];  Service: General;  Laterality: N/A;  . TRANSTHORACIC ECHOCARDIOGRAM  11/29/2011   moderate LVH, ef 55-60%,  grade 2 diastolic dysfunction/  trivial MR and TR/  mild LAE        Home Medications    Prior to Admission medications   Medication Sig Start Date End Date Taking? Authorizing Provider  amLODipine (NORVASC) 10 MG tablet Take 1 tablet (10 mg total) by mouth every morning. 11/03/18 02/01/19  Marthenia RollingBland, Scott, DO  amoxicillin-clavulanate (AUGMENTIN) 875-125 MG tablet Take 1 tablet by mouth every 12 (twelve) hours for 7 days. 01/21/19 01/28/19  Court Gracia S, PA-C  atenolol (  TENORMIN) 50 MG tablet Take 1 tablet (50 mg total) by mouth every morning. 11/03/18 02/01/19  Marthenia Rolling, DO  HYDROcodone-acetaminophen (NORCO/VICODIN) 5-325 MG tablet Take 1 tablet by mouth every 6 (six) hours as needed. 01/21/19   Jaimy Kliethermes S, PA-C  ibuprofen (ADVIL,MOTRIN) 200 MG tablet Take 1-4 tablets (200-800 mg total) by mouth every 6 (six) hours as needed (for pain). Patient taking differently: Take 600-800 mg by  mouth every 6 (six) hours as needed for headache or moderate pain.  08/02/18   Barnetta Chapel, PA-C  lidocaine (XYLOCAINE) 5 % ointment Apply 1 application topically daily as needed for moderate pain.    [provider]  oxyCODONE (ROXICODONE) 5 MG immediate release tablet Take 2 tablets (10 mg total) by mouth every 6 (six) hours as needed for severe pain (severe postop pain not controlled with tylenol/ibuprofen). 11/20/18   Andria Meuse, MD    Family History Family History  Problem Relation Age of Onset  . Hypertension Mother   . Hypertension Father   . Cancer Sister 30       brain tumor  . Stroke Sister     Social History Social History   Tobacco Use  . Smoking status: Former Smoker    Packs/day: 0.50    Years: 15.00    Pack years: 7.50    Types: Cigarettes    Last attempt to quit: 2010    Years since quitting: 10.1  . Smokeless tobacco: Never Used  Substance Use Topics  . Alcohol use: Yes    Alcohol/week: 24.0 standard drinks    Types: 24 Cans of beer per week    Comment: 2 beers a day  . Drug use: Yes    Types: Marijuana    Comment: 03/22/2018 "2-3 times/wk"     Allergies   Patient has no known allergies.   Review of Systems Review of Systems  HENT: Negative for congestion.   Eyes: Negative for visual disturbance.  Respiratory: Negative for shortness of breath.   Cardiovascular: Negative for chest pain.  Gastrointestinal: Negative for abdominal pain, constipation, diarrhea, nausea and vomiting.       Perirectal abscess  Genitourinary: Negative for dysuria.  Musculoskeletal: Negative for myalgias.  Skin: Positive for wound.  Neurological: Negative for headaches.   Physical Exam Updated Vital Signs BP 111/68   Pulse 70   Temp 99.8 F (37.7 C) (Rectal)   Resp 17   Ht 6' (1.829 m)   Wt 124.7 kg   SpO2 96%   BMI 37.30 kg/m   Physical Exam Vitals signs and nursing note reviewed.  Constitutional:      Appearance: He is well-developed.    HENT:     Head: Normocephalic and atraumatic.  Eyes:     Conjunctiva/sclera: Conjunctivae normal.  Neck:     Musculoskeletal: Neck supple.  Cardiovascular:     Rate and Rhythm: Normal rate and regular rhythm.     Heart sounds: Normal heart sounds. No murmur.  Pulmonary:     Effort: Pulmonary effort is normal. No respiratory distress.     Breath sounds: Normal breath sounds. No wheezing.  Abdominal:     General: Bowel sounds are normal.     Palpations: Abdomen is soft.     Tenderness: There is no abdominal tenderness.  Genitourinary:    Comments: Chaperone present. Estimated 15cm x 7cm area of erythema to the left buttock with erythema extending towards to the rectum. There is induration on the borders of the erythema  with a central area of fluctuance. Area very ttp. Wound is actively draining copious purulent fluid with opening slightly lateral to the rectum. No swelling or erythema to the scrotum. Skin:    General: Skin is warm and dry.  Neurological:     Mental Status: He is alert.  Psychiatric:        Mood and Affect: Mood normal.        ED Treatments / Results  Labs (all labs ordered are listed, but only abnormal results are displayed) Labs Reviewed  CBC WITH DIFFERENTIAL/PLATELET - Abnormal; Notable for the following components:      Result Value   WBC 15.5 (*)    Neutro Abs 10.3 (*)    Monocytes Absolute 1.7 (*)    All other components within normal limits  BASIC METABOLIC PANEL - Abnormal; Notable for the following components:   Glucose, Bld 104 (*)    Creatinine, Ser 1.28 (*)    All other components within normal limits    EKG None  Radiology Ct Pelvis W Contrast  Result Date: 01/21/2019 CLINICAL DATA:  Pt. Stated, I have a boil in my anus that busted during the night and its still oozing. EXAM: CT PELVIS WITH CONTRAST TECHNIQUE: Multidetector CT imaging of the pelvis was performed using the standard protocol following the bolus administration of  intravenous contrast. CONTRAST:  100mL OMNIPAQUE IOHEXOL 300 MG/ML  SOLN COMPARISON:  A CT of the pelvis 09/19/2018 FINDINGS: Urinary Tract:  No abnormality visualized. Bowel:  Unremarkable visualized pelvic bowel loops. Vascular/Lymphatic: Vessels are unremarkable. Small bilateral common femoral lymph nodes are present, largest on the LEFT measuring 1.5 centimeters. Nonspecific bilateral inguinal lymph nodes. Reproductive:  No mass or other significant abnormality Other: There is mild induration in the MEDIAL LEFT gluteal region, along the gluteal cleft. The Seton in is no longer visible. The area of induration measures 8.4 centimeters in length and 1.5 centimeters in greatest width. There is mild subcutaneous edema within the LEFT buttock. No definite abscess identified. Musculoskeletal: No suspicious bone lesions identified. IMPRESSION: Mild induration in the MEDIAL LEFT gluteal region, along the gluteal cleft. The Seton in is no longer visible. No definite abscess identified. Electronically Signed   By: Norva PavlovElizabeth  Brown M.D.   On: 01/21/2019 12:57    Procedures Procedures (including critical care time)  Medications Ordered in ED Medications  HYDROmorphone (DILAUDID) injection 1 mg (has no administration in time range)  morphine 4 MG/ML injection 4 mg (4 mg Intravenous Given 01/21/19 1049)  sodium chloride 0.9 % bolus 500 mL (0 mLs Intravenous Stopped 01/21/19 1123)  piperacillin-tazobactam (ZOSYN) IVPB 3.375 g (0 g Intravenous Stopped 01/21/19 1113)  morphine 4 MG/ML injection 4 mg (4 mg Intravenous Given 01/21/19 1149)  iohexol (OMNIPAQUE) 300 MG/ML solution 100 mL (100 mLs Intravenous Contrast Given 01/21/19 1212)     Initial Impression / Assessment and Plan / ED Course  I have reviewed the triage vital signs and the nursing notes.  Pertinent labs & imaging results that were available during my care of the patient were reviewed by me and considered in my medical decision making (see chart for  details).  [facility] [place] [facility] reviewed and patient has no red flags.  Final Clinical Impressions(s) / ED Diagnoses   Final diagnoses:  Abscess of axilla, left   Pt presenting with recurrent perirectal abscess. Sxs began 2 days prior to this and he states that abscess began draining spontaneously last night. Denies fevers. VSS today.   Pt has  actively draining wound to the left gluteal region just lateral to the anus on exam with surrounding erythema, ttp and induration, and multiple areas of fluctuance.   Cbc with leukocytosis.  No anemia noted.  BMP with slightly elevated creatinine at 1.28, increased from prior.  Normal BUN.  CT of the pelvis shows mild induration to the medial left gluteal region along the gluteal cleft with no definite abscess identified.  He was given pain medications, IV fluids and a dose of Zosyn in the ED.  Discussed case with Dr. Dwain Sarna with general surgery who recommends giving the patient Augmentin and having the patient follow-up in the office.  Discussed findings and plan with the patient.    He states that he does feel improved after medications in the ED.  Discussed [place] signs and symptoms I would require him to return to the emergency department immediately. He voices understanding. All questions answered.  ED Discharge Orders         Ordered    amoxicillin-clavulanate (AUGMENTIN) 875-125 MG tablet  Every 12 hours     01/21/19 1428    HYDROcodone-acetaminophen (NORCO/VICODIN) 5-325 MG tablet  Every 6 hours PRN     01/21/19 1428           Orvill Coulthard S, PA-C 01/21/19 1430    Eber Hong, MD 01/24/19 1005

## 2019-01-30 ENCOUNTER — Other Ambulatory Visit: Payer: Self-pay | Admitting: Family Medicine

## 2019-01-30 DIAGNOSIS — I10 Essential (primary) hypertension: Secondary | ICD-10-CM

## 2019-02-02 ENCOUNTER — Ambulatory Visit: Payer: BLUE CROSS/BLUE SHIELD

## 2019-04-13 DIAGNOSIS — G8929 Other chronic pain: Secondary | ICD-10-CM | POA: Insufficient documentation

## 2019-04-13 DIAGNOSIS — M546 Pain in thoracic spine: Secondary | ICD-10-CM | POA: Insufficient documentation

## 2019-04-30 ENCOUNTER — Other Ambulatory Visit: Payer: Self-pay | Admitting: Family Medicine

## 2019-04-30 DIAGNOSIS — I10 Essential (primary) hypertension: Secondary | ICD-10-CM

## 2019-05-15 ENCOUNTER — Ambulatory Visit (INDEPENDENT_AMBULATORY_CARE_PROVIDER_SITE_OTHER): Payer: Self-pay | Admitting: Gastroenterology

## 2019-05-15 ENCOUNTER — Encounter: Payer: Self-pay | Admitting: Gastroenterology

## 2019-05-15 ENCOUNTER — Other Ambulatory Visit: Payer: Self-pay

## 2019-05-15 DIAGNOSIS — K611 Rectal abscess: Secondary | ICD-10-CM

## 2019-05-15 NOTE — Progress Notes (Signed)
The patient cancelled his telehealth consultation after the encounter was already started in EPIC. I did not see the patient today or speak with him by phone.

## 2019-07-26 ENCOUNTER — Other Ambulatory Visit: Payer: Self-pay | Admitting: Family Medicine

## 2019-07-26 DIAGNOSIS — I10 Essential (primary) hypertension: Secondary | ICD-10-CM

## 2019-07-30 DIAGNOSIS — M419 Scoliosis, unspecified: Secondary | ICD-10-CM | POA: Insufficient documentation

## 2019-07-30 DIAGNOSIS — N521 Erectile dysfunction due to diseases classified elsewhere: Secondary | ICD-10-CM | POA: Insufficient documentation

## 2019-10-17 ENCOUNTER — Other Ambulatory Visit: Payer: Self-pay | Admitting: Family Medicine

## 2019-10-17 DIAGNOSIS — I10 Essential (primary) hypertension: Secondary | ICD-10-CM

## 2019-12-26 ENCOUNTER — Ambulatory Visit: Payer: BLUE CROSS/BLUE SHIELD | Admitting: Family Medicine

## 2020-01-24 ENCOUNTER — Ambulatory Visit: Payer: Self-pay | Admitting: Surgery

## 2020-01-24 NOTE — H&P (Signed)
CC: Here for f/u, hx of cutting seton  HPI: [name] is a very pelasant 6yoM with hx of HTN who underwent I&D of perianal abscess on 03/22/18 by Dr. Hulen Skains. He was lost to follow-up after this but return to the office in early July 2019 for evaluation of a draining wound of the perianal area. He was seen in the office and offered EUA/possible fistulotomy/possible seton; he was in the process of scheduling when an abscess recurred  He presented to [facility] in the middle of August with a recurrent perianal abscess. He was taken the operative by Dr. Hulen Skains for incision and drainage and was found to have a large perianal abscess in the left perianal area. He recovered well from this and was discharged home. He was seen in the office for follow-up.   He denied any history of incontinence to solid stool, liquid stool, nor gas.  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 He developed significant pain in the perianal area about 10-12 days ago. He is able to sit and having a lot of pain. He underwent CT scan to rule out abscess and none was found. After getting his pain control we were able to examine him better and appeared that the seton had become more snug likely due to body habitus and was pinching some of the perianal skin.  OR 11/20/18 - partial fistulotomy and placement of cuting seton (found to have intersphincteric fistula which appeared to only involve external sphincter muscle). Seton subsequently migrated out on his own approximately 4 weeks later. He recovered well from all this. He was lost to follow-up. He returns today after having been to the emergency room last week with recurrent left-sided perianal discomfort and is spontaneously draining gluteal abscess. He was given  antibiotics and discharged. CT pelvis demonstrated no obvious abscess. He returns today for follow-up. He reports doing much better over the last 5 days. He denies any fever/chills. He reports the gluteal pain he had has been improving. He reports scant drainage from the abscess cavity that open.  He denies any issues with incontinence to gas, liquid or solid stool  INTERVAL HX He has been doing well since I saw in the office, had complete resolution of all the symptoms including pain. We had discussed proceeding with EUA and his last office visit that he ultimately declined to schedule. He had persistent drainage at that point in time but that had resolved. He had returned to work and even doing long drives on the road to [place]. Over the last couple weeks she's had some intermittent pains in the perianal area at the exact area where he had his previous fistula. He's had a small amount of purulent discharge from where his previous seton was. He denies any active pending this time, fever/chills. The drainage has subsided.  PMH: Hypertension (well controlled on oral antihypertensive)  PSH: As stated above  FHx: Denies FHx of malignancy  Social: Denies use of tobacco/EtOH/drugs  ROS: A comprehensive 10 system review of systems was completed with the patient and pertinent findings as noted above.  The patient is a 44 year old male.   Allergies Sabino Gasser, CMA; 01/16/2020 1:33 PM) No Known Drug Allergies [03/22/2018]: Allergies Reconciled   Medication History Sabino Gasser, CMA; 01/16/2020 1:33 PM) AmLODIPine Besylate (10MG   Tablet, Oral) Active. Atenolol (50MG  Tablet, Oral) Active. Medications Reconciled    Review of Systems M. Anali Cabanilla MD; 01/16/2020 1:40 PM) General Not Present- Chills and Fever. HEENT Not Present- Blurred Vision and Headache. Neck Not Present- Neck Mass and Neck Pain. Respiratory Not Present- Cough and Difficulty Breathing on  Exertion. Cardiovascular Not Present- Chest Pain, Difficulty Breathing Lying Down, Leg Cramps, Palpitations, Rapid Heart Rate, Shortness of Breath and Swelling of Extremities. Gastrointestinal Not Present- Abdominal Pain, Bloating, Bloody Stool, Change in Bowel Habits, Chronic diarrhea, Constipation, Difficulty Swallowing, Excessive gas, Gets full quickly at meals, Hemorrhoids, Indigestion, Nausea, Rectal Pain and Vomiting. Musculoskeletal Not Present- Back Pain, Joint Pain, Joint Stiffness, Muscle Pain, Muscle Weakness and Swelling of Extremities. Psychiatric Not Present- Anxiety and Depression. Endocrine Not Present- Appetite Changes and Cold Intolerance. Hematology Not Present- Blood Thinners, Easy Bruising, Excessive bleeding, Gland problems, HIV and Persistent Infections.  Vitals 03/15/2020 CMA; 01/16/2020 1:34 PM) 01/16/2020 1:33 PM Weight: 296 lb Height: 72in Body Surface Area: 2.52 m Body Mass Index: 40.14 kg/m  Temp.: 98.39F(Tympanic)  Pulse: 82 (Regular)  BP: 138/82 (Sitting, Left Arm, Standard)       Physical Exam 03/15/2020 M. Arrie Borrelli MD; 01/16/2020 1:44 PM) The physical exam findings are as follows: Note:Constitutional: No acute distress; conversant; no deformities Eyes: Moist conjunctiva; no lid lag; anicteric sclerae; pupils equal round and reactive to light Neck: Trachea midline; no palpable thyromegaly Lungs: Normal respiratory effort; no tactile fremitus CV: Regular rate and rhythm; no palpable thrill; no pitting edema GI: Abdomen soft, nontender, nondistended; no palpable hepatosplenomegaly Anorectal: Left anterolateral buttock at site of previous external opening, skin dimpling and scant purulent stain to opening; no fluctuance, nor erythema. No palpable fullness to rectal wall. Contralateral buttock/perianal area normal. Anorectal: Left buttock with opened abscess cavity - no erythema/tenderness; no fluctuance; no signs of ongoign abscess. MSK: Normal  gait; no clubbing/cyanosis Psychiatric: Appropriate affect; alert and oriented 3 Lymphatic: No palpable cervical or axillary lymphadenopathy    Assessment & Plan 03/15/2020 M. 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 Stille is a very pleasant 42yoM with hx of HTN- now s/p seton followed by EUA/cutting seton 11/2018 - here for f/u Impression: -We discussed this now could represent a chronic sinus/cavity versus a recurrence of his fistula. We discussed further observation versus examination under anesthesia, possible incision/drainage, possible fistulotomy possible seton. -The anatomy and physiology of the anal canal was discussed at length with the patient. The pathophysiology of anal abscess and fistula was discussed at length with associated pictures and illustrations. -The procedures, material risks (including, but not limited to, pain, bleeding, infection, scarring, need for blood transfusion, damage to anal sphincter, incontinence of gas and/or stool, need for additional procedures, recurrence, pneumonia, heart attack, stroke, death) benefits and alternatives to surgery were discussed at length. The patient's questions were answered to his satisfaction, he voiced understanding and after considering all options, has elected to proceed with surgery. Additionally, we discussed typical postoperative expectations and the recovery process.  Signed by 12/2018, MD (01/16/2020 1:46 PM)

## 2020-02-08 ENCOUNTER — Other Ambulatory Visit: Payer: Self-pay

## 2020-02-08 ENCOUNTER — Encounter (HOSPITAL_BASED_OUTPATIENT_CLINIC_OR_DEPARTMENT_OTHER): Payer: Self-pay | Admitting: Surgery

## 2020-02-08 NOTE — Progress Notes (Signed)
Spoke w/ via phone for pre-op interview---[place] spouse Lab needs dos---- I stat 8, ekg            COVID test ------02-11-2020 Arrive at -------800 am 02-14-2020 NPO after ------midnight Medications to take morning of surgery -----atenolol, amlodipine Diabetic medication -----n/a Patient Special Instructions -----per dr white: if patient unable to do fleets enema hs and am of surgery may take milk of magnesia with clear liquids evening before per [place] spouse Pre-Op special Istructions ----- Patient verbalized understanding of instructions that were given at this phone interview. Patient denies shortness of breath, chest pain, fever, cough a this phone interview.

## 2020-02-11 ENCOUNTER — Other Ambulatory Visit (HOSPITAL_COMMUNITY)
Admission: RE | Admit: 2020-02-11 | Discharge: 2020-02-11 | Disposition: A | Discharge status: Home / Self Care | Payer: 59 | Source: Ambulatory Visit | Attending: Surgery | Admitting: Surgery

## 2020-02-11 ENCOUNTER — Other Ambulatory Visit (HOSPITAL_COMMUNITY): Payer: Self-pay

## 2020-02-11 DIAGNOSIS — Z20822 Contact with and (suspected) exposure to covid-19: Secondary | ICD-10-CM | POA: Insufficient documentation

## 2020-02-11 DIAGNOSIS — Z01812 Encounter for preprocedural laboratory examination: Secondary | ICD-10-CM | POA: Diagnosis present

## 2020-02-11 LAB — SARS CORONAVIRUS 2 (TAT 6-24 HRS): SARS Coronavirus 2: NEGATIVE

## 2020-02-14 ENCOUNTER — Ambulatory Visit (HOSPITAL_BASED_OUTPATIENT_CLINIC_OR_DEPARTMENT_OTHER)
Admission: RE | Admit: 2020-02-14 | Discharge: 2020-02-14 | Disposition: A | Discharge status: Home / Self Care | Payer: 59 | Attending: Surgery | Admitting: Surgery

## 2020-02-14 ENCOUNTER — Encounter (HOSPITAL_BASED_OUTPATIENT_CLINIC_OR_DEPARTMENT_OTHER): Payer: Self-pay | Admitting: Surgery

## 2020-02-14 ENCOUNTER — Encounter (HOSPITAL_BASED_OUTPATIENT_CLINIC_OR_DEPARTMENT_OTHER)
Admission: RE | Disposition: A | Discharge status: Home / Self Care | Payer: Self-pay | Source: Home / Self Care | Attending: Surgery

## 2020-02-14 ENCOUNTER — Ambulatory Visit (HOSPITAL_BASED_OUTPATIENT_CLINIC_OR_DEPARTMENT_OTHER): Payer: 59 | Admitting: Certified Registered"

## 2020-02-14 ENCOUNTER — Other Ambulatory Visit: Payer: Self-pay

## 2020-02-14 DIAGNOSIS — K610 Anal abscess: Secondary | ICD-10-CM | POA: Insufficient documentation

## 2020-02-14 DIAGNOSIS — Z8249 Family history of ischemic heart disease and other diseases of the circulatory system: Secondary | ICD-10-CM | POA: Insufficient documentation

## 2020-02-14 DIAGNOSIS — Z87891 Personal history of nicotine dependence: Secondary | ICD-10-CM | POA: Diagnosis not present

## 2020-02-14 DIAGNOSIS — I10 Essential (primary) hypertension: Secondary | ICD-10-CM | POA: Insufficient documentation

## 2020-02-14 DIAGNOSIS — R002 Palpitations: Secondary | ICD-10-CM | POA: Insufficient documentation

## 2020-02-14 DIAGNOSIS — Z6832 Body mass index (BMI) 32.0-32.9, adult: Secondary | ICD-10-CM | POA: Insufficient documentation

## 2020-02-14 DIAGNOSIS — Z808 Family history of malignant neoplasm of other organs or systems: Secondary | ICD-10-CM | POA: Diagnosis not present

## 2020-02-14 DIAGNOSIS — Z823 Family history of stroke: Secondary | ICD-10-CM | POA: Insufficient documentation

## 2020-02-14 HISTORY — PX: RECTAL EXAM UNDER ANESTHESIA: SHX6399

## 2020-02-14 HISTORY — PX: FISTULOTOMY: SHX6413

## 2020-02-14 LAB — POCT I-STAT, CHEM 8
BUN: 16 mg/dL (ref 6–20)
Calcium, Ion: 1.25 mmol/L (ref 1.15–1.40)
Chloride: 104 mmol/L (ref 98–111)
Creatinine, Ser: 1.1 mg/dL (ref 0.61–1.24)
Glucose, Bld: 97 mg/dL (ref 70–99)
HCT: 43 % (ref 39.0–52.0)
Hemoglobin: 14.6 g/dL (ref 13.0–17.0)
Potassium: 4.9 mmol/L (ref 3.5–5.1)
Sodium: 142 mmol/L (ref 135–145)
TCO2: 30 mmol/L (ref 22–32)

## 2020-02-14 SURGERY — EXAM UNDER ANESTHESIA, RECTUM
Anesthesia: General | Site: Rectum

## 2020-02-14 MED ORDER — FLEET ENEMA 7-19 GM/118ML RE ENEM
1.0000 | ENEMA | Freq: Once | RECTAL | Status: DC
  Filled 2020-02-14: qty 1

## 2020-02-14 MED ORDER — DEXAMETHASONE SODIUM PHOSPHATE 10 MG/ML IJ SOLN
INTRAMUSCULAR | Status: AC
  Filled 2020-02-14: qty 1

## 2020-02-14 MED ORDER — PROPOFOL 10 MG/ML IV BOLUS
INTRAVENOUS | Status: AC
  Filled 2020-02-14: qty 20

## 2020-02-14 MED ORDER — FENTANYL CITRATE (PF) 100 MCG/2ML IJ SOLN
INTRAMUSCULAR | Status: AC
  Filled 2020-02-14: qty 2

## 2020-02-14 MED ORDER — ONDANSETRON HCL 4 MG/2ML IJ SOLN
INTRAMUSCULAR | Status: AC
  Filled 2020-02-14: qty 2

## 2020-02-14 MED ORDER — CHLORHEXIDINE GLUCONATE CLOTH 2 % EX PADS
6.0000 | MEDICATED_PAD | Freq: Once | CUTANEOUS | Status: DC
  Filled 2020-02-14: qty 6

## 2020-02-14 MED ORDER — FENTANYL CITRATE (PF) 100 MCG/2ML IJ SOLN
25.0000 ug | INTRAMUSCULAR | Status: DC | PRN
  Filled 2020-02-14: qty 1

## 2020-02-14 MED ORDER — BUPIVACAINE LIPOSOME 1.3 % IJ SUSP
20.0000 mL | Freq: Once | INTRAMUSCULAR | Status: DC
  Filled 2020-02-14: qty 20

## 2020-02-14 MED ORDER — DEXAMETHASONE SODIUM PHOSPHATE 10 MG/ML IJ SOLN
INTRAMUSCULAR | Status: DC | PRN
  Administered 2020-02-14 (×2): 5 mg via INTRAVENOUS

## 2020-02-14 MED ORDER — SUGAMMADEX SODIUM 500 MG/5ML IV SOLN
INTRAVENOUS | Status: AC
  Filled 2020-02-14: qty 5

## 2020-02-14 MED ORDER — TRAMADOL HCL 50 MG PO TABS: 50.0000 mg | ORAL_TABLET | Freq: Four times a day (QID) | ORAL | 0 refills | Status: AC | PRN

## 2020-02-14 MED ORDER — LIDOCAINE 2% (20 MG/ML) 5 ML SYRINGE
INTRAMUSCULAR | Status: DC | PRN
  Administered 2020-02-14: 80 mg via INTRAVENOUS

## 2020-02-14 MED ORDER — PROMETHAZINE HCL 25 MG/ML IJ SOLN
6.2500 mg | INTRAMUSCULAR | Status: DC | PRN
  Filled 2020-02-14: qty 1

## 2020-02-14 MED ORDER — SUGAMMADEX SODIUM 200 MG/2ML IV SOLN
INTRAVENOUS | Status: DC | PRN
  Administered 2020-02-14: 230 mg via INTRAVENOUS

## 2020-02-14 MED ORDER — LIDOCAINE 2% (20 MG/ML) 5 ML SYRINGE
INTRAMUSCULAR | Status: AC
  Filled 2020-02-14: qty 5

## 2020-02-14 MED ORDER — OXYCODONE HCL 5 MG PO TABS
5.0000 mg | ORAL_TABLET | Freq: Once | ORAL | Status: DC | PRN
  Filled 2020-02-14: qty 1

## 2020-02-14 MED ORDER — ROCURONIUM BROMIDE 10 MG/ML (PF) SYRINGE
PREFILLED_SYRINGE | INTRAVENOUS | Status: AC
  Filled 2020-02-14: qty 10

## 2020-02-14 MED ORDER — FENTANYL CITRATE (PF) 100 MCG/2ML IJ SOLN
INTRAMUSCULAR | Status: DC | PRN
  Administered 2020-02-14 (×2): 50 ug via INTRAVENOUS

## 2020-02-14 MED ORDER — MIDAZOLAM HCL 2 MG/2ML IJ SOLN
INTRAMUSCULAR | Status: DC | PRN
  Administered 2020-02-14: 2 mg via INTRAVENOUS

## 2020-02-14 MED ORDER — ACETAMINOPHEN 500 MG PO TABS
ORAL_TABLET | ORAL | Status: AC
  Filled 2020-02-14: qty 2

## 2020-02-14 MED ORDER — ACETAMINOPHEN 500 MG PO TABS
1000.0000 mg | ORAL_TABLET | ORAL | Status: AC
  Administered 2020-02-14: 1000 mg via ORAL
  Filled 2020-02-14: qty 2

## 2020-02-14 MED ORDER — MIDAZOLAM HCL 2 MG/2ML IJ SOLN
INTRAMUSCULAR | Status: AC
  Filled 2020-02-14: qty 2

## 2020-02-14 MED ORDER — OXYCODONE HCL 5 MG/5ML PO SOLN
5.0000 mg | Freq: Once | ORAL | Status: DC | PRN
  Filled 2020-02-14: qty 5

## 2020-02-14 MED ORDER — BUPIVACAINE-EPINEPHRINE 0.25% -1:200000 IJ SOLN
INTRAMUSCULAR | Status: DC | PRN
  Administered 2020-02-14: 12 mL

## 2020-02-14 MED ORDER — ONDANSETRON HCL 4 MG/2ML IJ SOLN
INTRAMUSCULAR | Status: DC | PRN
  Administered 2020-02-14: 4 mg via INTRAVENOUS

## 2020-02-14 MED ORDER — ROCURONIUM BROMIDE 10 MG/ML (PF) SYRINGE
PREFILLED_SYRINGE | INTRAVENOUS | Status: DC | PRN
  Administered 2020-02-14: 50 mg via INTRAVENOUS

## 2020-02-14 MED ORDER — DIBUCAINE (PERIANAL) 1 % EX OINT
TOPICAL_OINTMENT | CUTANEOUS | Status: DC | PRN
  Administered 2020-02-14: 1 via RECTAL

## 2020-02-14 MED ORDER — LACTATED RINGERS IV SOLN
INTRAVENOUS | Status: DC
  Filled 2020-02-14: qty 1000

## 2020-02-14 MED ORDER — BUPIVACAINE LIPOSOME 1.3 % IJ SUSP
INTRAMUSCULAR | Status: DC | PRN
  Administered 2020-02-14: 8 mL

## 2020-02-14 MED ORDER — PROPOFOL 10 MG/ML IV BOLUS
INTRAVENOUS | Status: DC | PRN
  Administered 2020-02-14: 200 mg via INTRAVENOUS

## 2020-02-14 SURGICAL SUPPLY — 36 items
APL SKNCLS STERI-STRIP NONHPOA (GAUZE/BANDAGES/DRESSINGS) ×1
BENZOIN TINCTURE PRP APPL 2/3 (GAUZE/BANDAGES/DRESSINGS) ×3 IMPLANT
BLADE HEX COATED 2.75 (ELECTRODE) ×2 IMPLANT
BLADE SURG 15 STRL LF DISP TIS (BLADE) ×1 IMPLANT
BLADE SURG 15 STRL SS (BLADE) ×2
BRIEF STRETCH FOR OB PAD LRG (UNDERPADS AND DIAPERS) ×2 IMPLANT
CANISTER SUCT 3000ML PPV (MISCELLANEOUS) ×2 IMPLANT
COVER BACK TABLE 60X90IN (DRAPES) ×2 IMPLANT
COVER MAYO STAND STRL (DRAPES) ×2 IMPLANT
COVER WAND RF STERILE (DRAPES) ×2 IMPLANT
DRAPE LAPAROTOMY 100X72 PEDS (DRAPES) ×2 IMPLANT
DRAPE UTILITY XL STRL (DRAPES) ×2 IMPLANT
DRSG PAD ABDOMINAL 8X10 ST (GAUZE/BANDAGES/DRESSINGS) ×2 IMPLANT
GAUZE SPONGE 4X4 12PLY STRL (GAUZE/BANDAGES/DRESSINGS) ×2 IMPLANT
GLOVE BIO SURGEON STRL SZ 6 (GLOVE) ×1 IMPLANT
GLOVE BIO SURGEON STRL SZ 6.5 (GLOVE) ×1 IMPLANT
GLOVE BIO SURGEON STRL SZ7.5 (GLOVE) ×2 IMPLANT
GLOVE BIOGEL PI IND STRL 6 (GLOVE) IMPLANT
GLOVE BIOGEL PI IND STRL 6.5 (GLOVE) IMPLANT
GLOVE BIOGEL PI INDICATOR 6 (GLOVE) ×1
GLOVE BIOGEL PI INDICATOR 6.5 (GLOVE) ×1
GLOVE INDICATOR 8.0 STRL GRN (GLOVE) ×2 IMPLANT
GOWN STRL REUS W/ TWL XL LVL3 (GOWN DISPOSABLE) ×1 IMPLANT
GOWN STRL REUS W/TWL LRG LVL3 (GOWN DISPOSABLE) ×2 IMPLANT
GOWN STRL REUS W/TWL XL LVL3 (GOWN DISPOSABLE) ×2
KIT TURNOVER CYSTO (KITS) ×2 IMPLANT
NEEDLE HYPO 22GX1.5 SAFETY (NEEDLE) ×2 IMPLANT
NS IRRIG 500ML POUR BTL (IV SOLUTION) ×2 IMPLANT
PACK BASIN DAY SURGERY FS (CUSTOM PROCEDURE TRAY) ×2 IMPLANT
PENCIL BUTTON HOLSTER BLD 10FT (ELECTRODE) ×2 IMPLANT
SYR BULB IRRIGATION 50ML (SYRINGE) ×2 IMPLANT
SYR CONTROL 10ML LL (SYRINGE) ×2 IMPLANT
TOWEL OR 17X26 10 PK STRL BLUE (TOWEL DISPOSABLE) ×2 IMPLANT
TRAY DSU PREP LF (CUSTOM PROCEDURE TRAY) ×2 IMPLANT
TUBE CONNECTING 12X1/4 (SUCTIONS) ×2 IMPLANT
YANKAUER SUCT BULB TIP NO VENT (SUCTIONS) ×2 IMPLANT

## 2020-02-14 NOTE — Discharge Instructions (Addendum)
ANORECTAL SURGERY: POST OP INSTRUCTIONS  1. DIET: Follow a light bland diet the first 24 hours after arrival home, such as soup, liquids, crackers, etc.  Be sure to include lots of fluids daily.  Avoid fast food or heavy meals as your are more likely to get nauseated.  Eat a low fat diet the next few days after surgery.    2. Take your usually prescribed home medications unless otherwise directed.  3. PAIN CONTROL: a. It is helpful to take an over-the-counter pain medication regularly for the first few days/weeks.  Choose from the following that works best for you: i. Ibuprofen (Advil, etc) Three 200mg tabs every 6 hours as needed. ii. Acetaminophen (Tylenol, etc) 500-650mg every 6 hours as needed iii. NOTE: You may take both of these medications together - most patients find it most helpful when alternating between the two (i.e. Ibuprofen at 6am, tylenol at 9am, ibuprofen at 12pm ...) b. A  prescription for pain medication may have been prescribed for you at discharge.  Take your pain medication as prescribed.  i. If you are having problems/concerns with the prescription medicine, please call us for further advice.  4. Avoid getting constipated.  Between the surgery and the pain medications, it is common to experience some constipation.  Increasing fluid intake (64oz of water per day) and taking a fiber supplement (such as Metamucil, Citrucel, FiberCon) 1-2 times a day regularly will usually help prevent this problem from occurring.  Take Miralax (over the counter) 1-2x/day while taking a narcotic pain medication. If no bowel movement after 48hours, you may additionally take a laxative like a bottle of Milk of Magnesia which can be purchased over the counter. Avoid enemas if possible as these are often painful.   5. Watch out for diarrhea.  If you have many loose bowel movements, simplify your diet to bland foods.  Stop any stool softeners and decrease your fiber supplement. If this worsens or does  not improve, please call us.  6. Wash / shower every day.  If you were discharged with a dressing, you may remove this the day after your surgery. You may shower normally, getting soap/water on your wound, particularly after bowel movements.  7. Soaking in a warm bath filled a couple inches ("Sitz bath") is a great way to clean the area after a bowel movement and many patients find it is a way to soothe the area.  8. ACTIVITIES as tolerated:   a. You may resume regular (light) daily activities beginning the next day--such as daily self-care, walking, climbing stairs--gradually increasing activities as tolerated.  If you can walk 30 minutes without difficulty, it is safe to try more intense activity such as jogging, treadmill, bicycling, low-impact aerobics, etc. b. Refrain from any heavy lifting or straining for the first 2 weeks after your procedure, particularly if your surgery was for hemorrhoids. c. Avoid activities that make your pain worse d. You may drive when you are no longer taking prescription pain medication, you can comfortably wear a seatbelt, and you can safely maneuver your car and apply brakes.  9. FOLLOW UP in our office a. Please call [facility] at [phone] to set up an appointment to see your surgeon in the office for a follow-up appointment approximately 2 weeks after your surgery. b. Make sure that you call for this appointment the day you arrive home to insure a convenient appointment time.  9. If you have disability or family leave forms that need to be completed,   you may have them completed by your primary care physician's office; for return to work instructions, please ask our office staff and they will be happy to assist you in obtaining this documentation   When to call us [phone]: 1. Poor pain control 2. Reactions / problems with new medications (rash/itching, etc)  3. Fever over 101.5 F (38.5 C) 4. Inability to urinate 5. Nausea/vomiting 6. Worsening  swelling or bruising 7. Continued bleeding from incision. 8. Increased pain, redness, or drainage from the incision  The clinic staff is available to answer your questions during regular business hours (8:30am-5pm).  Please don't hesitate to call and ask to speak to one of our nurses for clinical concerns.   A surgeon from [facility] is always on call at the hospitals   If you have a medical emergency, go to the nearest emergency room or call 911.   [facility] [address], [place], [place]  [address] ? MAIN: [phone] FAX [phone] [email or web address] Post Anesthesia Home Care Instructions  Activity: Get plenty of rest for the remainder of the day. A responsible adult should stay with you for 24 hours following the procedure.  For the next 24 hours, DO NOT: -Drive a car -Advertising copywriter -Drink alcoholic beverages -Take any medication unless instructed by your physician -Make any legal decisions or sign important papers.  Meals: Start with liquid foods such as gelatin or soup. Progress to regular foods as tolerated. Avoid greasy, spicy, heavy foods. If nausea and/or vomiting occur, drink only clear liquids until the nausea and/or vomiting subsides. Call your physician if vomiting continues.  Special Instructions/Symptoms: Your throat may feel dry or sore from the anesthesia or the breathing tube placed in your throat during surgery. If this causes discomfort, gargle with warm salt water. The discomfort should disappear within 24 hours.  If you had a scopolamine patch placed behind your ear for the management of post- operative nausea and/or vomiting:  1. The medication in the patch is effective for 72 hours, after which it should be removed.  Wrap patch in a tissue and discard in the trash. Wash hands thoroughly with soap and water. 2. You may remove the patch earlier than 72 hours if you experience unpleasant  side effects which may include dry mouth, dizziness or visual disturbances. 3. Avoid touching the patch. Wash your hands with soap and water after contact with the patch.   Information for Discharge Teaching: EXPAREL (bupivacaine liposome injectable suspension)   Your surgeon gave you EXPAREL(bupivacaine) in your surgical incision to help control your pain after surgery.   EXPAREL is a local anesthetic that provides pain relief by numbing the tissue around the surgical site.  EXPAREL is designed to release pain medication over time and can control pain for up to 72 hours.  Depending on how you respond to EXPAREL, you may require less pain medication during your recovery.  Possible side effects:  Temporary loss of sensation or ability to move in the area where bupivacaine was injected.  Nausea, vomiting, constipation  Rarely, numbness and tingling in your mouth or lips, lightheadedness, or anxiety may occur.  Call your doctor right away if you think you may be experiencing any of these sensations, or if you have other questions regarding possible side effects.  Follow all other discharge instructions given to you by your surgeon or nurse. Eat a healthy diet and drink plenty of water or other fluids.  If you return  to the hospital for any reason within 96 hours following the administration of EXPAREL, please inform your health care providers.

## 2020-02-14 NOTE — Op Note (Signed)
02/14/2020  9:52 AM  PATIENT:  Martin Rose  44 y.o. 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 Incision and drainage of ~5-10 mm perianal fluid collection 2. Anorectal exam under anesthesia  SURGEON:  Surgeon(s): Andria Meuse, MD  ASSISTANT: OR staff   ANESTHESIA:   local and general  SPECIMEN:  No Specimen  DISPOSITION OF SPECIMEN:  N/A  COUNTS:  Sponge, needle, and instrument counts were reported correct x2 at conclusion.  EBL: 1 mL  Drains: None  PLAN OF CARE: Discharge to home after PACU  PATIENT DISPOSITION:  PACU - hemodynamically stable.  INDICATION: Mr. 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  Ultimately, had undergone partial fistulotomy and cutting seton placement.  This subsequently worked its way out and he had been doing well.  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  He was seen back in the office for long-term follow-up pretense of intermittent pains in the perianal area on the left anterior lateral site.  He had had a small amount of purulent discharge.  We examined him and given his symptoms and history, concern he could have a small short or recurrent fistula.  We discussed further options moving forward.  He has had a fairly difficult in office exam due to his habitus.  We discussed proceeding with anorectal exam under anesthesia, possible fistulotomy, possible seton all based on intraoperative findings.  Please refer to notes elsewhere for details regarding discussion.  OR FINDINGS: Scar in the left anterior lateral perianal region at the location  of his prior surgery.  No active posterior midline fissure or recurrent fistula at the site.  Small skin dimpling in the left lateral perianal skin which was examined and attempted to be probed but did not communicate anywhere.  This does not appear to be an active fistula or external opening.  Circumferential anoscopy demonstrated a normal-appearing anal canal without apparent internal openings.  The crypt hook was carefully passed around the anal canal and an attempt to identify any sort of occult internal opening and none were found.  There is no perianal fluctuance, skin changes.  He did have an approximate 5 to 10 mm area of firm indurated tissue in the left lateral perianal skin which was incised and explored and only found to contain a small amount of clear fluid.  No abscess.  DESCRIPTION: The patient was identified in the preoperative holding area and taken to the OR where he was taken to the operating room and had SCDs placed.  He underwent general endotracheal anesthesia and then was rolled into the prone jackknife position on the operating table.  The buttocks were gently taped apart.  He was then prepped and draped in usual sterile fashion.  A surgical timeout was performed indicating the correct patient, procedure, positioning and need for preoperative antibiotics.  A perianal block was performed using Exparel + Marcaine with epinephrine.    Externally, normal-appearing perianal skin.  There was a dimpling in the left anterior lateral skin consistent with his prior history.  There was firmness at this location but no clear fluctuance or induration/erythema.  A  well lubricated digital rectal exam was performed which demonstrated no palpable masses or abnormalities.  A Hill-Ferguson anoscope was into the anal canal and circumferential inspection demonstrated normal-appearing anoderm without ulcerations, granulation, or fissures.  The posterior midline fistulotomy site that he had previously is all  well-healed.  A fistula probe was carefully placed at the prior external opening in the skin here is all well-healed and closed.  There was no fistula or tract palpable either.  Given his symptoms and findings, a careful and meticulous anorectal exam was carried out.  A crypt hook was used to carefully probed the anal canal and crypts at the dentate and no occult internal opening could be identified.  There was some scarring in the anterior lateral anal canal consistent with his history but no open wounds, fluctuance, or purulent drainage.  Because of his symptoms and firmness in the left anterolateral perianal skin, a small 1 cm incision was made and the subcutaneous tissue dissected bluntly.  There was a small amount of clear serous fluid which was evacuated but no purulent fluid or abscess.  No other source for his symptoms have been identified.  Dressing consisting of 4 x 4 gauze, ABD, and mesh underwear was placed.  He was then rolled back into the supine position on a stretcher, awakened from anesthesia, extubated and transferred to recovery in satisfactory condition.  DISPOSITION: PACU in satisfactory condition.

## 2020-02-14 NOTE — Anesthesia Preprocedure Evaluation (Signed)
Anesthesia Evaluation  Patient identified by MRN, date of birth, ID band Patient awake    Reviewed: Allergy & Precautions, NPO status , Patient's Chart, lab work & pertinent test results  Airway Mallampati: II  TM Distance: >3 FB Neck ROM: Full    Dental no notable dental hx. (+) Teeth Intact   Pulmonary former smoker,    Pulmonary exam normal breath sounds clear to auscultation       Cardiovascular hypertension, Pt. on medications and Pt. 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   Endo/Other  Morbid obesity  Renal/GU   negative genitourinary   Musculoskeletal negative musculoskeletal ROS (+)   Abdominal (+) + obese,   Peds  Hematology negative hematology ROS (+)   Anesthesia Other Findings   Reproductive/Obstetrics                             Anesthesia Physical Anesthesia Plan  ASA: III  Anesthesia Plan: General   Post-op Pain Management:    Induction: Intravenous  PONV Risk Score and Plan: 4 or greater and Ondansetron, Dexamethasone, Midazolam and Treatment may vary due to age or medical condition  Airway Management Planned: Oral ETT  Additional Equipment:   Intra-op Plan:   Post-operative Plan: Extubation in OR  Informed Consent: I have reviewed the patients History and Physical, chart, labs and discussed the procedure including the risks, benefits and alternatives for the proposed anesthesia with the patient or authorized representative who has indicated his/her understanding and acceptance.     Dental advisory given  Plan Discussed with: CRNA, Surgeon and Anesthesiologist  Anesthesia Plan Comments:         Anesthesia Quick Evaluation

## 2020-02-14 NOTE — Transfer of Care (Signed)
Immediate Anesthesia Transfer of Care Note  Patient: Martin Rose  Procedure(s) Performed: Procedure(s) (LRB): ANORECTAL EXAM UNDER ANESTHESIA (N/A) INCISION AND DRAINAGE OF PERIANAL COLLECTION (N/A)  Patient Location: PACU  Anesthesia Type: General  Level of Consciousness: awake, oriented, sedated and patient cooperative  Airway & Oxygen Therapy: Patient Spontanous Breathing and Patient connected to face mask oxygen  Post-op Assessment: Report given to PACU RN and Post -op Vital signs reviewed and stable  Post vital signs: Reviewed and stable  Complications: No apparent anesthesia complications Last Vitals:  Vitals Value Taken Time  BP 145/101 02/14/20 1130  Temp 36.7 C 02/14/20 1130  Pulse 53 02/14/20 1130  Resp 12 02/14/20 1130  SpO2 99 % 02/14/20 1130    Last Pain:  Vitals:   02/14/20 1115  TempSrc:   PainSc: 0-No pain      Patients Stated Pain Goal: 4 (02/14/20 0837)

## 2020-02-14 NOTE — Anesthesia Procedure Notes (Signed)
Procedure Name: Intubation Date/Time: 02/14/2020 9:10 AM Performed by: Suan Halter, CRNA Pre-anesthesia Checklist: Patient identified, Emergency Drugs available, Suction available and Patient being monitored Patient Re-evaluated:Patient Re-evaluated prior to induction Oxygen Delivery Method: Circle system utilized Preoxygenation: Pre-oxygenation with 100% oxygen Induction Type: IV induction Ventilation: Mask ventilation without difficulty Laryngoscope Size: Mac and 3 Grade View: Grade I Tube type: Oral Tube size: 7.5 mm Number of attempts: 1 Airway Equipment and Method: Stylet and Oral airway Placement Confirmation: ETT inserted through vocal cords under direct vision,  positive ETCO2 and breath sounds checked- equal and bilateral Secured at: 23 cm Tube secured with: Tape Dental Injury: Teeth and Oropharynx as per pre-operative assessment

## 2020-02-14 NOTE — Anesthesia Postprocedure Evaluation (Signed)
Anesthesia Post Note  Patient: Martin Rose  Procedure(s) Performed: ANORECTAL EXAM UNDER ANESTHESIA (N/A Rectum) INCISION AND DRAINAGE OF PERIANAL COLLECTION (N/A Rectum)     Patient location during evaluation: PACU Anesthesia Type: General Level of consciousness: awake and alert and oriented Pain management: pain level controlled Vital Signs Assessment: post-procedure vital signs reviewed and stable Respiratory status: spontaneous breathing, nonlabored ventilation and respiratory function stable Cardiovascular status: blood pressure returned to baseline and stable Postop Assessment: no apparent nausea or vomiting Anesthetic complications: no    Last Vitals:  Vitals:   02/14/20 1015 02/14/20 1030  BP: 127/81 130/82  Pulse: 63   Resp: 20 16  Temp:    SpO2: 97%     Last Pain:  Vitals:   02/14/20 1045  TempSrc:   PainSc: 0-No pain                 Geo Slone A.

## 2020-02-14 NOTE — H&P (Signed)
CC: Here today for surgery  HPI: Martin Rose is a very pelasant 42yoM with hx of HTN who underwent I&D of perianal abscess on 03/22/18 by Dr. Lindie Spruce. He was lost to follow-up after this but return to the office in early July 2019 for evaluation of a draining wound of the perianal area. He was seen in the office and offered EUA/possible fistulotomy/possible seton; he was in the process of scheduling when an abscess recurred  He presented to [facility] in the middle of August with a recurrent perianal abscess. He was taken the operative by Dr. Lindie Spruce for incision and drainage and was found to have a large perianal abscess in the left perianal area. He recovered well from this and was discharged home. He was seen in the office for follow-up.   He denied any history of incontinence to solid stool, liquid stool, nor gas.  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 He developed significant pain in the perianal area about 10-12 days ago. He is able to sit and having a lot of pain. He underwent CT scan to rule out abscess and none was found. After getting his pain control we were able to examine him better and appeared that the seton had become more snug likely due to body habitus and was pinching some of the perianal skin.  OR 11/20/18 - partial fistulotomy and placement of cuting seton (found to have intersphincteric fistula which appeared to only involve external sphincter muscle). Seton subsequently migrated out on his own approximately 4 weeks later. He recovered well from all this. He was lost to follow-up. He returns today after having been to the emergency room last week with recurrent left-sided perianal discomfort and is spontaneously draining gluteal abscess. He was given  antibiotics and discharged. CT pelvis demonstrated no obvious abscess. He returns today for follow-up. He reports doing much better over the last 5 days. He denies any fever/chills. He reports the gluteal pain he had has been improving. He reports scant drainage from the abscess cavity that open.  He denies any issues with incontinence to gas, liquid or solid stool  He has been doing well since I saw in the office, had complete resolution of all the symptoms including pain. We had discussed proceeding with EUA and his last office visit that he ultimately declined to schedule. He had persistent drainage at that point in time but that had resolved. He had returned to work and even doing long drives on the road to [place]. Over the last couple weeks she's had some intermittent pains in the perianal area at the exact area where he had his previous fistula. He's had a small amount of purulent discharge from where his previous seton was. He denies any active pending this time, fever/chills. The drainage has subsided.  INTERVAL HX He denies any changes in his health or health history - still with intermittent drainage from wound that opens/closes. States he is ready for surgery  PMH: Hypertension (well controlled on oral antihypertensive)  PSH: As stated above  FHx: Denies FHx of malignancy  Social: Denies use of tobacco/EtOH/drugs  ROS: A comprehensive 10 system review of systems was completed with the patient and pertinent findings as noted above.  Past Medical History:  Diagnosis Date  . History of rectal abscess   .  Hypertension   . Palpitations 07/11/2017   per pt only feels when he over exerts himself without sob  . Perirectal fistula    wound/  left anterior    Past Surgical History:  Procedure Laterality Date  . ANAL FISTULOTOMY N/A 11/20/2018   Procedure: FISTULOTOMY placement cutting seton;  Surgeon: Andria Meuse, MD;  Location: [facility];  Service:  General;  Laterality: N/A;  . CARDIAC CATHETERIZATION  07-20-2006   dr Jenne Campus   abnormal cardiolite--  no sig. cad, normal LVF, ef 60%, systemic HTN, elevated LVEDP (22)  . FISTULOTOMY N/A 09/11/2018   Procedure: FISTULOTOMY;  Surgeon: Andria Meuse, MD;  Location: [facility];  Service: General;  Laterality: N/A;  . INCISION AND DRAINAGE PERIRECTAL ABSCESS Left 03/22/2018   Procedure: IRRIGATION AND DEBRIDEMENT PERIRECTAL ABSCESS;  Surgeon: Jimmye Norman, MD;  Location: [facility] OR;  Service: General;  Laterality: Left;  . INCISION AND DRAINAGE PERIRECTAL ABSCESS N/A 07/31/2018   Procedure: IRRIGATION AND DEBRIDEMENT PERIRECTAL ABSCESS;  Surgeon: Jimmye Norman, MD;  Location: [facility] OR;  Service: General;  Laterality: N/A;  . PLACEMENT OF SETON N/A 09/11/2018   Procedure: PLACEMENT OF SETON;  Surgeon: Andria Meuse, MD;  Location: [facility];  Service: General;  Laterality: N/A;  . RECTAL EXAM UNDER ANESTHESIA N/A 11/20/2018   Procedure: ANORECTAL EXAM UNDER ANESTHESIA;  Surgeon: Andria Meuse, MD;  Location: [facility];  Service: General;  Laterality: N/A;  . TRANSTHORACIC ECHOCARDIOGRAM  11/29/2011   moderate LVH, ef 55-60%,  grade 2 diastolic dysfunction/  trivial MR and TR/  mild LAE    Family History  Problem Relation Age of Onset  . Hypertension Mother   . Hypertension Father   . Cancer Sister 30       brain tumor  . Stroke Sister     Social:  reports that he quit smoking about 11 years ago. His smoking use included cigarettes. He has a 7.50 pack-year smoking history. He has never used smokeless tobacco. He reports current alcohol use of about 24.0 standard drinks of alcohol per week. He reports current drug use. Drug: Marijuana.  Allergies: No Known Allergies  Medications: I have reviewed the patient's current medications.  No results found for this or any previous visit (from the past 48 hour(s)).  No results found.  ROS - all of the below  systems have been reviewed with the patient and positives are indicated with bold text General: chills, fever or night sweats Eyes: blurry vision or double vision ENT: epistaxis or sore throat Allergy/Immunology: itchy/watery eyes or nasal congestion Hematologic/Lymphatic: bleeding problems, blood clots or swollen lymph nodes Endocrine: temperature intolerance or unexpected weight changes Breast: new or changing breast lumps or nipple discharge Resp: cough, shortness of breath, or wheezing CV: chest pain or dyspnea on exertion GI: as per HPI GU: dysuria, trouble voiding, or hematuria MSK: joint pain or joint stiffness Neuro: TIA or stroke symptoms Derm: pruritus and skin lesion changes Psych: anxiety and depression  PE Height 6' (1.829 m), weight 127 kg. Constitutional: NAD; conversant; no deformities Eyes: Moist conjunctiva; no lid lag; anicteric; PERRL Neck: Trachea midline; no thyromegaly Lungs: Normal respiratory effort; no tactile fremitus CV: RRR; no palpable thrills; no pitting edema GI: Abd soft, NT/ND; no palpable hepatosplenomegaly MSK: Normal range of motion of extremities; no clubbing/cyanosis Psychiatric: Appropriate affect; alert and oriented x3 Lymphatic: No palpable cervical or axillary lymphadenopathy  No results found for this or any previous visit (from the past  48 hour(s)).  No results found.   A/P: Martin Rose is a very pleasant 84yoM with hx of HTN- now s/p seton followed by EUA/cutting seton 11/2018 - here for surgery  -We discussed this now could represent a chronic sinus/cavity versus a recurrence of his fistula. We had discussed further observation versus examination under anesthesia, possible incision/drainage, possible fistulotomy possible seton. -The anatomy and physiology of the anal canal was discussed at length with the patient. The pathophysiology of anal abscess and fistula was discussed at length as well today -The planned procedures,  material risks (including, but not limited to, pain, bleeding, infection, scarring, need for blood transfusion, damage to anal sphincter, incontinence of gas and/or stool, need for additional procedures, recurrence, pneumonia, heart attack, stroke, death) benefits and alternatives to surgery were discussed at length. The patient's questions were answered to his satisfaction, he voiced understanding and after considering all options, has elected to proceed with surgery. Additionally, we discussed typical postoperative expectations and the recovery process.  Sharon Mt. Dema Severin, M.D. [facility] Use [email or web address] to contact on call provider

## 2020-03-13 ENCOUNTER — Ambulatory Visit: Payer: 59 | Admitting: Family Medicine

## 2020-05-28 ENCOUNTER — Encounter (HOSPITAL_COMMUNITY): Payer: Self-pay | Admitting: Emergency Medicine

## 2020-05-28 ENCOUNTER — Other Ambulatory Visit: Payer: Self-pay

## 2020-05-28 ENCOUNTER — Emergency Department (HOSPITAL_COMMUNITY): Payer: 59

## 2020-05-28 ENCOUNTER — Emergency Department (HOSPITAL_COMMUNITY)
Admission: EM | Admit: 2020-05-28 | Discharge: 2020-05-28 | Disposition: A | Discharge status: Home / Self Care | Payer: 59 | Attending: Emergency Medicine | Admitting: Emergency Medicine

## 2020-05-28 DIAGNOSIS — K610 Anal abscess: Secondary | ICD-10-CM | POA: Diagnosis not present

## 2020-05-28 DIAGNOSIS — Z79899 Other long term (current) drug therapy: Secondary | ICD-10-CM | POA: Diagnosis not present

## 2020-05-28 DIAGNOSIS — Z87891 Personal history of nicotine dependence: Secondary | ICD-10-CM | POA: Diagnosis not present

## 2020-05-28 DIAGNOSIS — K6289 Other specified diseases of anus and rectum: Secondary | ICD-10-CM | POA: Diagnosis present

## 2020-05-28 DIAGNOSIS — I10 Essential (primary) hypertension: Secondary | ICD-10-CM | POA: Diagnosis not present

## 2020-05-28 LAB — COMPREHENSIVE METABOLIC PANEL
ALT: 27 U/L (ref 0–44)
AST: 35 U/L (ref 15–41)
Albumin: 3.6 g/dL (ref 3.5–5.0)
Alkaline Phosphatase: 86 U/L (ref 38–126)
Anion gap: 9 (ref 5–15)
BUN: 15 mg/dL (ref 6–20)
CO2: 25 mmol/L (ref 22–32)
Calcium: 8.7 mg/dL — ABNORMAL LOW (ref 8.9–10.3)
Chloride: 105 mmol/L (ref 98–111)
Creatinine, Ser: 1.14 mg/dL (ref 0.61–1.24)
GFR calc Af Amer: >60 mL/min (ref >60)
GFR calc non Af Amer: >60 mL/min (ref >60)
Glucose, Bld: 93 mg/dL (ref 70–99)
Potassium: 7 mmol/L (ref 3.5–5.1)
Sodium: 139 mmol/L (ref 135–145)
Total Bilirubin: 1.1 mg/dL (ref 0.3–1.2)
Total Protein: 7.7 g/dL (ref 6.5–8.1)

## 2020-05-28 LAB — I-STAT CHEM 8, ED
BUN: 18 mg/dL (ref 6–20)
BUN: 13 mg/dL (ref 6–20)
Calcium, Ion: 1.08 mmol/L — ABNORMAL LOW (ref 1.15–1.40)
Calcium, Ion: 1.06 mmol/L — ABNORMAL LOW (ref 1.15–1.40)
Chloride: 105 mmol/L (ref 98–111)
Chloride: 103 mmol/L (ref 98–111)
Creatinine, Ser: 1 mg/dL (ref 0.61–1.24)
Creatinine, Ser: 1.1 mg/dL (ref 0.61–1.24)
Glucose, Bld: 96 mg/dL (ref 70–99)
Glucose, Bld: 89 mg/dL (ref 70–99)
HCT: 43 % (ref 39.0–52.0)
HCT: 39 % (ref 39.0–52.0)
Hemoglobin: 14.6 g/dL (ref 13.0–17.0)
Hemoglobin: 13.3 g/dL (ref 13.0–17.0)
Potassium: 5.3 mmol/L — ABNORMAL HIGH (ref 3.5–5.1)
Potassium: 3.8 mmol/L (ref 3.5–5.1)
Sodium: 141 mmol/L (ref 135–145)
Sodium: 141 mmol/L (ref 135–145)
TCO2: 29 mmol/L (ref 22–32)
TCO2: 29 mmol/L (ref 22–32)

## 2020-05-28 LAB — CBC WITH DIFFERENTIAL/PLATELET
Abs Immature Granulocytes: 0.04 10*3/uL (ref 0.00–0.07)
Basophils Absolute: 0.1 10*3/uL (ref 0.0–0.1)
Basophils Relative: 0 %
Eosinophils Absolute: 0.3 10*3/uL (ref 0.0–0.5)
Eosinophils Relative: 3 %
HCT: 39.5 % (ref 39.0–52.0)
Hemoglobin: 12.8 g/dL — ABNORMAL LOW (ref 13.0–17.0)
Immature Granulocytes: 0 %
Lymphocytes Relative: 20 %
Lymphs Abs: 2.3 10*3/uL (ref 0.7–4.0)
MCH: 26.8 pg (ref 26.0–34.0)
MCHC: 32.4 g/dL (ref 30.0–36.0)
MCV: 82.6 fL (ref 80.0–100.0)
Monocytes Absolute: 1.1 10*3/uL — ABNORMAL HIGH (ref 0.1–1.0)
Monocytes Relative: 10 %
Neutro Abs: 7.7 10*3/uL (ref 1.7–7.7)
Neutrophils Relative %: 67 %
Platelets: 320 10*3/uL (ref 150–400)
RBC: 4.78 MIL/uL (ref 4.22–5.81)
RDW: 13.9 % (ref 11.5–15.5)
WBC: 11.5 10*3/uL — ABNORMAL HIGH (ref 4.0–10.5)
nRBC: 0 % (ref 0.0–0.2)

## 2020-05-28 MED ORDER — MORPHINE SULFATE (PF) 4 MG/ML IV SOLN
4.0000 mg | Freq: Once | INTRAVENOUS | Status: AC
  Administered 2020-05-28: 4 mg via INTRAVENOUS
  Filled 2020-05-28: qty 1

## 2020-05-28 MED ORDER — HYDROMORPHONE HCL 1 MG/ML IJ SOLN
1.0000 mg | Freq: Once | INTRAMUSCULAR | Status: AC
  Administered 2020-05-28: 1 mg via INTRAVENOUS
  Filled 2020-05-28: qty 1

## 2020-05-28 MED ORDER — HYDROCODONE-ACETAMINOPHEN 5-325 MG PO TABS: 1.0000 | ORAL_TABLET | Freq: Four times a day (QID) | ORAL | 0 refills | Status: DC | PRN

## 2020-05-28 MED ORDER — IOHEXOL 300 MG/ML  SOLN
100.0000 mL | Freq: Once | INTRAMUSCULAR | Status: AC | PRN
  Administered 2020-05-28: 100 mL via INTRAVENOUS

## 2020-05-28 MED ORDER — AMOXICILLIN-POT CLAVULANATE 875-125 MG PO TABS: 1.0000 | ORAL_TABLET | Freq: Two times a day (BID) | ORAL | 0 refills | Status: AC

## 2020-05-28 MED ORDER — CLINDAMYCIN PHOSPHATE 600 MG/50ML IV SOLN
600.0000 mg | Freq: Once | INTRAVENOUS | Status: AC
  Administered 2020-05-28: 600 mg via INTRAVENOUS
  Filled 2020-05-28: qty 50

## 2020-05-28 NOTE — ED Notes (Signed)
Notified EDP,Yao,MD., pt, potassium 5.3 and RN, Denyse Amass made aware.

## 2020-05-28 NOTE — Discharge Instructions (Signed)
Wonderful to see you today.  You were seen in the ED due to the inflammation on your bottom, likely another recurrence of a perianal abscess.  We have sent in antibiotics and some pain medicine to help, please make sure you call Dr. Cliffton Asters first thing tomorrow morning to schedule an appointment for Friday.  You can continue to use Tylenol/ibuprofen additionally for pain relief, please limit your Tylenol to 4000 mg maximum daily (noticed there is Tylenol in the Norco we sent as well).

## 2020-05-28 NOTE — ED Triage Notes (Signed)
Patient here from home reporting rectal abscess x2 days. Hx of same. Unable to "get in with surgery".

## 2020-05-28 NOTE — ED Provider Notes (Signed)
[facility] DEPT Provider Note   CSN: [id number] Arrival date & time: 05/28/20  1752     History Chief Complaint  Patient presents with  . Rectal Pain  . Abscess    Martin Rose is a 44 y.o. male, with a history of recurrent perianal abscesses requiring irrigation and drainage under anesthesia with fistulotomy numerous times (most recently in 02/2020), presenting for evaluation of perianal abscess.  Reports that he noted an abscess about 1 week ago on his left buttock.  He change pads frequently with sitz bath's and ended up draining on its own.  It seemed better until approximately 24 hours ago when he noticed substantial swelling and pain on his left buttock at the gluteal fold.  Has some pus/blood drainage.  Reports sharp, severe pains from this area.  He routinely follows Dr. Nadeen Landau, general surgery, however he was not able to get him in until Friday.  Last febrile 3 days ago.     Past Medical History:  Diagnosis Date  . History of rectal abscess   . Hypertension   . Palpitations 07/11/2017   per pt only feels when he over exerts himself without sob  . Perirectal fistula    wound/  left anterior    Patient Active Problem List   Diagnosis Date Noted  . Perianal abscess 07/30/2018  . Skin rash 05/22/2018  . Peri-rectal abscess 03/23/2018  . Rectal abscess 03/22/2018  . Perirectal abscess 03/22/2018  . Palpitations 07/11/2017  . Alcohol use 07/11/2017  . Allergic rhinitis 06/24/2015  . Morbid obesity ([place]) 04/30/2013  . Snoring 04/30/2013  . Hidradenitis 09/04/2012  . Essential hypertension, benign 03/10/2009    Past Surgical History:  Procedure Laterality Date  . ANAL FISTULOTOMY N/A 11/20/2018   Procedure: FISTULOTOMY placement cutting seton;  Surgeon: Ileana Roup, MD;  Location: [facility];  Service: General;  Laterality: N/A;  . CARDIAC CATHETERIZATION  07-20-2006   dr Tami Ribas   abnormal cardiolite--  no sig. cad,  normal LVF, ef 60%, systemic HTN, elevated LVEDP (22)  . FISTULOTOMY N/A 09/11/2018   Procedure: FISTULOTOMY;  Surgeon: Ileana Roup, MD;  Location: [facility];  Service: General;  Laterality: N/A;  . FISTULOTOMY N/A 02/14/2020   Procedure: INCISION AND DRAINAGE OF PERIANAL COLLECTION;  Surgeon: Ileana Roup, MD;  Location: [place];  Service: General;  Laterality: N/A;  . INCISION AND DRAINAGE PERIRECTAL ABSCESS Left 03/22/2018   Procedure: IRRIGATION AND DEBRIDEMENT PERIRECTAL ABSCESS;  Surgeon: Judeth Horn, MD;  Location: [facility];  Service: General;  Laterality: Left;  . INCISION AND DRAINAGE PERIRECTAL ABSCESS N/A 07/31/2018   Procedure: IRRIGATION AND DEBRIDEMENT PERIRECTAL ABSCESS;  Surgeon: Judeth Horn, MD;  Location: [place];  Service: General;  Laterality: N/A;  . [place] N/A 09/11/2018   Procedure: PLACEMENT OF SETON;  Surgeon: Ileana Roup, MD;  Location: [place];  Service: General;  Laterality: N/A;  . RECTAL EXAM UNDER ANESTHESIA N/A 11/20/2018   Procedure: ANORECTAL EXAM UNDER ANESTHESIA;  Surgeon: Ileana Roup, MD;  Location: [facility];  Service: General;  Laterality: N/A;  . RECTAL EXAM UNDER ANESTHESIA N/A 02/14/2020   Procedure: ANORECTAL EXAM UNDER ANESTHESIA;  Surgeon: Ileana Roup, MD;  Location: [place];  Service: General;  Laterality: N/A;  . TRANSTHORACIC ECHOCARDIOGRAM  11/29/2011   moderate LVH, ef 55-60%,  grade 2 diastolic dysfunction/  trivial MR and TR/  mild LAE  Family History  Problem Relation Age of Onset  . Hypertension Mother   . Hypertension Father   . Cancer Sister 30       brain tumor  . Stroke Sister     Social History   Tobacco Use  . Smoking status: Former Smoker    Packs/day: 0.50    Years: 15.00    Pack years: 7.50    Types: Cigarettes    Quit date: 2010    Years since quitting: 11.4  . Smokeless tobacco: Never Used    Vaping Use  . Vaping Use: Never used  Substance Use Topics  . Alcohol use: Yes    Alcohol/week: 24.0 standard drinks    Types: 24 Cans of beer per week    Comment: 2 beers a day  . Drug use: Yes    Types: Marijuana    Comment: 02-08-2020 "2- times/wk"    Home Medications Prior to Admission medications   Medication Sig Start Date End Date Taking? Authorizing Provider  amLODipine (NORVASC) 10 MG tablet TAKE 1 TABLET(10 MG) BY MOUTH EVERY MORNING Patient taking differently: Take 10 mg by mouth daily.  10/17/19  Yes Bland, Scott, DO  atenolol (TENORMIN) 50 MG tablet TAKE 1 TABLET(50 MG) BY MOUTH EVERY MORNING Patient taking differently: Take 50 mg by mouth daily.  10/17/19  Yes Bland, Scott, DO  ibuprofen (ADVIL,MOTRIN) 200 MG tablet Take 1-4 tablets (200-800 mg total) by mouth every 6 (six) hours as needed (for pain). Patient taking differently: Take 600-800 mg by mouth every 6 (six) hours as needed for headache or moderate pain.  08/02/18  Yes Barnetta Chapel, PA-C  lidocaine (XYLOCAINE) 5 % ointment Apply 1 application topically daily as needed for moderate pain.   Yes [provider]  amoxicillin-clavulanate (AUGMENTIN) 875-125 MG tablet Take 1 tablet by mouth 2 (two) times daily for 7 days. 05/28/20 06/04/20  Allayne Stack, DO  HYDROcodone-acetaminophen (NORCO/VICODIN) 5-325 MG tablet Take 1 tablet by mouth every 6 (six) hours as needed for moderate pain. 05/28/20   Allayne Stack, DO    Allergies    Patient has no known allergies.  Review of Systems   Review of Systems  Constitutional: Positive for fever. Negative for chills and fatigue.  Respiratory: Negative for cough and shortness of breath.   Cardiovascular: Negative for chest pain.  Gastrointestinal: Positive for rectal pain.  Genitourinary: Negative for scrotal swelling and testicular pain.  Skin: Positive for wound. Negative for rash.    Physical Exam Updated Vital Signs BP 131/73   Pulse 84   Temp (!)  97.4 F (36.3 C)   Resp 18   SpO2 97%   Physical Exam Constitutional:      General: He is not in acute distress.    Appearance: Normal appearance.     Comments: However appears quite uncomfortable with pelvic evaluation.   HENT:     Head: Normocephalic and atraumatic.     Mouth/Throat:     Mouth: Mucous membranes are moist.  Eyes:     Extraocular Movements: Extraocular movements intact.  Cardiovascular:     Rate and Rhythm: Normal rate and regular rhythm.  Pulmonary:     Effort: Pulmonary effort is normal.  Abdominal:     Palpations: Abdomen is soft.  Skin:    Findings: Erythema present. No rash.     Comments: Large, approximately 10 X 7 cm, region of induration and erythema on left buttock gluteal fold, with small amount of puslike drainage from  the center.  Slightly fluctuant underneath to palpation. Extends to rectal opening, unable to reach through full depth due to exquisitely tender.   Neurological:     General: No focal deficit present.     Mental Status: He is alert and oriented to person, place, and time.  Psychiatric:        Mood and Affect: Mood normal.        Behavior: Behavior normal.     ED Results / Procedures / Treatments   Labs (all labs ordered are listed, but only abnormal results are displayed) Labs Reviewed  CBC WITH DIFFERENTIAL/PLATELET - Abnormal; Notable for the following components:      Result Value   WBC 11.5 (*)    Hemoglobin 12.8 (*)    Monocytes Absolute 1.1 (*)    All other components within normal limits  COMPREHENSIVE METABOLIC PANEL - Abnormal; Notable for the following components:   Potassium 7.0 (*)    Calcium 8.7 (*)    All other components within normal limits  I-STAT CHEM 8, ED - Abnormal; Notable for the following components:   Potassium 5.3 (*)    Calcium, Ion 1.08 (*)    All other components within normal limits  I-STAT CHEM 8, ED - Abnormal; Notable for the following components:   Calcium, Ion 1.06 (*)    All other  components within normal limits    EKG None  Radiology CT PELVIS W CONTRAST  Result Date: 05/28/2020 CLINICAL DATA:  Rectal abscess EXAM: CT PELVIS WITH CONTRAST TECHNIQUE: Multidetector CT imaging of the pelvis was performed using the standard protocol following the bolus administration of intravenous contrast. CONTRAST:  OMNIPAQUE IOHEXOL 300 MG/ML  SOLN COMPARISON:  01/21/2019, 09/19/2018, 07/30/2018 FINDINGS: Urinary Tract:  No abnormality visualized. Bowel:  Unremarkable visualized pelvic bowel loops. Vascular/Lymphatic: No pathologically enlarged lymph nodes. No significant vascular abnormality seen. Reproductive:  No mass or other significant abnormality Other:  No significant pelvic effusion. Skin thickening along the medial left gluteal fold. Infiltration of the left gluteal fat consistent with cellulitis. Inflammatory mass within the subcutaneous soft tissues of the left gluteal region measuring 4.5 cm craniocaudad by 4.2 cm AP x 3.4 cm transverse. Linear soft tissue density extending from left posterior perianal region towards the inflammatory changes in the sub gluteal fat suspect for fistula. Musculoskeletal: No acute osseous abnormality. IMPRESSION: 1. Skin thickening of the medial left gluteal fold with infiltration of the subcutaneous fat consistent with cellulitis. Ill-defined focal soft tissue density without internal gas or strong rim enhancement within the subcutaneous fat of the medial gluteal region measuring up to 4.5 cm either reflecting phlegmon or developing abscess. Area of linear soft tissue density extending from the left posterior perianal region towards the inflammatory changes in the gluteal subcutaneous fat, potentially representing a fistula. Electronically Signed   By: Jasmine Pang M.D.   On: 05/28/2020 20:59    Procedures Procedures (including critical care time)  Medications Ordered in ED Medications  morphine 4 MG/ML injection 4 mg (4 mg Intravenous Given  05/28/20 1947)  iohexol (OMNIPAQUE) 300 MG/ML solution 100 mL (100 mLs Intravenous Contrast Given 05/28/20 2030)  clindamycin (CLEOCIN) IVPB 600 mg (0 mg Intravenous Stopped 05/28/20 2308)  HYDROmorphone (DILAUDID) injection 1 mg (1 mg Intravenous Given 05/28/20 2202)    ED Course  I have reviewed the triage vital signs and the nursing notes.  Pertinent labs & imaging results that were available during my care of the patient were reviewed by me and considered  in my medical decision making (see chart for details).    MDM Rules/Calculators/A&P                          44 year old gentleman with recurrent perianal abscesses and fistula requiring surgical I&D with fistulotomy repeatedly presenting for evaluation of new onset likely perianal abscess. Afebrile and VSS. Notable large 10 x 7cm region of erythematous induration on the left buttock, exquisitely tender to palpation with mild white thick drainage. Obtained CT pelvis showing changes consistent with cellulitis, ~ 4.5 cm phlegmon vs developing abscess, and possible fistula formation.  Leukocytosis of 11.  Additionally, BMP showing K 7.0, however repeat 3.8, initial likely hemolyzed.   Spoke with general surgeon on-call, Dr. Maisie Fus, who reviewed the images.  Recommended antibiotics and following up in the office vs bedside I&D trial.  Discussed findings and options with patient, opted for antibiotics and pain control to then schedule follow-up with his surgeon, Dr. Cliffton Asters, this Friday.  Sent in Augmentin BID x 7 days with short supply of Norco.  PMP aware reviewed and appropriate.  Received 1 dose of IV clindamycin and IV pain relief while in the ED.   Final Clinical Impression(s) / ED Diagnoses Final diagnoses:  Perianal abscess    Rx / DC Orders ED Discharge Orders         Ordered    amoxicillin-clavulanate (AUGMENTIN) 875-125 MG tablet  2 times daily     Discontinue  Reprint     05/28/20 2230    HYDROcodone-acetaminophen  (NORCO/VICODIN) 5-325 MG tablet  Every 6 hours PRN     Discontinue  Reprint     05/28/20 2230           Allayne Stack, DO 05/29/20 1132    Charlynne Pander, MD 05/29/20 2328

## 2020-08-08 IMAGING — CT CT PELVIS W/ CM
2 of 3 series · 16 of 46 positions shown, 18 images · IV contrast (APPLIED)
Comparison: 07/30/2018

CLINICAL DATA: 42-year-old male with history of recurrent perianal
abscess, recent I and D and low trans-sphincteric fistula with seton
placement, now with new LEFT perianal pain for 2 days.

EXAM:
CT PELVIS WITH CONTRAST
TECHNIQUE: Multidetector CT imaging of the pelvis was performed using the
standard protocol following the bolus administration of intravenous
contrast.
CONTRAST:  100mL OMNIPAQUE IOHEXOL 300 MG/ML  SOLN

[Series 3: abd/ pelvis 5.0 i30f 2 · axial · 0.83mm/px · z∈[+618,+908]mm · 13 of 68 slices shown, 15 images]
[im 5/68  soft-tissue]
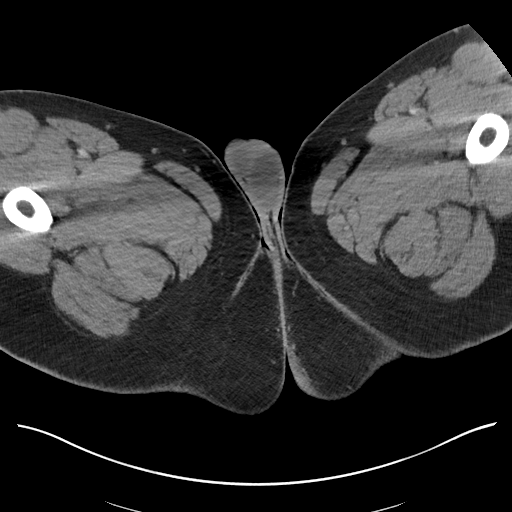
[im 5/68  bone]
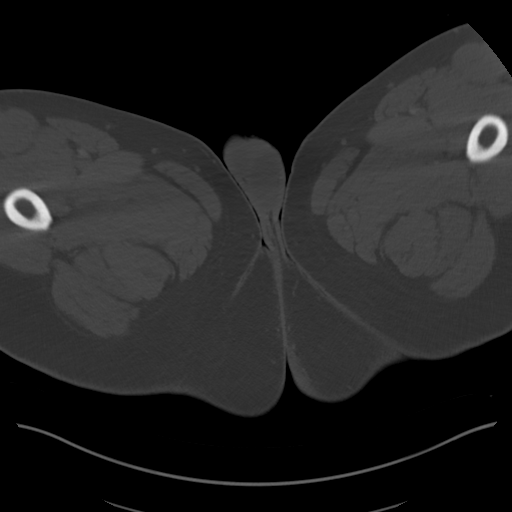
[im 9/68  soft-tissue]
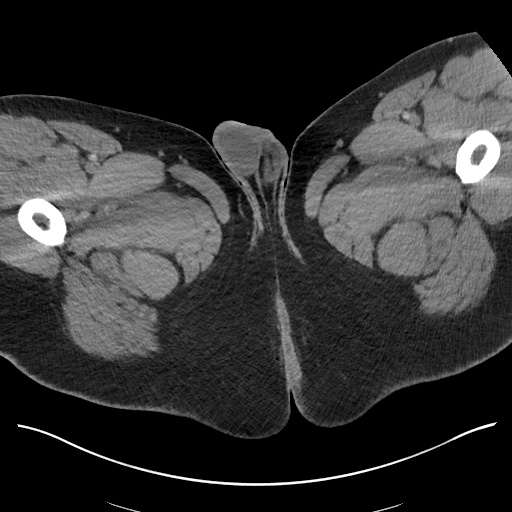
[im 13/68  soft-tissue]
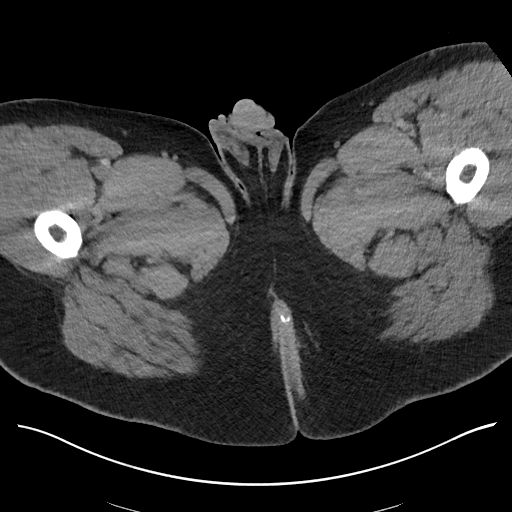
[im 20/68  soft-tissue]
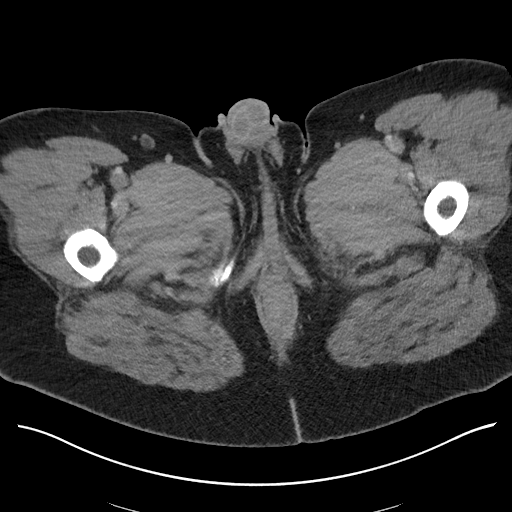
[im 24/68  soft-tissue]
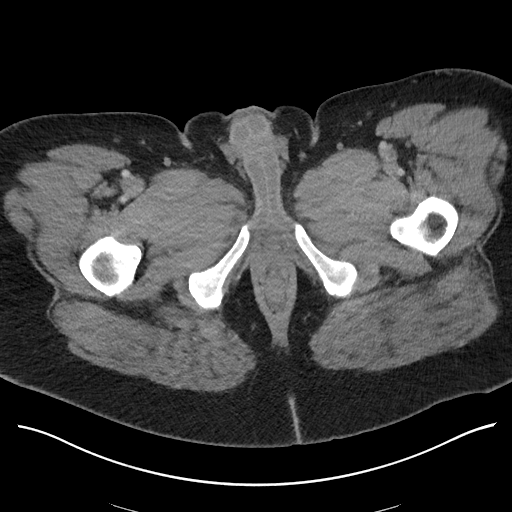
[im 29/68  soft-tissue]
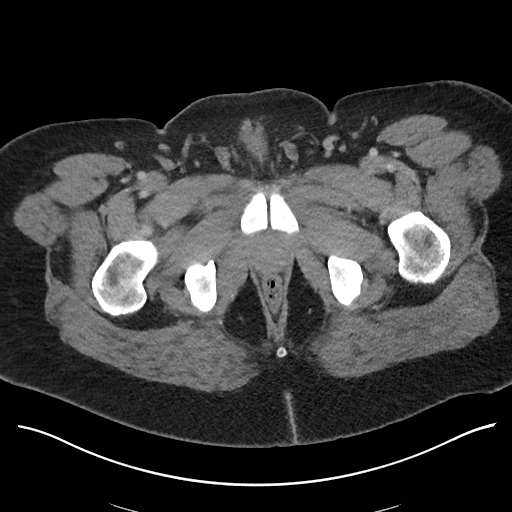
[im 35/68  soft-tissue]
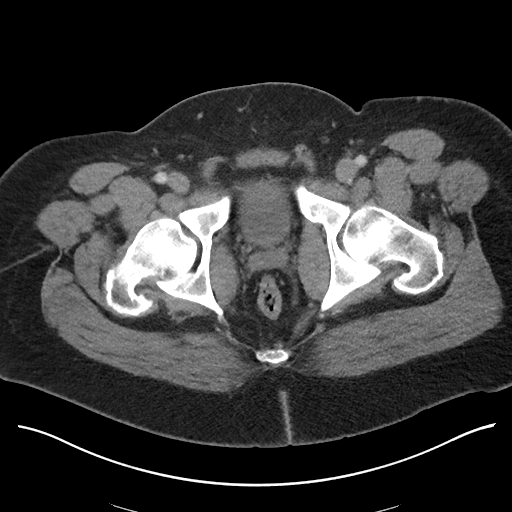
[im 39/68  soft-tissue]
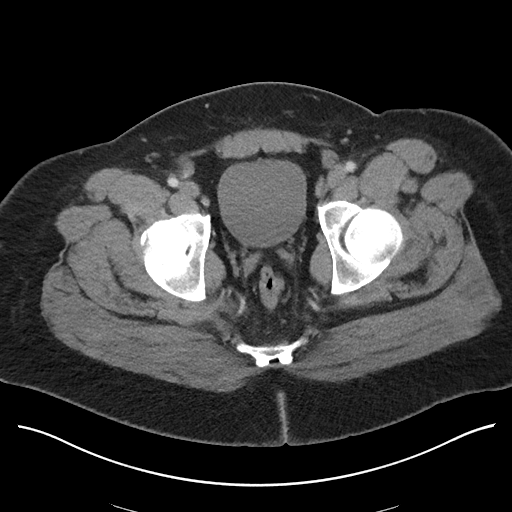
[im 44/68  soft-tissue]
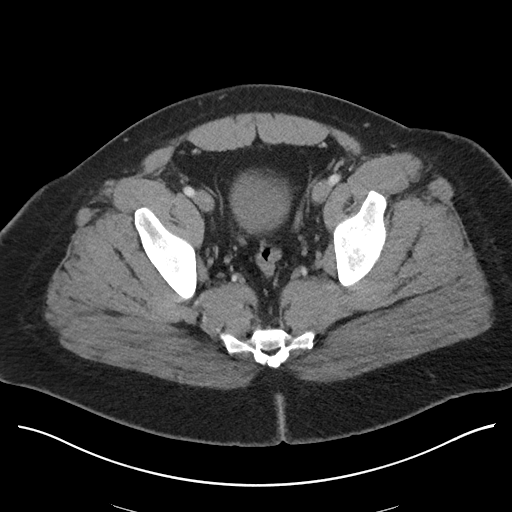
[im 44/68  bone]
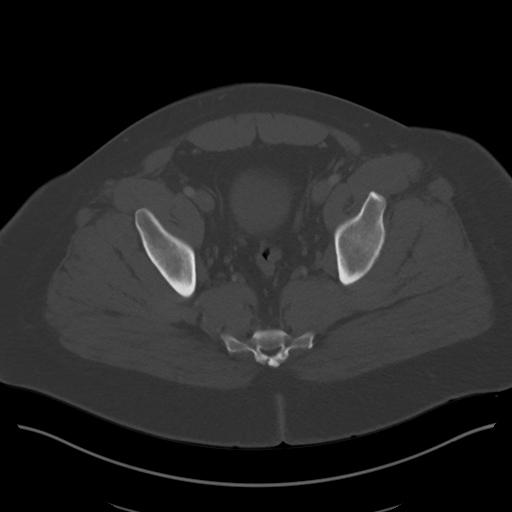
[im 48/68  soft-tissue]
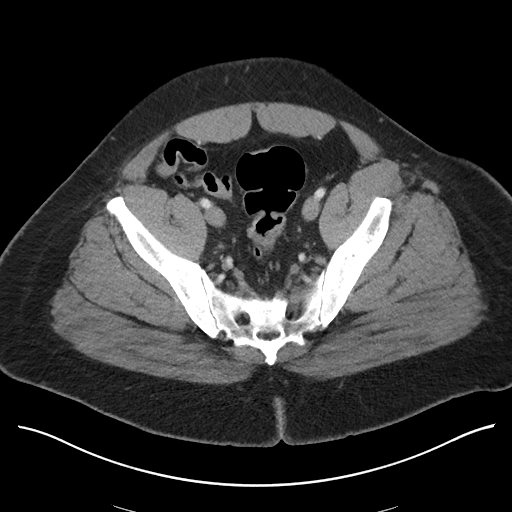
[im 55/68  soft-tissue]
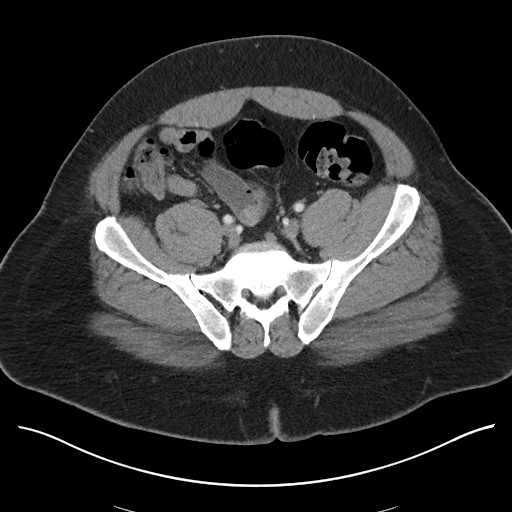
[im 59/68  soft-tissue]
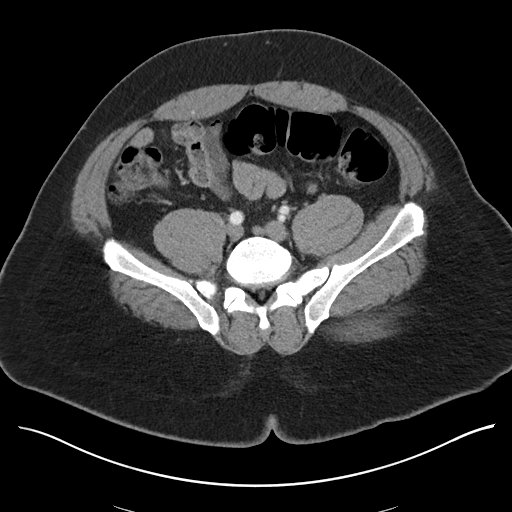
[im 63/68  soft-tissue]
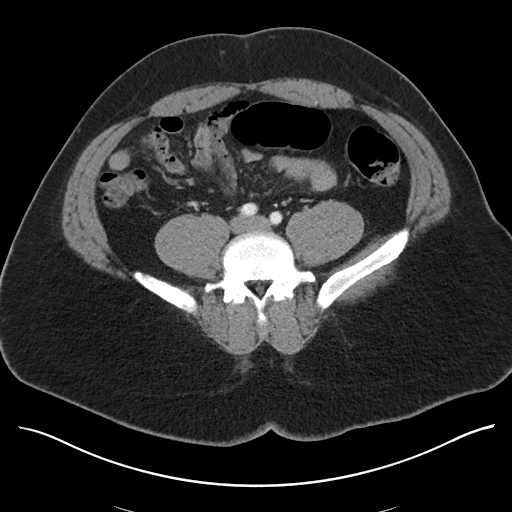

[Series 5: coronal soft tissue · coronal · 0.66mm/px · 3 of 100 slices shown]
[im 34/100  soft-tissue]
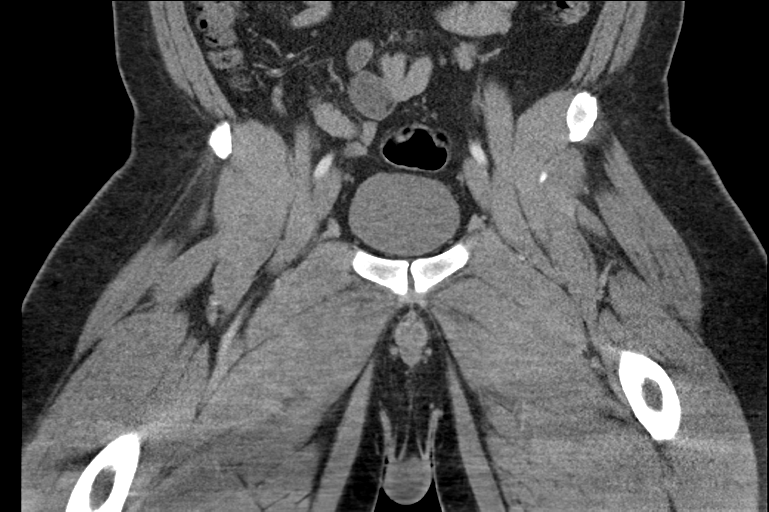
[im 45/100  soft-tissue]
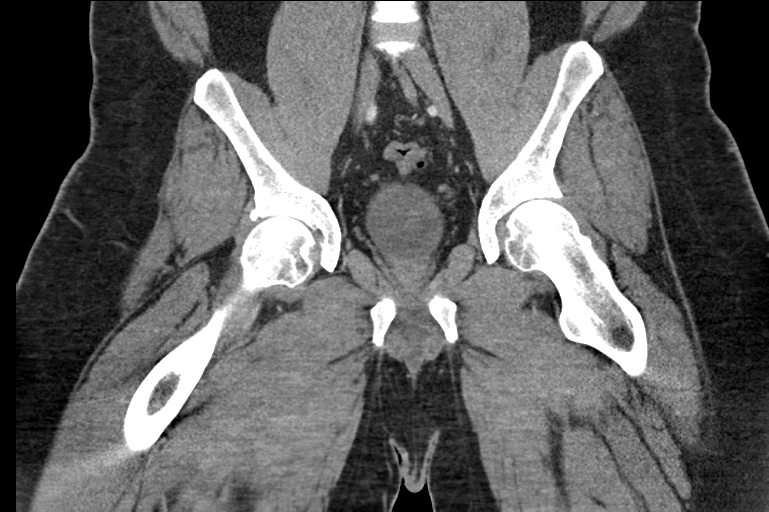
[im 56/100  soft-tissue]
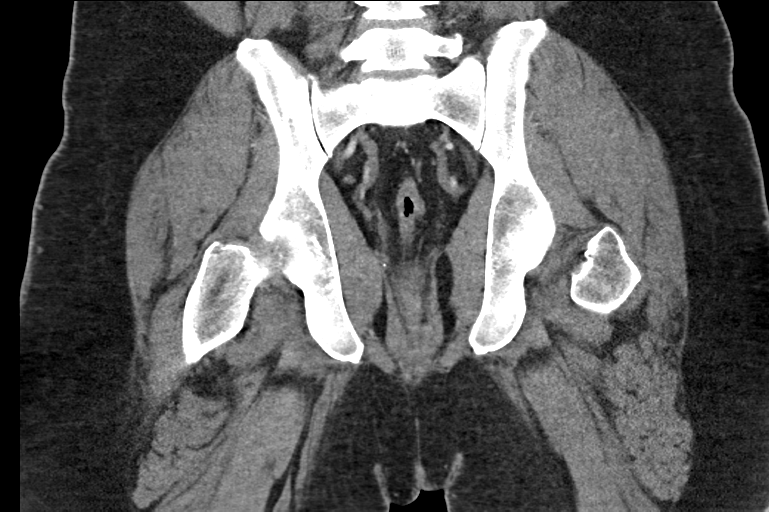

[16 of 46 positions shown; findings below may reference images not displayed]

FINDINGS: Urinary Tract:  No abnormality visualized.

Bowel: Linear high density structure in the LEFT perianal region is
compatible with history of seton placement for fistula. No discrete
perianal abscess noted. Mild soft tissue density in the MEDIAL LEFT
gluteal region along the intergluteal cleft likely represents
changes from prior infection/inflammation. No other bowel
abnormalities identified.

Vascular/Lymphatic: No pathologically enlarged lymph nodes. No
significant vascular abnormality seen.

Reproductive:  No mass or other significant abnormality

Other:  None.

Musculoskeletal: No suspicious bone lesions identified.
IMPRESSION: 1. No CT evidence of recurrent perianal/rectal abscess. Linear high
density LEFT perianal structure compatible with seton placement.
Mild soft tissue density in the MEDIAL LEFT gluteal region likely
represents changes from prior infection/inflammation.
2. No acute abnormalities identified.

## 2021-06-26 ENCOUNTER — Emergency Department (HOSPITAL_BASED_OUTPATIENT_CLINIC_OR_DEPARTMENT_OTHER)
Admission: EM | Admit: 2021-06-26 | Discharge: 2021-06-26 | Disposition: A | Discharge status: Home / Self Care | Payer: 59 | Attending: Emergency Medicine | Admitting: Emergency Medicine

## 2021-06-26 ENCOUNTER — Encounter (HOSPITAL_BASED_OUTPATIENT_CLINIC_OR_DEPARTMENT_OTHER): Payer: Self-pay | Admitting: Emergency Medicine

## 2021-06-26 ENCOUNTER — Other Ambulatory Visit: Payer: Self-pay

## 2021-06-26 DIAGNOSIS — R2241 Localized swelling, mass and lump, right lower limb: Secondary | ICD-10-CM | POA: Diagnosis present

## 2021-06-26 DIAGNOSIS — Z79899 Other long term (current) drug therapy: Secondary | ICD-10-CM | POA: Diagnosis not present

## 2021-06-26 DIAGNOSIS — I10 Essential (primary) hypertension: Secondary | ICD-10-CM | POA: Insufficient documentation

## 2021-06-26 DIAGNOSIS — L0231 Cutaneous abscess of buttock: Secondary | ICD-10-CM | POA: Diagnosis not present

## 2021-06-26 DIAGNOSIS — L03317 Cellulitis of buttock: Secondary | ICD-10-CM | POA: Insufficient documentation

## 2021-06-26 DIAGNOSIS — Z87891 Personal history of nicotine dependence: Secondary | ICD-10-CM | POA: Insufficient documentation

## 2021-06-26 MED ORDER — HYDROCODONE-ACETAMINOPHEN 5-325 MG PO TABS
1.0000 | ORAL_TABLET | Freq: Once | ORAL | Status: AC
  Administered 2021-06-26: 1 via ORAL
  Filled 2021-06-26: qty 1

## 2021-06-26 MED ORDER — HYDROCODONE-ACETAMINOPHEN 5-325 MG PO TABS: 1.0000 | ORAL_TABLET | ORAL | 0 refills | Status: DC | PRN

## 2021-06-26 MED ORDER — SULFAMETHOXAZOLE-TRIMETHOPRIM 800-160 MG PO TABS: 1.0000 | ORAL_TABLET | Freq: Two times a day (BID) | ORAL | 0 refills | Status: AC

## 2021-06-26 MED ORDER — IBUPROFEN 800 MG PO TABS
800.0000 mg | ORAL_TABLET | Freq: Once | ORAL | Status: AC
  Administered 2021-06-26: 800 mg via ORAL
  Filled 2021-06-26: qty 1

## 2021-06-26 MED ORDER — SULFAMETHOXAZOLE-TRIMETHOPRIM 800-160 MG PO TABS
1.0000 | ORAL_TABLET | Freq: Once | ORAL | Status: AC
  Administered 2021-06-26: 1 via ORAL
  Filled 2021-06-26: qty 1

## 2021-06-26 MED ORDER — LIDOCAINE-EPINEPHRINE (PF) 2 %-1:200000 IJ SOLN
10.0000 mL | Freq: Once | INTRAMUSCULAR | Status: AC
  Administered 2021-06-26: 10 mL
  Filled 2021-06-26: qty 20

## 2021-06-26 MED ORDER — CHLORHEXIDINE GLUCONATE 4 % EX LIQD: Freq: Every day | CUTANEOUS | 0 refills | Status: DC | PRN

## 2021-06-26 NOTE — ED Provider Notes (Signed)
[facility] EMERGENCY DEPT Provider Note   CSN: [id number] Arrival date & time: 06/26/21  1148     History Chief Complaint  Patient presents with   Abscess    Martin Rose is a 45 y.o. male.  Pt presents to the ED today with an abscess to his right buttock.  It has been there for 4 days.  Pt has a hx of frequent perirectal abscesses.  He is followed by Dr. Cliffton Asters.        Past Medical History:  Diagnosis Date   History of rectal abscess    Hypertension    Palpitations 07/11/2017   per pt only feels when he over exerts himself without sob   Perirectal fistula    wound/  left anterior    Patient Active Problem List   Diagnosis Date Noted   Perianal abscess 07/30/2018   Skin rash 05/22/2018   Peri-rectal abscess 03/23/2018   Rectal abscess 03/22/2018   Perirectal abscess 03/22/2018   Palpitations 07/11/2017   Alcohol use 07/11/2017   Allergic rhinitis 06/24/2015   Morbid obesity (HCC) 04/30/2013   Snoring 04/30/2013   Hidradenitis 09/04/2012   Essential hypertension, benign 03/10/2009    Past Surgical History:  Procedure Laterality Date   ANAL FISTULOTOMY N/A 11/20/2018   Procedure: FISTULOTOMY placement cutting seton;  Surgeon: Andria Meuse, MD;  Location: [facility];  Service: General;  Laterality: N/A;   CARDIAC CATHETERIZATION  07-20-2006   dr Jenne Campus   abnormal cardiolite--  no sig. cad, normal LVF, ef 60%, systemic HTN, elevated LVEDP (22)   FISTULOTOMY N/A 09/11/2018   Procedure: FISTULOTOMY;  Surgeon: Andria Meuse, MD;  Location: [facility];  Service: General;  Laterality: N/A;   FISTULOTOMY N/A 02/14/2020   Procedure: INCISION AND DRAINAGE OF PERIANAL COLLECTION;  Surgeon: Andria Meuse, MD;  [facility];  Service: General;  Laterality: N/A;   INCISION AND DRAINAGE PERIRECTAL ABSCESS Left 03/22/2018   Procedure: IRRIGATION AND DEBRIDEMENT PERIRECTAL ABSCESS;  Surgeon: Jimmye Norman, MD;   Location: [facility];  Service: General;  Laterality: Left;   INCISION AND DRAINAGE PERIRECTAL ABSCESS N/A 07/31/2018   Procedure: IRRIGATION AND DEBRIDEMENT PERIRECTAL ABSCESS;  Surgeon: Jimmye Norman, MD;  Location: [facility];  Service: General;  Laterality: N/A;   PLACEMENT OF SETON N/A 09/11/2018   Procedure: PLACEMENT OF SETON;  Surgeon: Andria Meuse, MD;  Location: [facility];  Service: General;  Laterality: N/A;   RECTAL EXAM UNDER ANESTHESIA N/A 11/20/2018   Procedure: ANORECTAL EXAM UNDER ANESTHESIA;  Surgeon: Andria Meuse, MD;  Location: [facility];  Service: General;  Laterality: N/A;   RECTAL EXAM UNDER ANESTHESIA N/A 02/14/2020   Procedure: ANORECTAL EXAM UNDER ANESTHESIA;  Surgeon: Andria Meuse, MD;  Location: [facility];  Service: General;  Laterality: N/A;   TRANSTHORACIC ECHOCARDIOGRAM  11/29/2011   moderate LVH, ef 55-60%,  grade 2 diastolic dysfunction/  trivial MR and TR/  mild LAE       Family History  Problem Relation Age of Onset   Hypertension Mother    Hypertension Father    Cancer Sister 30       brain tumor   Stroke Sister     Social History   Tobacco Use   Smoking status: Former    Packs/day: 0.50    Years: 15.00    Pack years: 7.50    Types: Cigarettes    Quit date: 2010    Years since  quitting: 12.5   Smokeless tobacco: Never  Vaping Use   Vaping Use: Never used  Substance Use Topics   Alcohol use: Yes    Alcohol/week: 24.0 standard drinks    Types: 24 Cans of beer per week    Comment: 2 beers a day   Drug use: Yes    Types: Marijuana    Comment: 02-08-2020 "2- times/wk"    Home Medications Prior to Admission medications   Medication Sig Start Date End Date Taking? Authorizing Provider  chlorhexidine (HIBICLENS) 4 % external liquid Apply topically daily as needed. Apply to area prone to getting abscesses once this abscess has healed. 06/26/21  Yes Jacalyn Lefevre, MD  HYDROcodone-acetaminophen  (NORCO/VICODIN) 5-325 MG tablet Take 1 tablet by mouth every 4 (four) hours as needed. 06/26/21  Yes Jacalyn Lefevre, MD  sulfamethoxazole-trimethoprim (BACTRIM DS) 800-160 MG tablet Take 1 tablet by mouth 2 (two) times daily for 7 days. 06/26/21 07/03/21 Yes Jacalyn Lefevre, MD  amLODipine (NORVASC) 10 MG tablet TAKE 1 TABLET(10 MG) BY MOUTH EVERY MORNING Patient taking differently: Take 10 mg by mouth daily.  10/17/19   Marthenia Rolling, DO  atenolol (TENORMIN) 50 MG tablet TAKE 1 TABLET(50 MG) BY MOUTH EVERY MORNING Patient taking differently: Take 50 mg by mouth daily.  10/17/19   Marthenia Rolling, DO  ibuprofen (ADVIL,MOTRIN) 200 MG tablet Take 1-4 tablets (200-800 mg total) by mouth every 6 (six) hours as needed (for pain). Patient taking differently: Take 600-800 mg by mouth every 6 (six) hours as needed for headache or moderate pain.  08/02/18   Barnetta Chapel, PA-C  lidocaine (XYLOCAINE) 5 % ointment Apply 1 application topically daily as needed for moderate pain.    [provider]    Allergies    Patient has no known allergies.  Review of Systems   Review of Systems  Skin:        abscess  All other systems reviewed and are negative.  Physical Exam Updated Vital Signs BP 122/87   Pulse 77   Temp 98.7 F (37.1 C) (Oral)   Resp 20   Ht 6' (1.829 m)   Wt 122.5 kg   SpO2 100%   BMI 36.62 kg/m   Physical Exam Vitals and nursing note reviewed.  Constitutional:      Appearance: Normal appearance.  HENT:     Head: Normocephalic and atraumatic.     Right Ear: External ear normal.     Left Ear: External ear normal.     Nose: Nose normal.     Mouth/Throat:     Mouth: Mucous membranes are moist.     Pharynx: Oropharynx is clear.  Eyes:     Extraocular Movements: Extraocular movements intact.     Conjunctiva/sclera: Conjunctivae normal.     Pupils: Pupils are equal, round, and reactive to light.  Cardiovascular:     Rate and Rhythm: Normal rate and regular rhythm.      Pulses: Normal pulses.     Heart sounds: Normal heart sounds.  Pulmonary:     Effort: Pulmonary effort is normal.     Breath sounds: Normal breath sounds.  Abdominal:     General: Abdomen is flat. Bowel sounds are normal.     Palpations: Abdomen is soft.  Musculoskeletal:     Cervical back: Normal range of motion and neck supple.  Skin:    Capillary Refill: Capillary refill takes less than 2 seconds.     Comments: Abscess to right buttock.  Right buttock  cellulitis.  Neurological:     General: No focal deficit present.     Mental Status: He is alert and oriented to person, place, and time.  Psychiatric:        Mood and Affect: Mood normal.        Behavior: Behavior normal.    ED Results / Procedures / Treatments   Labs (all labs ordered are listed, but only abnormal results are displayed) Labs Reviewed - No data to display  EKG None  Radiology No results found.  Procedures .Marland KitchenIncision and Drainage  Date/Time: 06/26/2021 2:51 PM Performed by: Jacalyn Lefevre, MD Authorized by: Jacalyn Lefevre, MD   Consent:    Consent obtained:  Verbal   Consent given by:  Patient   Risks discussed:  Incomplete drainage and pain   Alternatives discussed:  No treatment Universal protocol:    Patient identity confirmed:  Verbally with patient Location:    Type:  Abscess   Size:  5   Location:  Anogenital   Anogenital location: right buttock. Pre-procedure details:    Skin preparation:  Povidone-iodine Sedation:    Sedation type:  None Anesthesia:    Anesthesia method:  Local infiltration Procedure type:    Complexity:  Simple Procedure details:    Incision types:  Cruciate   Wound management:  Probed and deloculated   Drainage:  Purulent   Drainage amount:  Copious   Wound treatment:  Wound left open   Packing materials:  None Post-procedure details:    Procedure completion:  Tolerated with difficulty Comments:     Pt had a hard time staying still during procedure.  He  was unable to tolerated any deep probing or much pushing around the abscess.  However, A LOT of pus was drained.   Medications Ordered in ED Medications  ibuprofen (ADVIL) tablet 800 mg (800 mg Oral Given 06/26/21 1422)  HYDROcodone-acetaminophen (NORCO/VICODIN) 5-325 MG per tablet 1 tablet (1 tablet Oral Given 06/26/21 1422)  sulfamethoxazole-trimethoprim (BACTRIM DS) 800-160 MG per tablet 1 tablet (1 tablet Oral Given 06/26/21 1423)  lidocaine-EPINEPHrine (XYLOCAINE W/EPI) 2 %-1:200000 (PF) injection 10 mL (10 mLs Infiltration Given 06/26/21 1423)    ED Course  I have reviewed the triage vital signs and the nursing notes.  Pertinent labs & imaging results that were available during my care of the patient were reviewed by me and considered in my medical decision making (see chart for details).    MDM Rules/Calculators/A&P                          Pt d/c with bactrim and lortab.  He is also given a rx for chlorhexidine liquid to apply to the areas prone to developing abscesses.  Final Clinical Impression(s) / ED Diagnoses Final diagnoses:  Abscess of buttock, right  Cellulitis of buttock, right    Rx / DC Orders ED Discharge Orders          Ordered    sulfamethoxazole-trimethoprim (BACTRIM DS) 800-160 MG tablet  2 times daily        06/26/21 1455    HYDROcodone-acetaminophen (NORCO/VICODIN) 5-325 MG tablet  Every 4 hours PRN        06/26/21 1455    chlorhexidine (HIBICLENS) 4 % external liquid  Daily PRN        06/26/21 1455             Jacalyn Lefevre, MD 06/26/21 1456

## 2021-06-26 NOTE — ED Triage Notes (Signed)
Abscess to buttocks x 4 days.

## 2021-06-26 NOTE — ED Notes (Signed)
Pt discharged home after verbalizing understanding of discharge instructions; nad noted. 

## 2021-08-13 ENCOUNTER — Ambulatory Visit: Payer: 59 | Admitting: Podiatry

## 2022-01-19 ENCOUNTER — Encounter (HOSPITAL_BASED_OUTPATIENT_CLINIC_OR_DEPARTMENT_OTHER): Payer: Self-pay | Admitting: Emergency Medicine

## 2022-01-19 ENCOUNTER — Other Ambulatory Visit: Payer: Self-pay

## 2022-01-19 ENCOUNTER — Emergency Department (HOSPITAL_BASED_OUTPATIENT_CLINIC_OR_DEPARTMENT_OTHER)
Admission: EM | Admit: 2022-01-19 | Discharge: 2022-01-19 | Disposition: A | Discharge status: Home / Self Care | Payer: 59 | Attending: Emergency Medicine | Admitting: Emergency Medicine

## 2022-01-19 DIAGNOSIS — Z79899 Other long term (current) drug therapy: Secondary | ICD-10-CM | POA: Insufficient documentation

## 2022-01-19 DIAGNOSIS — L0231 Cutaneous abscess of buttock: Secondary | ICD-10-CM | POA: Diagnosis not present

## 2022-01-19 DIAGNOSIS — I10 Essential (primary) hypertension: Secondary | ICD-10-CM | POA: Diagnosis not present

## 2022-01-19 MED ORDER — OXYCODONE-ACETAMINOPHEN 5-325 MG PO TABS
1.0000 | ORAL_TABLET | Freq: Once | ORAL | Status: AC
  Administered 2022-01-19: 1 via ORAL
  Filled 2022-01-19: qty 1

## 2022-01-19 MED ORDER — AMOXICILLIN-POT CLAVULANATE 875-125 MG PO TABS: 1.0000 | ORAL_TABLET | Freq: Two times a day (BID) | ORAL | 0 refills | Status: DC

## 2022-01-19 MED ORDER — LIDOCAINE-EPINEPHRINE (PF) 2 %-1:200000 IJ SOLN
10.0000 mL | Freq: Once | INTRAMUSCULAR | Status: AC
  Administered 2022-01-19: 10 mL via INTRADERMAL
  Filled 2022-01-19: qty 20

## 2022-01-19 MED ORDER — CHLORHEXIDINE GLUCONATE 4 % EX LIQD: Freq: Every day | CUTANEOUS | 0 refills | Status: DC | PRN

## 2022-01-19 NOTE — ED Notes (Signed)
ED Provider at bedside. 

## 2022-01-19 NOTE — ED Provider Notes (Signed)
[facility] EMERGENCY DEPT Provider Note   CSN: [id number] Arrival date & time: 01/19/22  1302     History  Chief Complaint  Patient presents with   Abscess    Martin Rose is a 46 y.o. male with history of hidradenitis and recurrent buttock abscesses who presents with concern for abscess x7 days.  Progressively worsening pain and swelling over the last 48 hours on his left buttock.  Patient drives a box truck and states that his pain is most severe in the last couple of days.  No fevers, chills, nausea, vomiting, or other systemic symptoms.  He has been doing sitz bath's and warm compresses at home without successful drainage.  No pain with bowel movements or rectal pain.  I personally reviewed the patient's medical records in addition to the above he has history of palpitations and hypertension.  HPI     Home Medications Prior to Admission medications   Medication Sig Start Date End Date Taking? Authorizing Provider  amLODipine (NORVASC) 10 MG tablet TAKE 1 TABLET(10 MG) BY MOUTH EVERY MORNING Patient taking differently: Take 10 mg by mouth daily.  10/17/19   Marthenia Rolling, DO  atenolol (TENORMIN) 50 MG tablet TAKE 1 TABLET(50 MG) BY MOUTH EVERY MORNING Patient taking differently: Take 50 mg by mouth daily.  10/17/19   Marthenia Rolling, DO  chlorhexidine (HIBICLENS) 4 % external liquid Apply topically daily as needed. Apply to area prone to getting abscesses once this abscess has healed. 06/26/21   Jacalyn Lefevre, MD  HYDROcodone-acetaminophen (NORCO/VICODIN) 5-325 MG tablet Take 1 tablet by mouth every 4 (four) hours as needed. 06/26/21   Jacalyn Lefevre, MD  ibuprofen (ADVIL,MOTRIN) 200 MG tablet Take 1-4 tablets (200-800 mg total) by mouth every 6 (six) hours as needed (for pain). Patient taking differently: Take 600-800 mg by mouth every 6 (six) hours as needed for headache or moderate pain.  08/02/18   Barnetta Chapel, PA-C  lidocaine (XYLOCAINE) 5 % ointment Apply 1  application topically daily as needed for moderate pain.    [provider]      Allergies    Patient has no known allergies.    Review of Systems   Review of Systems  Constitutional: Negative.   HENT: Negative.    Eyes: Negative.   Respiratory: Negative.    Cardiovascular: Negative.   Gastrointestinal: Negative.   Genitourinary: Negative.        Abcsess to buttock  Musculoskeletal: Negative.   Skin: Negative.   Neurological: Negative.   Hematological: Negative.   Psychiatric/Behavioral: Negative.     Physical Exam Updated Vital Signs BP 116/71    Pulse 64    Temp (!) 97.3 F (36.3 C)    Resp 18    Ht 6' (1.829 m)    Wt 129.3 kg    SpO2 99%    BMI 38.65 kg/m  Physical Exam Vitals and nursing note reviewed. Exam conducted with a chaperone present.  HENT:     Head: Normocephalic and atraumatic.  Eyes:     General: No scleral icterus.       Right eye: No discharge.        Left eye: No discharge.     Conjunctiva/sclera: Conjunctivae normal.  Pulmonary:     Effort: Pulmonary effort is normal.  Genitourinary:   Skin:    General: Skin is warm and dry.     Capillary Refill: Capillary refill takes less than 2 seconds.  Neurological:     General: No focal  deficit present.     Mental Status: He is alert.  Psychiatric:        Mood and Affect: Mood normal.    ED Results / Procedures / Treatments   Labs (all labs ordered are listed, but only abnormal results are displayed) Labs Reviewed - No data to display  EKG None  Radiology No results found.  Procedures Ultrasound ED Soft Tissue  Date/Time: 01/19/2022 4:15 PM Performed by: Paris Lore, PA-C Authorized by: Paris Lore, PA-C   Procedure details:    Indications: localization of abscess     Transverse view:  Visualized   Longitudinal view:  Visualized   Images: not archived     Limitations:  Body habitus Location:    Location: buttocks     Side:  Left Findings:     abscess  present    cellulitis present .Marland KitchenIncision and Drainage  Date/Time: 01/19/2022 4:46 PM Performed by: Paris Lore, PA-C Authorized by: Paris Lore, PA-C   Consent:    Consent obtained:  Verbal   Consent given by:  Patient   Risks discussed:  Bleeding, incomplete drainage, pain and damage to other organs   Alternatives discussed:  No treatment Universal protocol:    Procedure explained and questions answered to patient or proxy's satisfaction: yes     Relevant documents present and verified: yes     Test results available : yes     Imaging studies available: yes     Required blood products, implants, devices, and special equipment available: yes     Site/side marked: yes     Immediately prior to procedure, a time out was called: yes     Patient identity confirmed:  Verbally with patient Location:    Type:  Abscess   Size:  4x3 cm   Location:  Lower extremity   Lower extremity location:  Buttock   Buttock location:  L buttock Pre-procedure details:    Skin preparation:  Betadine Sedation:    Sedation type:  None Anesthesia:    Anesthesia method:  Local infiltration   Local anesthetic:  Lidocaine 2% WITH epi Procedure type:    Complexity:  Complex Procedure details:    Incision types:  Single straight   Incision depth:  Subcutaneous   Wound management:  Probed and deloculated, irrigated with saline and extensive cleaning   Drainage:  Purulent   Drainage amount:  Copious   Packing materials:  None Post-procedure details:    Procedure completion:  Tolerated well, no immediate complications    Medications Ordered in ED Medications  lidocaine-EPINEPHrine (XYLOCAINE W/EPI) 2 %-1:200000 (PF) injection 10 mL (has no administration in time range)    ED Course/ Medical Decision Making/ A&P                           Medical Decision Making 46 year old male with history of recurrent abscesses secondary to hidradenitis presents with concern for abscess in the  left buttock progressively worsening for the last week.  Hypertensive on intake, vitals otherwise normal.  Cardiopulmonary exam is normal, abdominal Sam is benign.  Patient with abscess to the left buttock as above, 7 cm from the anus without concern for perirectal tracking.  Bedside ultrasound confirmed location of abscess without extension towards the anus.  Risk Prescription drug management.  Abscess was drained as above, patient tolerated the procedure well.  Will discharge with prescription for Hibiclens and antibiotic.  Recommend sitz baths and warm  compresses at home.  [place] voiced understanding with medical evaluation and treatment plan.  She was questions answered was expressed satisfaction.  Clinical suspicion is exceedingly low for any further urgent or emergent underlying condition that would warrant further work-up in the emergency department or inpatient management.  Patient was well-appearing, stable, and was discharged in good condition.  This chart was dictated using voice recognition software, Dragon. Despite the best efforts of this provider to proofread and correct errors, errors may still occur which can change documentation meaning.    Final Clinical Impression(s) / ED Diagnoses Final diagnoses:  None    Rx / DC Orders ED Discharge Orders     None         Sherrilee Gilles 01/19/22 1648    Lorre Nick, MD 01/21/22 2245

## 2022-01-19 NOTE — ED Notes (Signed)
EMT-P provided AVS using Teachback Method. Patient verbalizes understanding of Discharge Instructions. Opportunity for Questioning and Answers were provided by EMT-P. Patient Discharged from ED.  ? ?

## 2022-01-19 NOTE — Discharge Instructions (Addendum)
You were seen in the ER today for the abscess on your buttocks.  It was drained in the emergency department successfully.  You have been prescribed antibiotics and chlorhexidine body wash which you should use in the buttock area to try to prevent further recurrence of your infections.  Use sitz bath and warm compresses at home for the next few days to continue to encourage drainage and follow-up with your primary care doctor.  Return to ER with any new severe symptoms.

## 2022-01-19 NOTE — ED Triage Notes (Signed)
Pt arrives to ED with c/o abscess. The abscess is located on pts left buttocks. This started x7 days ago. Pt has hx of perianal abscess.

## 2022-03-17 ENCOUNTER — Encounter (HOSPITAL_BASED_OUTPATIENT_CLINIC_OR_DEPARTMENT_OTHER): Payer: Self-pay | Admitting: Family Medicine

## 2022-03-17 ENCOUNTER — Ambulatory Visit (INDEPENDENT_AMBULATORY_CARE_PROVIDER_SITE_OTHER): Payer: 59 | Admitting: Family Medicine

## 2022-03-17 VITALS — BP 134/60 | HR 72 | Temp 97.8°F | Ht 72.0 in | Wt 294.9 lb

## 2022-03-17 DIAGNOSIS — Z0000 Encounter for general adult medical examination without abnormal findings: Secondary | ICD-10-CM

## 2022-03-17 DIAGNOSIS — I10 Essential (primary) hypertension: Secondary | ICD-10-CM

## 2022-03-17 DIAGNOSIS — M7989 Other specified soft tissue disorders: Secondary | ICD-10-CM

## 2022-03-17 DIAGNOSIS — Z1211 Encounter for screening for malignant neoplasm of colon: Secondary | ICD-10-CM

## 2022-03-17 MED ORDER — AMLODIPINE BESYLATE 10 MG PO TABS: 10.0000 mg | ORAL_TABLET | Freq: Every day | ORAL | 1 refills | Status: DC

## 2022-03-17 MED ORDER — ATENOLOL 50 MG PO TABS: 50.0000 mg | ORAL_TABLET | Freq: Every day | ORAL | 1 refills | Status: DC

## 2022-03-17 NOTE — Patient Instructions (Signed)

## 2022-03-17 NOTE — Assessment & Plan Note (Signed)
Blood pressure control adequate in office today ?Continue with amlodipine and atenolol ?Recommend intermittent monitoring of blood pressure at home, DASH diet, regular aerobic exercise ?

## 2022-03-17 NOTE — Assessment & Plan Note (Signed)
Area over lateral aspect of right foot appears consistent with possible ganglion cyst.  Will refer to podiatry for further evaluation and recommendations, considerations may include aspiration versus excision ?

## 2022-03-17 NOTE — Progress Notes (Signed)
? ?New Patient Office Visit ? ?Subjective:  ?Patient ID: Martin Rose, male    DOB: February 02, 1976  Age: 46 y.o. MRN: [id number] ? ?CC:  ?Chief Complaint  ?Patient presents with  ? New Patient (Initial Visit)  ? ? ?HPI ?Martin Rose is a 46 year old male presenting to establish in clinic.  Reports past medical history of hypertension, has current concerns related to area of swelling on his right foot as well as history of rectal abscesses. ? ?Hypertension: Current medications include amlodipine 10 mg and atenolol 50 mg.  He has been on his medications for a while, denies any side effects.  He has not had any recent chest pain, headaches, lightheadedness or dizziness. ? 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 Cliffton Asters.  He also indicates that he has been advised on having regular colonoscopies about every 2 to 4 years.  Requesting referral today to gastroenterologist for colonoscopy. ? ?Right foot swelling: Has had area over right lateral foot that he has noticed for a short period of time, has not had any significant change in size recently.  No specific tenderness over the area.  Denies any recent injury. ? ?Patient is originally from [place].  He works as a [employer] fast streamer.  Outside of work he enjoys spending time with his family, grandkids, walking, fishing.  He is also a [employer] fan ? ?Past Medical History:  ?Diagnosis Date  ? History of rectal abscess   ? Hypertension   ? Palpitations 07/11/2017  ? per pt only feels when he over exerts himself without sob  ? Perirectal fistula   ? wound/  left anterior  ? ? ?Past Surgical History:  ?Procedure Laterality Date  ? ANAL FISTULOTOMY N/A 11/20/2018  ? Procedure: FISTULOTOMY placement cutting seton;  Surgeon: Andria Meuse, MD;  Location: [facility];  Service: General;  Laterality: N/A;  ? CARDIAC CATHETERIZATION  07-20-2006   dr Jenne Campus  ? abnormal cardiolite--  no sig. cad, normal LVF,  ef 60%, systemic HTN, elevated LVEDP (22)  ? FISTULOTOMY N/A 09/11/2018  ? Procedure: FISTULOTOMY;  Surgeon: Andria Meuse, MD;  Location: [facility];  Service: General;  Laterality: N/A;  ? FISTULOTOMY N/A 02/14/2020  ? Procedure: INCISION AND DRAINAGE OF PERIANAL COLLECTION;  Surgeon: Andria Meuse, MD;  Location: [facility];  Service: General;  Laterality: N/A;  ? INCISION AND DRAINAGE PERIRECTAL ABSCESS Left 03/22/2018  ? Procedure: IRRIGATION AND DEBRIDEMENT PERIRECTAL ABSCESS;  Surgeon: Jimmye Norman, MD;  Location: [facility] OR;  Service: General;  Laterality: Left;  ? INCISION AND DRAINAGE PERIRECTAL ABSCESS N/A 07/31/2018  ? Procedure: IRRIGATION AND DEBRIDEMENT PERIRECTAL ABSCESS;  Surgeon: Jimmye Norman, MD;  Location: [facility] OR;  Service: General;  Laterality: N/A;  ? PLACEMENT OF SETON N/A 09/11/2018  ? Procedure: PLACEMENT OF SETON;  Surgeon: Andria Meuse, MD;  [facility]  Service: General;  Laterality: N/A;  ? RECTAL EXAM UNDER ANESTHESIA N/A 11/20/2018  ? Procedure: ANORECTAL EXAM UNDER ANESTHESIA;  Surgeon: Andria Meuse, MD;  Location: [facility];  Service: General;  Laterality: N/A;  ? RECTAL EXAM UNDER ANESTHESIA N/A 02/14/2020  ? Procedure: ANORECTAL EXAM UNDER ANESTHESIA;  Surgeon: Andria Meuse, MD;  Location: [facility];  Service: General;  Laterality: N/A;  ? TRANSTHORACIC ECHOCARDIOGRAM  11/29/2011  ? moderate LVH, ef 55-60%,  grade 2 diastolic dysfunction/  trivial MR and TR/  mild LAE  ? ? ?  Family History  ?Problem Relation Age of Onset  ? Hypertension Mother   ? Hypertension Father   ? Cancer Sister 30  ?     brain tumor  ? Stroke Sister   ? ? ?Social History  ? ?Socioeconomic History  ? Marital status: Married  ?  Spouse name: Not on file  ? Number of children: Not on file  ? Years of education: Not on file  ? Highest education level: Not on file  ?Occupational History  ? Not on file  ?Tobacco Use  ?  Smoking status: Former  ?  Packs/day: 0.50  ?  Years: 15.00  ?  Pack years: 7.50  ?  Types: Cigarettes  ?  Quit date: 2010  ?  Years since quitting: 13.2  ? Smokeless tobacco: Never  ?Vaping Use  ? Vaping Use: Never used  ?Substance and Sexual Activity  ? Alcohol use: Yes  ?  Alcohol/week: 24.0 standard drinks  ?  Types: 24 Cans of beer per week  ?  Comment: 2 beers a day  ? Drug use: Yes  ?  Types: Marijuana  ?  Comment: 02-08-2020 "2- times/wk"  ? Sexual activity: Yes  ?Other Topics Concern  ? Not on file  ?Social History Narrative  ? Not on file  ? ?Social Determinants of Health  ? ?Financial Resource Strain: Not on file  ?Food Insecurity: Not on file  ?Transportation Needs: Not on file  ?Physical Activity: Not on file  ?Stress: Not on file  ?Social Connections: Not on file  ?Intimate Partner Violence: Not on file  ? ? ?Objective:  ? ?Today's Vitals: BP 134/60   Pulse 72   Temp 97.8 ?F (36.6 ?C)   Ht 6' (1.829 m)   Wt 294 lb 14.4 oz (133.8 kg)   SpO2 99%   BMI 40.00 kg/m?  ? ?Physical Exam ? ?46 year old male in no acute distress ?Cardiovascular exam with regular rate and rhythm ?Lungs clear to auscultation bilaterally ? ?Assessment & Plan:  ? ?Problem List Items Addressed This Visit   ? ?  ? Cardiovascular and Mediastinum  ? Essential hypertension, benign - Primary  ?  Blood pressure control adequate in office today ?Continue with amlodipine and atenolol ?Recommend intermittent monitoring of blood pressure at home, DASH diet, regular aerobic exercise ?  ?  ? Relevant Medications  ? amLODipine (NORVASC) 10 MG tablet  ? atenolol (TENORMIN) 50 MG tablet  ?  ? Other  ? Foot swelling  ?  Area over lateral aspect of right foot appears consistent with possible ganglion cyst.  Will refer to podiatry for further evaluation and recommendations, considerations may include aspiration versus excision ?  ?  ? Relevant Orders  ? Ambulatory referral to Podiatry  ? ?Other Visit Diagnoses   ? ? Screening for colon cancer       ? Relevant Orders  ? Ambulatory referral to Gastroenterology  ? Wellness examination      ? Relevant Orders  ? CBC with Differential/Platelet  ? Comprehensive metabolic panel  ? Hemoglobin A1c  ? Lipid panel  ? TSH Rfx on Abnormal to Free T4  ? ?  ? ? ?Outpatient Encounter Medications as of 03/17/2022  ?Medication Sig  ? [DISCONTINUED] amLODipine (NORVASC) 10 MG tablet TAKE 1 TABLET(10 MG) BY MOUTH EVERY MORNING (Patient taking differently: Take 10 mg by mouth daily.)  ? [DISCONTINUED] atenolol (TENORMIN) 50 MG tablet TAKE 1 TABLET(50 MG) BY MOUTH EVERY MORNING (Patient taking differently: Take 50 mg  by mouth daily.)  ? amLODipine (NORVASC) 10 MG tablet Take 1 tablet (10 mg total) by mouth daily.  ? atenolol (TENORMIN) 50 MG tablet Take 1 tablet (50 mg total) by mouth daily.  ? [DISCONTINUED] amoxicillin-clavulanate (AUGMENTIN) 875-125 MG tablet Take 1 tablet by mouth every 12 (twelve) hours.  ? [DISCONTINUED] chlorhexidine (HIBICLENS) 4 % external liquid Apply topically daily as needed.  ? [DISCONTINUED] HYDROcodone-acetaminophen (NORCO/VICODIN) 5-325 MG tablet Take 1 tablet by mouth every 4 (four) hours as needed.  ? [DISCONTINUED] ibuprofen (ADVIL,MOTRIN) 200 MG tablet Take 1-4 tablets (200-800 mg total) by mouth every 6 (six) hours as needed (for pain). (Patient taking differently: Take 600-800 mg by mouth every 6 (six) hours as needed for headache or moderate pain. )  ? [DISCONTINUED] lidocaine (XYLOCAINE) 5 % ointment Apply 1 application topically daily as needed for moderate pain.  ? ?No facility-administered encounter medications on file as of 03/17/2022.  ? ? ?Follow-up: Return in about 2 months (around 05/17/2022) for CPE with FBW one day before.  ? ?Etha Stambaugh J De [place], MD ? ?

## 2022-03-30 ENCOUNTER — Ambulatory Visit: Payer: 59 | Admitting: Podiatrist

## 2022-03-30 ENCOUNTER — Encounter: Payer: Self-pay | Admitting: Podiatrist

## 2022-03-30 ENCOUNTER — Ambulatory Visit (INDEPENDENT_AMBULATORY_CARE_PROVIDER_SITE_OTHER): Payer: 59

## 2022-03-30 DIAGNOSIS — M7989 Other specified soft tissue disorders: Secondary | ICD-10-CM | POA: Diagnosis not present

## 2022-03-30 DIAGNOSIS — M6740 Ganglion, unspecified site: Secondary | ICD-10-CM

## 2022-03-30 NOTE — Patient Instructions (Signed)
Ganglion Cyst  A ganglion cyst is a non-cancerous, fluid-filled lump of tissue that occurs near a joint, tendon, or ligament. The cyst grows out of a joint or the lining of a tendon or ligament. Ganglion cysts most often develop in the hand or wrist, but they can also develop in the shoulder, elbow, hip, knee, ankle, or foot. Ganglion cysts are ball-shaped or egg-shaped. Their size can range from the size of a pea to larger than a grape. Increased activity may cause the cyst to get bigger because more fluid starts to build up. What are the causes? The exact cause of this condition is not known, but it may be related to: Inflammation or irritation around the joint. An injury or tear in the layers of tissue around the joint (joint capsule). Repetitive movements or overuse. History of acute or repeated injury. What increases the risk? You are more likely to develop this condition if: You are a male. You are 20-40 years old. What are the signs or symptoms? The main symptom of this condition is a lump. It most often appears on the hand or wrist. In many cases, there are no other symptoms, but a cyst can sometimes cause: Tingling. Pain or tenderness. Numbness. Weakness or loss of strength in the affected joint. Decreased range of motion in the affected area of the body. How is this diagnosed? Ganglion cysts are usually diagnosed based on a physical exam. Your health care provider will feel the lump and may shine a light next to it. If it is a ganglion cyst, the light will likely shine through it. Your health care provider may order an X-ray, ultrasound, MRI, or CT scan to rule out other conditions. How is this treated? Ganglion cysts often go away on their own without treatment. If you have pain or other symptoms, treatment may be needed. Treatment is also needed if the ganglion cyst limits your movement or if it gets infected. Treatment may include: Wearing a brace or splint on your wrist or  finger. Taking anti-inflammatory medicine. Having fluid drained from the lump with a needle (aspiration). Getting an injection of medicine into the joint to decrease inflammation. This may be corticosteroids, ethanol, or hyaluronidase. Having surgery to remove the ganglion cyst. Placing a pad in your shoe or wearing shoes that will not rub against the cyst if it is on your foot. Follow these instructions at home: Do not press on the ganglion cyst, poke it with a needle, or hit it. Take over-the-counter and prescription medicines only as told by your health care provider. If you have a brace or splint: Wear it as told by your health care provider. Remove it as told by your health care provider. Ask if you need to remove it when you take a shower or a bath. Watch your ganglion cyst for any changes. Keep all follow-up visits as told by your health care provider. This is important. Contact a health care provider if: Your ganglion cyst becomes larger or more painful. You have pus coming from the lump. You have weakness or numbness in the affected area. You have a fever or chills. Get help right away if: You have a fever and have any of these in the cyst area: Increased redness. Red streaks. Swelling. Summary A ganglion cyst is a non-cancerous, fluid-filled lump that occurs near a joint, tendon, or ligament. Ganglion cysts most often develop in the hand or wrist, but they can also develop in the shoulder, elbow, hip, knee, ankle, or   foot. Ganglion cysts often go away on their own without treatment. This information is not intended to replace advice given to you by your health care provider. Make sure you discuss any questions you have with your health care provider. Document Revised: 02/20/2020 Document Reviewed: 02/20/2020 Elsevier Patient Education  2023 Elsevier Inc.  

## 2022-03-30 NOTE — Progress Notes (Signed)
?Chief Complaint  ?Patient presents with  ? Foot Swelling  ?   Foot swelling Referring Provider: DE Peru, RAYMOND J  ?  ? ?HPI: Patient is 46 y.o. male who presents today for painful area of swelling on the right foot.  He states that he hit the foot in the past with a pallet jack and that may have caused the area to arise.  He has tried no treatment for the swelling.  And relates minimal pain. ? ?Patient Active Problem List  ? Diagnosis Date Noted  ? Foot swelling 03/17/2022  ? Erectile dysfunction due to diseases classified elsewhere 07/30/2019  ? Scoliosis of thoracic spine 07/30/2019  ? Chronic right-sided thoracic back pain 04/13/2019  ? Perianal abscess 07/30/2018  ? Skin rash 05/22/2018  ? Peri-rectal abscess 03/23/2018  ? Rectal abscess 03/22/2018  ? Perirectal abscess 03/22/2018  ? Palpitations 07/11/2017  ? Alcohol use 07/11/2017  ? Allergic rhinitis 06/24/2015  ? Morbid obesity (HCC) 04/30/2013  ? Snoring 04/30/2013  ? Hidradenitis 09/04/2012  ? Essential hypertension, benign 03/10/2009  ? ? ?Current Outpatient Medications on File Prior to Visit  ?Medication Sig Dispense Refill  ? amLODipine (NORVASC) 10 MG tablet Take 1 tablet (10 mg total) by mouth daily. 90 tablet 1  ? atenolol (TENORMIN) 50 MG tablet Take 1 tablet (50 mg total) by mouth daily. 90 tablet 1  ? ?No current facility-administered medications on file prior to visit.  ? ? ?No Known Allergies ? ?Review of Systems ?No fevers, chills, nausea, muscle aches, no difficulty breathing, no calf pain, no chest pain or shortness of breath. ? ? ?Physical Exam ? ?GENERAL APPEARANCE: Alert, conversant. Appropriately groomed. No acute distress.  ? ?VASCULAR: Pedal pulses palpable DP and PT bilateral.  Capillary refill time is immediate to all digits,  Proximal to distal cooling it warm to warm.  Digital perfusion adequate.  ? ?NEUROLOGIC: sensation is intact to 5.07 monofilament at 5/5 sites bilateral.  Light touch is intact bilateral, vibratory sensation  intact bilateral ? ?MUSCULOSKELETAL: acceptable muscle strength, tone and stability bilateral.  No gross boney pedal deformities noted.  No pain, crepitus or limitation noted with foot and ankle range of motion bilateral.  ? ?DERMATOLOGIC: skin is warm, supple, and dry.   ? ?Well-circumscribed movable lesion is present on the lateral aspect of the right foot at the area of the calcaneocuboid.  It appears to be a ganglion and measures approximately 1.5 cm in diameter. ? ?X-ray evaluation 3 views of the right foot are obtained.  No osseous abnormalities are seen.  No cystic changes within the bony structure are noted.  Fracture or dislocation is noted. ? ? ?Assessment  ? ?  ICD-10-CM   ?1. Palpable mass of soft tissue of foot  M79.89 DG Foot Complete Right  ?  ?2. Ganglion  M67.40   ?  ? ? ? ?Plan ? ?Discussed exam and x-ray findings with the patient.  Discussed this looks very much like a ganglion cyst and that these can arise from an area of trauma.  I recommended draining the cyst to be sure and the patient agreed I prepped the skin with alcohol and infiltrated 2% lidocaine plain in a V like block to anesthetize the cyst itself.  The cyst was then prepped with Betadine and I used a 10 ml syringe with a 18-gauge needle to remove the gelatinous fluid from the cyst the fluid was consistent with that of a ganglion cyst.  I applied a compressive dressing to  the area and discussed with the patient that the cysts have been known to regrow.  If that happens we can either drain it again or we can consider surgically removing the cyst and the stalk.  The patient will call if the cyst returns or if any problems or concerns arise in the future and otherwise he will be seen back as needed for follow-up. ?

## 2022-04-17 IMAGING — CT CT PELVIS W/ CM
2 of 3 series · 16 of 46 positions shown, 18 images · IV contrast (omnipaque)
Comparison: 01/21/2019, 09/19/2018, 07/30/2018

CLINICAL DATA: Rectal abscess

EXAM:
CT PELVIS WITH CONTRAST
TECHNIQUE: Multidetector CT imaging of the pelvis was performed using the
standard protocol following the bolus administration of intravenous
contrast.
CONTRAST:  100mL OMNIPAQUE IOHEXOL 300 MG/ML  SOLN

[Series 2: axial st · axial · 0.98mm/px · z∈[-709,-404]mm · 13 of 71 slices shown, 15 images]
[im 5/71  soft-tissue]
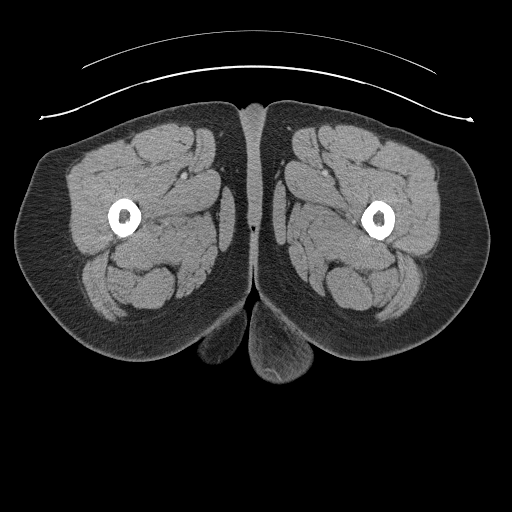
[im 5/71  bone]
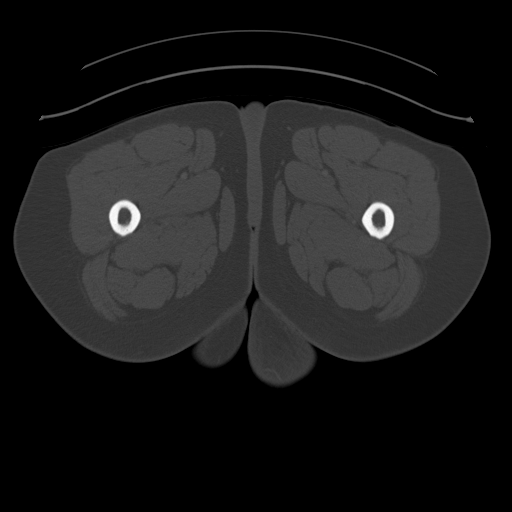
[im 10/71  soft-tissue]
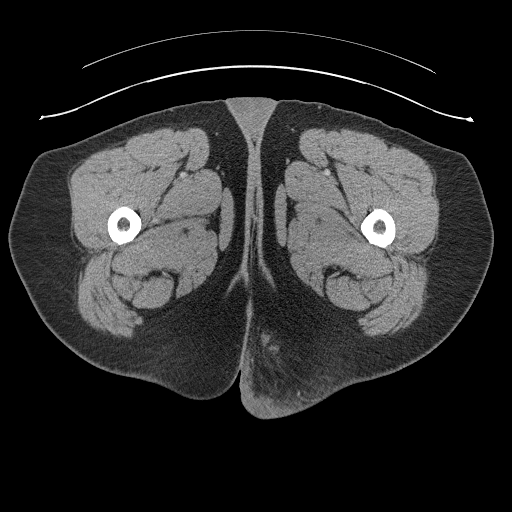
[im 14/71  soft-tissue]
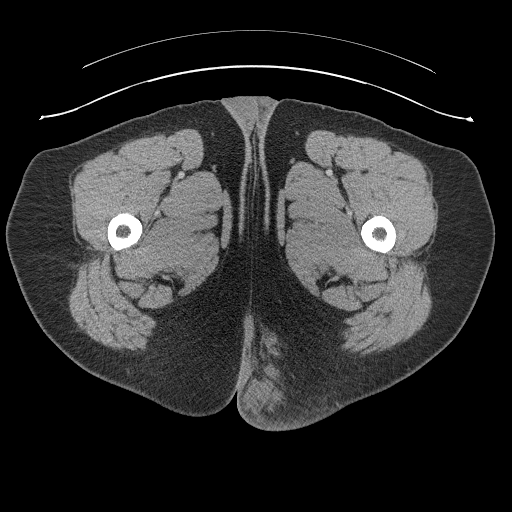
[im 21/71  soft-tissue]
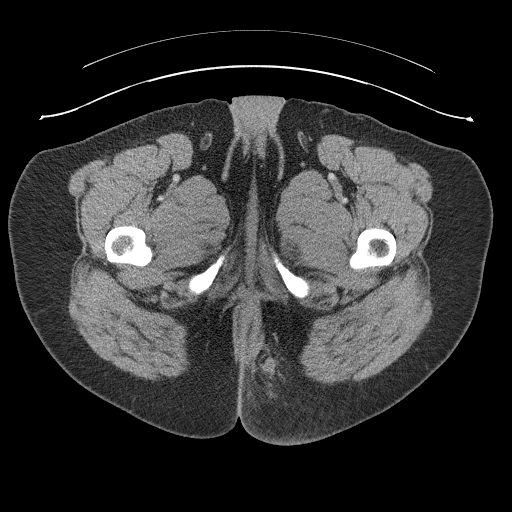
[im 25/71  soft-tissue]
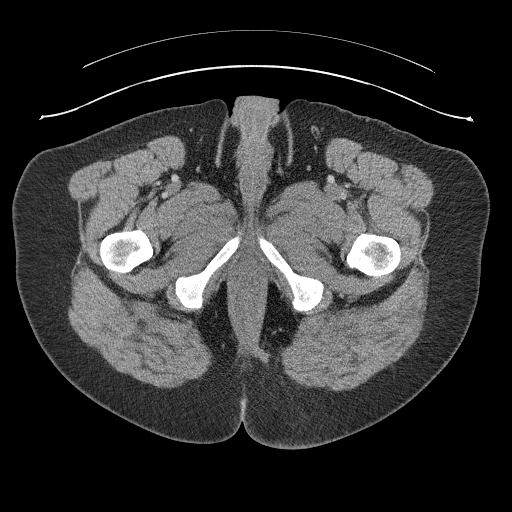
[im 30/71  soft-tissue]
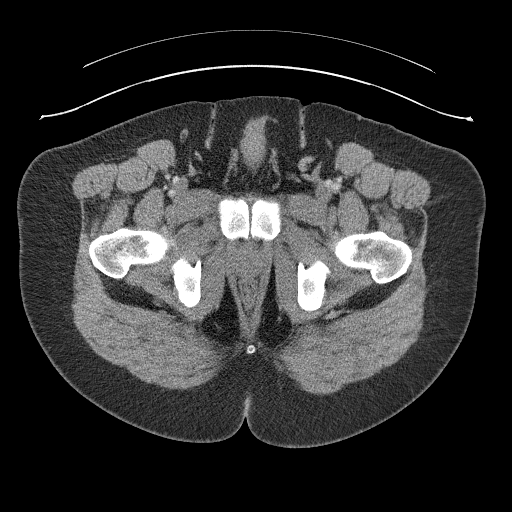
[im 37/71  soft-tissue]
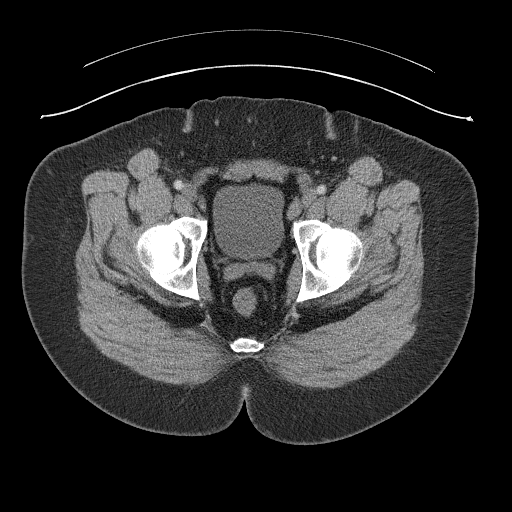
[im 41/71  soft-tissue]
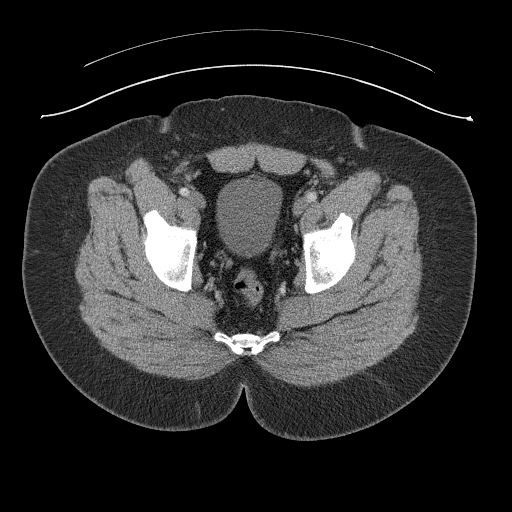
[im 46/71  soft-tissue]
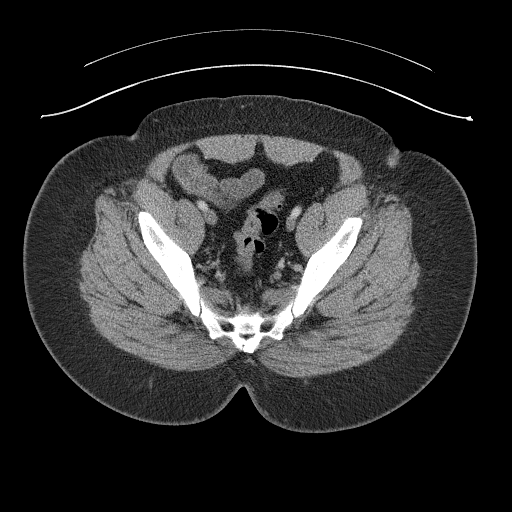
[im 46/71  bone]
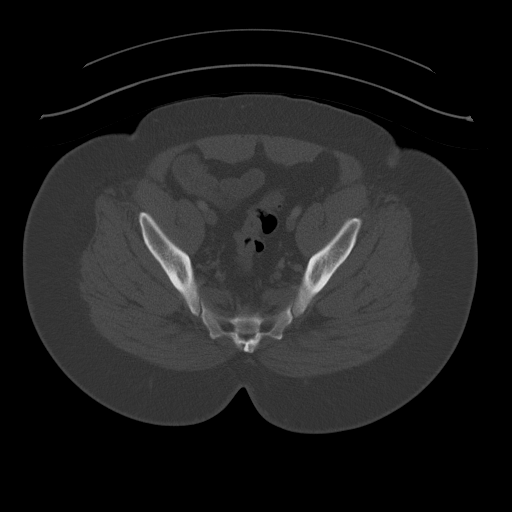
[im 50/71  soft-tissue]
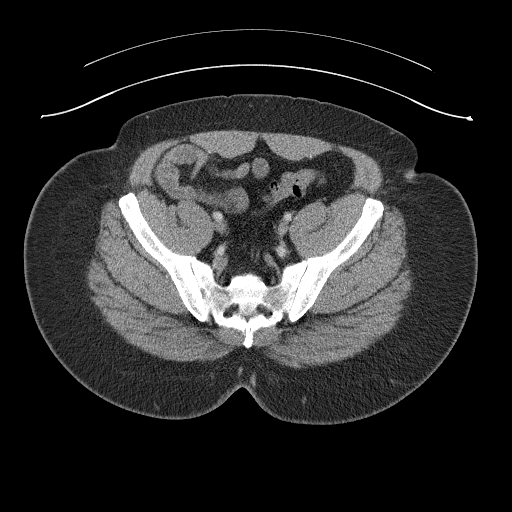
[im 57/71  soft-tissue]
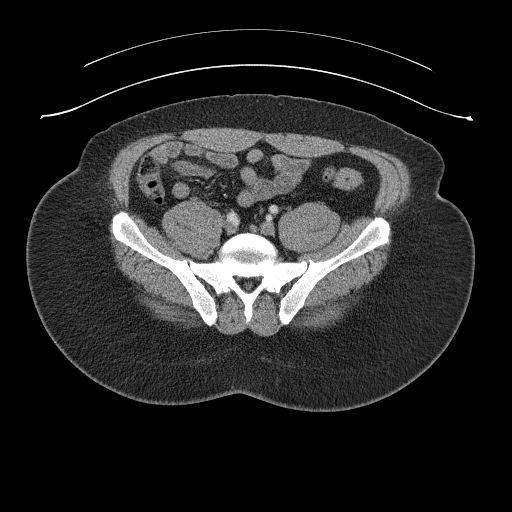
[im 61/71  soft-tissue]
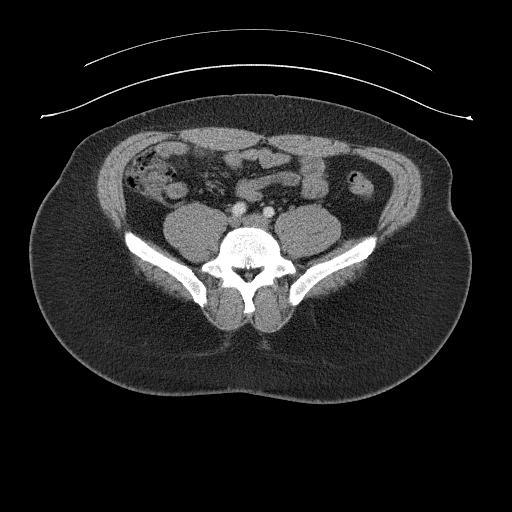
[im 66/71  soft-tissue]
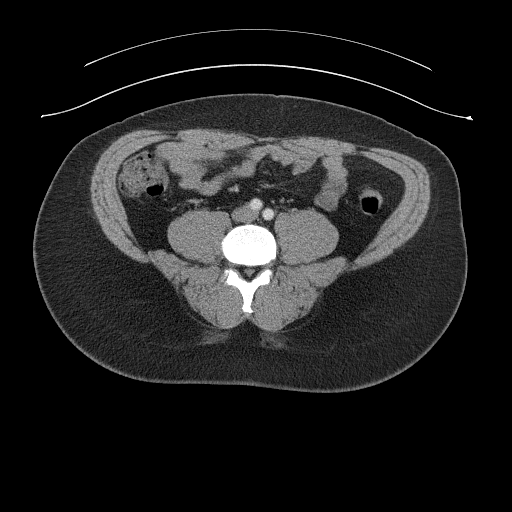

[Series 8: coronal st · coronal · 0.69mm/px · 3 of 177 slices shown]
[im 59/177  soft-tissue]
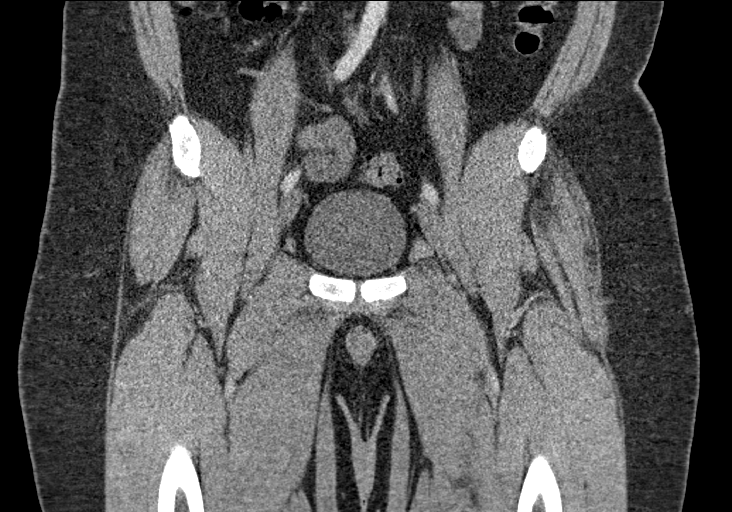
[im 79/177  soft-tissue]
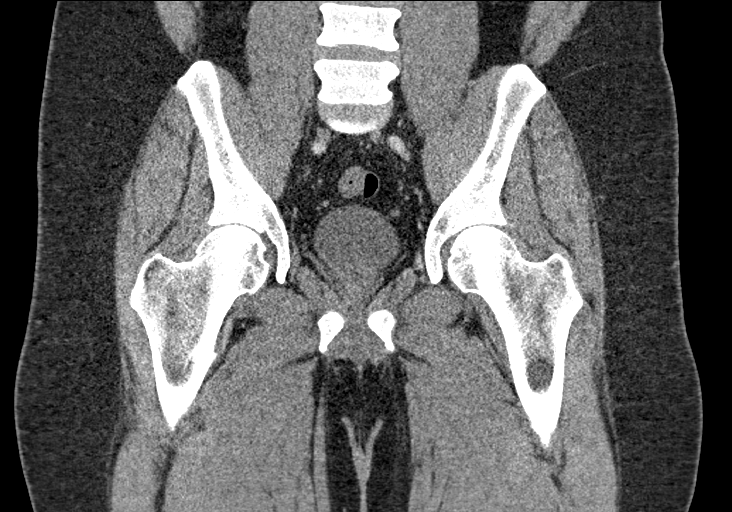
[im 98/177  soft-tissue]
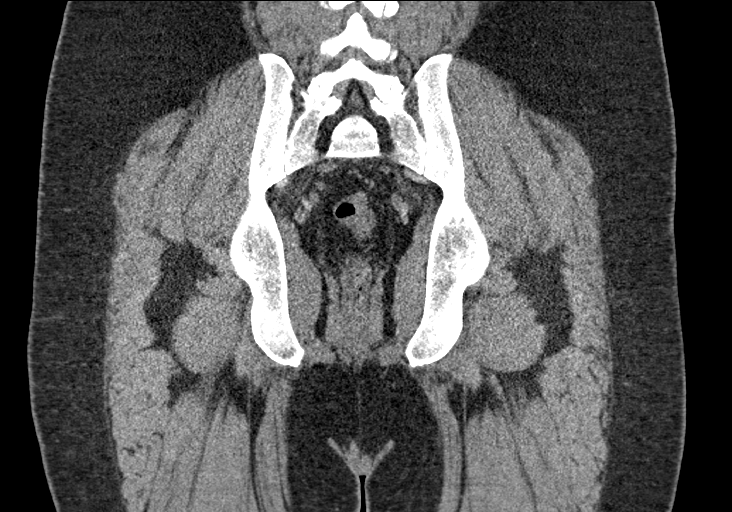

[16 of 46 positions shown; findings below may reference images not displayed]

FINDINGS: Urinary Tract:  No abnormality visualized.

Bowel:  Unremarkable visualized pelvic bowel loops.

Vascular/Lymphatic: No pathologically enlarged lymph nodes. No
significant vascular abnormality seen.

Reproductive:  No mass or other significant abnormality

Other:  No significant pelvic effusion.

Skin thickening along the medial left gluteal fold. Infiltration of
the left gluteal fat consistent with cellulitis. Inflammatory mass
within the subcutaneous soft tissues of the left gluteal region
measuring 4.5 cm craniocaudad by 4.2 cm AP x 3.4 cm transverse.
Linear soft tissue density extending from left posterior perianal
region towards the inflammatory changes in the sub gluteal fat
suspect for fistula.

Musculoskeletal: No acute osseous abnormality.
IMPRESSION: 1. Skin thickening of the medial left gluteal fold with infiltration
of the subcutaneous fat consistent with cellulitis. Ill-defined
focal soft tissue density without internal gas or strong rim
enhancement within the subcutaneous fat of the medial gluteal region
measuring up to 4.5 cm either reflecting phlegmon or developing
abscess. Area of linear soft tissue density extending from the left
posterior perianal region towards the inflammatory changes in the
gluteal subcutaneous fat, potentially representing a fistula.

## 2022-05-31 DIAGNOSIS — K6289 Other specified diseases of anus and rectum: Secondary | ICD-10-CM | POA: Diagnosis not present

## 2022-06-16 ENCOUNTER — Encounter (HOSPITAL_BASED_OUTPATIENT_CLINIC_OR_DEPARTMENT_OTHER): Payer: Self-pay | Admitting: Family Medicine

## 2022-06-16 ENCOUNTER — Ambulatory Visit (INDEPENDENT_AMBULATORY_CARE_PROVIDER_SITE_OTHER): Payer: 59 | Admitting: Family Medicine

## 2022-06-16 DIAGNOSIS — Z0000 Encounter for general adult medical examination without abnormal findings: Secondary | ICD-10-CM | POA: Diagnosis not present

## 2022-06-16 NOTE — Progress Notes (Signed)
Subjective:    CC: Annual Physical Exam  HPI:  Martin Rose is a 46 y.o. presenting for annual physical  I reviewed the past medical history, family history, social history, surgical history, and allergies today and no changes were needed.  Please see the problem list section below in epic for further details.  Past Medical History: Past Medical History:  Diagnosis Date   History of rectal abscess    Hypertension    Palpitations 07/11/2017   per pt only feels when he over exerts himself without sob   Perirectal fistula    wound/  left anterior   Past Surgical History: Past Surgical History:  Procedure Laterality Date   ANAL FISTULOTOMY N/A 11/20/2018   Procedure: FISTULOTOMY placement cutting seton;  Surgeon: Andria Meuse, MD;  Location: [facility];  Service: General;  Laterality: N/A;   CARDIAC CATHETERIZATION  07-20-2006   dr Jenne Campus   abnormal cardiolite--  no sig. cad, normal LVF, ef 60%, systemic HTN, elevated LVEDP (22)   FISTULOTOMY N/A 09/11/2018   Procedure: FISTULOTOMY;  Surgeon: Andria Meuse, MD;  Location: [facility];  Service: General;  Laterality: N/A;   FISTULOTOMY N/A 02/14/2020   Procedure: INCISION AND DRAINAGE OF PERIANAL COLLECTION;  Surgeon: Andria Meuse, MD;  Location: [facility];  Service: General;  Laterality: N/A;   INCISION AND DRAINAGE PERIRECTAL ABSCESS Left 03/22/2018   Procedure: IRRIGATION AND DEBRIDEMENT PERIRECTAL ABSCESS;  Surgeon: Jimmye Norman, MD;  Location: [facility];  Service: General;  Laterality: Left;   INCISION AND DRAINAGE PERIRECTAL ABSCESS N/A 07/31/2018   Procedure: IRRIGATION AND DEBRIDEMENT PERIRECTAL ABSCESS;  Surgeon: Jimmye Norman, MD;  Location: [facility];  Service: General;  Laterality: N/A;   PLACEMENT OF SETON N/A 09/11/2018   Procedure: PLACEMENT OF SETON;  Surgeon: Andria Meuse, MD;  Location: [facility];  Service: General;  Laterality: N/A;   RECTAL EXAM UNDER  ANESTHESIA N/A 11/20/2018   Procedure: ANORECTAL EXAM UNDER ANESTHESIA;  Surgeon: Andria Meuse, MD;  Location: [facility];  Service: General;  Laterality: N/A;   RECTAL EXAM UNDER ANESTHESIA N/A 02/14/2020   Procedure: ANORECTAL EXAM UNDER ANESTHESIA;  Surgeon: Andria Meuse, MD;  [facility];  Service: General;  Laterality: N/A;   TRANSTHORACIC ECHOCARDIOGRAM  11/29/2011   moderate LVH, ef 55-60%,  grade 2 diastolic dysfunction/  trivial MR and TR/  mild LAE   Social History: Social History   Socioeconomic History   Marital status: Married    Spouse name: Not on file   Number of children: Not on file   Years of education: Not on file   Highest education level: Not on file  Occupational History   Not on file  Tobacco Use   Smoking status: Former    Packs/day: 0.50    Years: 15.00    Total pack years: 7.50    Types: Cigarettes    Quit date: 2010    Years since quitting: 13.5   Smokeless tobacco: Never  Vaping Use   Vaping Use: Never used  Substance and Sexual Activity   Alcohol use: Yes    Alcohol/week: 24.0 standard drinks of alcohol    Types: 24 Cans of beer per week    Comment: 2 beers a day   Drug use: Yes    Types: Marijuana    Comment: 02-08-2020 "2- times/wk"   Sexual activity: Yes  Other Topics Concern   Not on file  Social History Narrative  Not on file   Social Determinants of Health   Financial Resource Strain: Not on file  Food Insecurity: Not on file  Transportation Needs: Not on file  Physical Activity: Not on file  Stress: Not on file  Social Connections: Not on file   Family History: Family History  Problem Relation Age of Onset   Hypertension Mother    Hypertension Father    Cancer Sister 30       brain tumor   Stroke Sister    Allergies: No Known Allergies Medications: See med rec.  Review of Systems: No headache, visual changes, nausea, vomiting, diarrhea, constipation, dizziness, abdominal pain, skin  rash, fevers, chills, night sweats, swollen lymph nodes, weight loss, chest pain, body aches, joint swelling, muscle aches, shortness of breath, mood changes, visual or auditory hallucinations.  Objective:    BP 133/79   Pulse 63   Temp 97.6 F (36.4 C) (Oral)   Ht 6' (1.829 m)   Wt 288 lb 14.4 oz (131 kg)   SpO2 100%   BMI 39.18 kg/m   General: Well Developed, well nourished, and in no acute distress.  Neuro: Alert and oriented x3, extra-ocular muscles intact, sensation grossly intact. Cranial nerves II through XII are intact, motor, sensory, and coordinative functions are all intact. HEENT: Normocephalic, atraumatic, pupils equal round reactive to light, neck supple, no masses, no lymphadenopathy, thyroid nonpalpable. Oropharynx, nasopharynx, external ear canals are unremarkable. Skin: Warm and dry, no rashes noted.  Cardiac: Regular rate and rhythm, no murmurs rubs or gallops.  Respiratory: Clear to auscultation bilaterally. Not using accessory muscles, speaking in full sentences.  Abdominal: Soft, nontender, nondistended, positive bowel sounds, no masses, no organomegaly.  Musculoskeletal: Shoulder, elbow, wrist, hip, knee, ankle stable, and with full range of motion.  Impression and Recommendations:    Wellness examination Routine HCM labs ordered. HCM reviewed/discussed. Anticipatory guidance regarding healthy weight, lifestyle and choices given. Recommend healthy diet.  Recommend approximately 150 minutes/week of moderate intensity exercise Recommend regular dental and vision exams Always use seatbelt/lap and shoulder restraints Recommend using smoke alarms and checking batteries at least twice a year Recommend using sunscreen when outside Discussed tetanus immunization recommendations, patient reports being UTD, HM updated Previously referred to gastroenterology to arrange for colonoscopy, provided patient with office information today so that he may call and  schedule  Return in about 4 months (around 10/17/2022) for HTN.   ___________________________________________ Erva Koke de [place], MD, ABFM, CAQSM [facility]

## 2022-06-16 NOTE — Assessment & Plan Note (Addendum)
Routine HCM labs ordered. HCM reviewed/discussed. Anticipatory guidance regarding healthy weight, lifestyle and choices given. Recommend healthy diet.  Recommend approximately 150 minutes/week of moderate intensity exercise Recommend regular dental and vision exams Always use seatbelt/lap and shoulder restraints Recommend using smoke alarms and checking batteries at least twice a year Recommend using sunscreen when outside Discussed tetanus immunization recommendations, patient reports being UTD, HM updated Previously referred to gastroenterology to arrange for colonoscopy, provided patient with office information today so that he may call and schedule

## 2022-06-22 ENCOUNTER — Ambulatory Visit (HOSPITAL_BASED_OUTPATIENT_CLINIC_OR_DEPARTMENT_OTHER): Payer: 59

## 2022-06-22 DIAGNOSIS — Z0000 Encounter for general adult medical examination without abnormal findings: Secondary | ICD-10-CM | POA: Diagnosis not present

## 2022-06-23 ENCOUNTER — Telehealth (HOSPITAL_BASED_OUTPATIENT_CLINIC_OR_DEPARTMENT_OTHER): Payer: Self-pay

## 2022-06-23 LAB — CBC WITH DIFFERENTIAL/PLATELET
Basophils Absolute: 0.1 10*3/uL (ref 0.0–0.2)
Basos: 1 %
EOS (ABSOLUTE): 0.4 10*3/uL (ref 0.0–0.4)
Eos: 4 %
Hematocrit: 41.3 % (ref 37.5–51.0)
Hemoglobin: 13.4 g/dL (ref 13.0–17.7)
Immature Grans (Abs): 0 10*3/uL (ref 0.0–0.1)
Immature Granulocytes: 0 %
Lymphocytes Absolute: 3.8 10*3/uL — ABNORMAL HIGH (ref 0.7–3.1)
Lymphs: 41 %
MCH: 26.5 pg — ABNORMAL LOW (ref 26.6–33.0)
MCHC: 32.4 g/dL (ref 31.5–35.7)
MCV: 82 fL (ref 79–97)
Monocytes Absolute: 0.7 10*3/uL (ref 0.1–0.9)
Monocytes: 8 %
Neutrophils Absolute: 4.3 10*3/uL (ref 1.4–7.0)
Neutrophils: 46 %
Platelets: 290 10*3/uL (ref 150–450)
RBC: 5.05 x10E6/uL (ref 4.14–5.80)
RDW: 14.2 % (ref 11.6–15.4)
WBC: 9.3 10*3/uL (ref 3.4–10.8)

## 2022-06-23 LAB — COMPREHENSIVE METABOLIC PANEL
ALT: 25 IU/L (ref 0–44)
AST: 18 IU/L (ref 0–40)
Albumin/Globulin Ratio: 1.6 (ref 1.2–2.2)
Albumin: 4.3 g/dL (ref 4.1–5.1)
Alkaline Phosphatase: 80 IU/L (ref 44–121)
BUN/Creatinine Ratio: 12 (ref 9–20)
BUN: 13 mg/dL (ref 6–24)
Bilirubin Total: 0.4 mg/dL (ref 0.0–1.2)
CO2: 23 mmol/L (ref 20–29)
Calcium: 9.6 mg/dL (ref 8.7–10.2)
Chloride: 104 mmol/L (ref 96–106)
Creatinine, Ser: 1.08 mg/dL (ref 0.76–1.27)
Globulin, Total: 2.7 g/dL (ref 1.5–4.5)
Glucose: 91 mg/dL (ref 70–99)
Potassium: 4.9 mmol/L (ref 3.5–5.2)
Sodium: 140 mmol/L (ref 134–144)
Total Protein: 7 g/dL (ref 6.0–8.5)
eGFR: 86 mL/min/{1.73_m2} (ref >59)

## 2022-06-23 LAB — HEMOGLOBIN A1C
Est. average glucose Bld gHb Est-mCnc: 126 mg/dL
Hgb A1c MFr Bld: 6 % — ABNORMAL HIGH (ref 4.8–5.6)

## 2022-06-23 LAB — TSH RFX ON ABNORMAL TO FREE T4: TSH: 2.25 u[IU]/mL (ref 0.450–4.500)

## 2022-06-23 LAB — LIPID PANEL
Chol/HDL Ratio: 2.2 ratio (ref 0.0–5.0)
Cholesterol, Total: 131 mg/dL (ref 100–199)
HDL: 59 mg/dL (ref >39)
LDL Chol Calc (NIH): 54 mg/dL (ref 0–99)
Triglycerides: 98 mg/dL (ref 0–149)
VLDL Cholesterol Cal: 18 mg/dL (ref 5–40)

## 2022-06-23 NOTE — Telephone Encounter (Signed)
Spoke with [employer]. Office requires medical records from previous GI provider with [facility]. Called patient to instruct him of requirement to schedule an appt with [place].

## 2022-06-29 DIAGNOSIS — K603 Anal fistula: Secondary | ICD-10-CM | POA: Diagnosis not present

## 2022-07-05 ENCOUNTER — Other Ambulatory Visit: Payer: Self-pay | Admitting: Surgery

## 2022-07-05 DIAGNOSIS — K603 Anal fistula: Secondary | ICD-10-CM

## 2022-08-25 ENCOUNTER — Ambulatory Visit (HOSPITAL_BASED_OUTPATIENT_CLINIC_OR_DEPARTMENT_OTHER): Payer: 59 | Admitting: Family Medicine

## 2022-08-30 ENCOUNTER — Other Ambulatory Visit (HOSPITAL_BASED_OUTPATIENT_CLINIC_OR_DEPARTMENT_OTHER): Payer: Self-pay

## 2022-08-30 DIAGNOSIS — I10 Essential (primary) hypertension: Secondary | ICD-10-CM

## 2022-08-30 MED ORDER — AMLODIPINE BESYLATE 10 MG PO TABS: 10.0000 mg | ORAL_TABLET | Freq: Every day | ORAL | 1 refills | Status: DC

## 2022-08-30 MED ORDER — ATENOLOL 50 MG PO TABS: 50.0000 mg | ORAL_TABLET | Freq: Every day | ORAL | 1 refills | Status: DC

## 2022-09-01 ENCOUNTER — Ambulatory Visit (HOSPITAL_BASED_OUTPATIENT_CLINIC_OR_DEPARTMENT_OTHER): Payer: 59 | Admitting: Family Medicine

## 2022-09-01 ENCOUNTER — Telehealth (HOSPITAL_BASED_OUTPATIENT_CLINIC_OR_DEPARTMENT_OTHER): Payer: Self-pay | Admitting: Family Medicine

## 2022-09-01 ENCOUNTER — Other Ambulatory Visit (HOSPITAL_BASED_OUTPATIENT_CLINIC_OR_DEPARTMENT_OTHER): Payer: Self-pay

## 2022-09-01 DIAGNOSIS — I10 Essential (primary) hypertension: Secondary | ICD-10-CM

## 2022-09-01 MED ORDER — AMLODIPINE BESYLATE 10 MG PO TABS: 10.0000 mg | ORAL_TABLET | Freq: Every day | ORAL | 1 refills | Status: DC

## 2022-09-01 MED ORDER — ATENOLOL 50 MG PO TABS: 50.0000 mg | ORAL_TABLET | Freq: Every day | ORAL | 1 refills | Status: DC

## 2022-09-01 NOTE — Telephone Encounter (Signed)
Please send both   amLODipine (NORVASC) 10 MG tablet  atenolol (TENORMIN) 50 MG tablet    To [facility] on [address]

## 2022-09-02 NOTE — Telephone Encounter (Signed)
Pt was called back and I was able to send his prescriptions off to the correct pharmacy. [facility]

## 2022-09-18 DIAGNOSIS — Z6839 Body mass index (BMI) 39.0-39.9, adult: Secondary | ICD-10-CM | POA: Diagnosis not present

## 2022-09-18 DIAGNOSIS — I10 Essential (primary) hypertension: Secondary | ICD-10-CM | POA: Diagnosis not present

## 2022-09-28 ENCOUNTER — Other Ambulatory Visit (HOSPITAL_COMMUNITY): Payer: Self-pay | Admitting: Surgery

## 2022-09-28 DIAGNOSIS — K603 Anal fistula: Secondary | ICD-10-CM

## 2022-09-28 DIAGNOSIS — K611 Rectal abscess: Secondary | ICD-10-CM | POA: Diagnosis not present

## 2022-09-30 ENCOUNTER — Other Ambulatory Visit: Payer: Self-pay | Admitting: Surgery

## 2022-09-30 ENCOUNTER — Other Ambulatory Visit: Payer: Self-pay

## 2022-09-30 ENCOUNTER — Encounter (HOSPITAL_COMMUNITY): Payer: Self-pay | Admitting: Emergency Medicine

## 2022-09-30 ENCOUNTER — Emergency Department (HOSPITAL_COMMUNITY)
Admission: EM | Admit: 2022-09-30 | Discharge: 2022-10-01 | Discharge status: Against Medical Advice | Payer: 59 | Attending: Student | Admitting: Student

## 2022-09-30 DIAGNOSIS — K603 Anal fistula, unspecified: Secondary | ICD-10-CM

## 2022-09-30 DIAGNOSIS — Z5321 Procedure and treatment not carried out due to patient leaving prior to being seen by health care provider: Secondary | ICD-10-CM | POA: Diagnosis not present

## 2022-09-30 DIAGNOSIS — L0231 Cutaneous abscess of buttock: Secondary | ICD-10-CM | POA: Insufficient documentation

## 2022-09-30 DIAGNOSIS — K611 Rectal abscess: Secondary | ICD-10-CM

## 2022-09-30 NOTE — ED Provider Triage Note (Signed)
Emergency Medicine Provider Triage Evaluation Note  Martin Rose , a 46 y.o. male  was evaluated in triage.  Pt complains of abscess to left buttock.  Patient states this was drained in office on Tuesday.  He was also placed on antibiotics.  He continues to have fluctuance and there are concerns about a possible rectal fistula.  Dr. Dema Severin has an anorectal exam under anesthesia scheduled for November 20 recommended that he come to the emergency department today for evaluation as the pain was severe.  He recommended a CT for evaluation.  He denies nausea, vomiting, fever  Review of Systems  Positive: As above Negative: As above  Physical Exam  BP (!) 141/86   Pulse 96   Temp 99.7 F (37.6 C) (Oral)   Resp 19   SpO2 100%  Gen:   Awake, no distress   Resp:  Normal effort  MSK:   Moves extremities without difficulty  Other:    Medical Decision Making  Medically screening exam initiated at 8:45 PM.  Appropriate orders placed.  Martin Rose was informed that the remainder of the evaluation will be completed by another provider, this initial triage assessment does not replace that evaluation, and the importance of remaining in the ED until their evaluation is complete.     Martin Peng, PA-C 09/30/22 2046

## 2022-09-30 NOTE — ED Triage Notes (Signed)
Patient reports non healing abscess at left buttocks onset last week .

## 2022-10-01 ENCOUNTER — Ambulatory Visit (HOSPITAL_COMMUNITY): Payer: 59

## 2022-10-01 ENCOUNTER — Ambulatory Visit
Admission: RE | Admit: 2022-10-01 | Discharge: 2022-10-01 | Disposition: A | Discharge status: Home / Self Care | Payer: 59 | Source: Ambulatory Visit | Attending: Surgery | Admitting: Surgery

## 2022-10-01 ENCOUNTER — Encounter (HOSPITAL_COMMUNITY): Payer: Self-pay

## 2022-10-01 DIAGNOSIS — K603 Anal fistula: Secondary | ICD-10-CM

## 2022-10-01 DIAGNOSIS — K611 Rectal abscess: Secondary | ICD-10-CM

## 2022-10-01 LAB — CBC WITH DIFFERENTIAL/PLATELET
Abs Immature Granulocytes: 0.07 10*3/uL (ref 0.00–0.07)
Basophils Absolute: 0.1 10*3/uL (ref 0.0–0.1)
Basophils Relative: 0 %
Eosinophils Absolute: 0.2 10*3/uL (ref 0.0–0.5)
Eosinophils Relative: 1 %
HCT: 39.2 % (ref 39.0–52.0)
Hemoglobin: 13.1 g/dL (ref 13.0–17.0)
Immature Granulocytes: 0 %
Lymphocytes Relative: 19 %
Lymphs Abs: 3.2 10*3/uL (ref 0.7–4.0)
MCH: 27.2 pg (ref 26.0–34.0)
MCHC: 33.4 g/dL (ref 30.0–36.0)
MCV: 81.3 fL (ref 80.0–100.0)
Monocytes Absolute: 1.3 10*3/uL — ABNORMAL HIGH (ref 0.1–1.0)
Monocytes Relative: 8 %
Neutro Abs: 11.5 10*3/uL — ABNORMAL HIGH (ref 1.7–7.7)
Neutrophils Relative %: 72 %
Platelets: 296 10*3/uL (ref 150–400)
RBC: 4.82 MIL/uL (ref 4.22–5.81)
RDW: 13.8 % (ref 11.5–15.5)
WBC: 16.3 10*3/uL — ABNORMAL HIGH (ref 4.0–10.5)
nRBC: 0 % (ref 0.0–0.2)

## 2022-10-01 LAB — BASIC METABOLIC PANEL
Anion gap: 11 (ref 5–15)
BUN: 10 mg/dL (ref 6–20)
CO2: 28 mmol/L (ref 22–32)
Calcium: 9.5 mg/dL (ref 8.9–10.3)
Chloride: 102 mmol/L (ref 98–111)
Creatinine, Ser: 1.25 mg/dL — ABNORMAL HIGH (ref 0.61–1.24)
GFR, Estimated: >60 mL/min (ref >60)
Glucose, Bld: 115 mg/dL — ABNORMAL HIGH (ref 70–99)
Potassium: 4.2 mmol/L (ref 3.5–5.1)
Sodium: 141 mmol/L (ref 135–145)

## 2022-10-01 MED ORDER — GADOPICLENOL 0.5 MMOL/ML IV SOLN
10.0000 mL | Freq: Once | INTRAVENOUS | Status: AC | PRN
  Administered 2022-10-01: 10 mL via INTRAVENOUS

## 2022-10-01 MED ORDER — ACETAMINOPHEN 325 MG PO TABS
650.0000 mg | ORAL_TABLET | Freq: Once | ORAL | Status: AC
  Administered 2022-10-01: 650 mg via ORAL
  Filled 2022-10-01: qty 2

## 2022-10-01 NOTE — ED Notes (Signed)
Pt stated that the pain was too much and he did not want to wait any longer. Pt was seen leaving the ED.

## 2022-10-02 ENCOUNTER — Ambulatory Visit: Payer: Self-pay | Admitting: Surgery

## 2022-10-02 ENCOUNTER — Telehealth: Payer: Self-pay | Admitting: Surgery

## 2022-10-02 DIAGNOSIS — L0231 Cutaneous abscess of buttock: Secondary | ICD-10-CM

## 2022-10-02 NOTE — Progress Notes (Addendum)
Unable to reach pt. Original number in chart is incorrect. Found [phone] in case posting. Unable to reach pt and did not leave message with instructions due to vm not identifying that it was the actual pt's vm. Left several generic messages for pt to call me back about procedure in morning.  Spoke with OR and made them aware.

## 2022-10-02 NOTE — Telephone Encounter (Signed)
I called the patient and confirmed his surgery time tomorrow morning. Instructed him to arrive at [facility] and check in through ED. Also instructed him to remain NPO after midnight tonight. Patient expressed understanding. Phone number has been corrected in chart.

## 2022-10-03 ENCOUNTER — Ambulatory Visit (HOSPITAL_COMMUNITY)
Admission: RE | Admit: 2022-10-03 | Discharge: 2022-10-03 | Disposition: A | Discharge status: Home / Self Care | Payer: 59 | Source: Ambulatory Visit | Attending: Surgery | Admitting: Surgery

## 2022-10-03 ENCOUNTER — Other Ambulatory Visit: Payer: Self-pay

## 2022-10-03 ENCOUNTER — Encounter (HOSPITAL_COMMUNITY)
Admission: RE | Disposition: A | Discharge status: Home / Self Care | Payer: Self-pay | Source: Ambulatory Visit | Attending: Surgery

## 2022-10-03 ENCOUNTER — Inpatient Hospital Stay (HOSPITAL_COMMUNITY): Payer: 59 | Admitting: Anesthesiology

## 2022-10-03 ENCOUNTER — Encounter (HOSPITAL_COMMUNITY): Payer: Self-pay | Admitting: Surgery

## 2022-10-03 DIAGNOSIS — K605 Anorectal fistula: Secondary | ICD-10-CM | POA: Diagnosis not present

## 2022-10-03 DIAGNOSIS — I10 Essential (primary) hypertension: Secondary | ICD-10-CM | POA: Insufficient documentation

## 2022-10-03 DIAGNOSIS — K604 Rectal fistula: Secondary | ICD-10-CM | POA: Diagnosis not present

## 2022-10-03 DIAGNOSIS — K603 Anal fistula, unspecified: Secondary | ICD-10-CM | POA: Diagnosis present

## 2022-10-03 DIAGNOSIS — K611 Rectal abscess: Secondary | ICD-10-CM | POA: Diagnosis not present

## 2022-10-03 DIAGNOSIS — L0231 Cutaneous abscess of buttock: Secondary | ICD-10-CM

## 2022-10-03 DIAGNOSIS — Z87891 Personal history of nicotine dependence: Secondary | ICD-10-CM | POA: Diagnosis not present

## 2022-10-03 HISTORY — PX: INCISION AND DRAINAGE PERIRECTAL ABSCESS: SHX1804

## 2022-10-03 SURGERY — INCISION AND DRAINAGE, ABSCESS, PERIRECTAL
Anesthesia: General | Site: Buttocks | Laterality: Left

## 2022-10-03 MED ORDER — TRAMADOL HCL 50 MG PO TABS: 50.0000 mg | ORAL_TABLET | Freq: Three times a day (TID) | ORAL | 0 refills | Status: AC | PRN

## 2022-10-03 MED ORDER — LACTATED RINGERS IV SOLN: INTRAVENOUS | Status: DC

## 2022-10-03 MED ORDER — PROPOFOL 10 MG/ML IV BOLUS
INTRAVENOUS | Status: AC
  Filled 2022-10-03: qty 20

## 2022-10-03 MED ORDER — ROCURONIUM BROMIDE 10 MG/ML (PF) SYRINGE
PREFILLED_SYRINGE | INTRAVENOUS | Status: AC
  Filled 2022-10-03: qty 10

## 2022-10-03 MED ORDER — SUCCINYLCHOLINE CHLORIDE 200 MG/10ML IV SOSY
PREFILLED_SYRINGE | INTRAVENOUS | Status: DC | PRN
  Administered 2022-10-03: 120 mg via INTRAVENOUS

## 2022-10-03 MED ORDER — ORAL CARE MOUTH RINSE: 15.0000 mL | Freq: Once | OROMUCOSAL | Status: AC

## 2022-10-03 MED ORDER — AMISULPRIDE (ANTIEMETIC) 5 MG/2ML IV SOLN: 10.0000 mg | Freq: Once | INTRAVENOUS | Status: DC | PRN

## 2022-10-03 MED ORDER — PROPOFOL 10 MG/ML IV BOLUS
INTRAVENOUS | Status: DC | PRN
  Administered 2022-10-03: 300 mg via INTRAVENOUS

## 2022-10-03 MED ORDER — FENTANYL CITRATE (PF) 100 MCG/2ML IJ SOLN
25.0000 ug | INTRAMUSCULAR | Status: DC | PRN
  Administered 2022-10-03: 50 ug via INTRAVENOUS

## 2022-10-03 MED ORDER — FENTANYL CITRATE (PF) 100 MCG/2ML IJ SOLN
INTRAMUSCULAR | Status: AC
  Filled 2022-10-03: qty 2

## 2022-10-03 MED ORDER — 0.9 % SODIUM CHLORIDE (POUR BTL) OPTIME
TOPICAL | Status: DC | PRN
  Administered 2022-10-03: 1000 mL

## 2022-10-03 MED ORDER — FENTANYL CITRATE (PF) 250 MCG/5ML IJ SOLN
INTRAMUSCULAR | Status: AC
  Filled 2022-10-03: qty 5

## 2022-10-03 MED ORDER — SODIUM CHLORIDE 0.9 % IV SOLN
2.0000 g | INTRAVENOUS | Status: AC
  Administered 2022-10-03: 2 g via INTRAVENOUS

## 2022-10-03 MED ORDER — ACETAMINOPHEN 500 MG PO TABS: 1000.0000 mg | ORAL_TABLET | ORAL | Status: DC

## 2022-10-03 MED ORDER — DEXAMETHASONE SODIUM PHOSPHATE 10 MG/ML IJ SOLN
INTRAMUSCULAR | Status: AC
  Filled 2022-10-03: qty 1

## 2022-10-03 MED ORDER — METRONIDAZOLE 500 MG/100ML IV SOLN
INTRAVENOUS | Status: AC
  Filled 2022-10-03: qty 100

## 2022-10-03 MED ORDER — ROCURONIUM BROMIDE 10 MG/ML (PF) SYRINGE
PREFILLED_SYRINGE | INTRAVENOUS | Status: DC | PRN
  Administered 2022-10-03: 50 mg via INTRAVENOUS

## 2022-10-03 MED ORDER — ONDANSETRON HCL 4 MG/2ML IJ SOLN
INTRAMUSCULAR | Status: AC
  Filled 2022-10-03: qty 2

## 2022-10-03 MED ORDER — SUCCINYLCHOLINE CHLORIDE 200 MG/10ML IV SOSY
PREFILLED_SYRINGE | INTRAVENOUS | Status: AC
  Filled 2022-10-03: qty 10

## 2022-10-03 MED ORDER — LIDOCAINE 2% (20 MG/ML) 5 ML SYRINGE
INTRAMUSCULAR | Status: AC
  Filled 2022-10-03: qty 10

## 2022-10-03 MED ORDER — CHLORHEXIDINE GLUCONATE 0.12 % MT SOLN
OROMUCOSAL | Status: AC
  Administered 2022-10-03: 15 mL via OROMUCOSAL
  Filled 2022-10-03: qty 15

## 2022-10-03 MED ORDER — FENTANYL CITRATE (PF) 250 MCG/5ML IJ SOLN
INTRAMUSCULAR | Status: DC | PRN
  Administered 2022-10-03: 50 ug via INTRAVENOUS
  Administered 2022-10-03: 100 ug via INTRAVENOUS

## 2022-10-03 MED ORDER — ACETAMINOPHEN 500 MG PO TABS
ORAL_TABLET | ORAL | Status: AC
  Filled 2022-10-03: qty 2

## 2022-10-03 MED ORDER — DEXAMETHASONE SODIUM PHOSPHATE 10 MG/ML IJ SOLN
INTRAMUSCULAR | Status: DC | PRN
  Administered 2022-10-03: 8 mg via INTRAVENOUS

## 2022-10-03 MED ORDER — MIDAZOLAM HCL 2 MG/2ML IJ SOLN
INTRAMUSCULAR | Status: AC
  Filled 2022-10-03: qty 2

## 2022-10-03 MED ORDER — MIDAZOLAM HCL 2 MG/2ML IJ SOLN
INTRAMUSCULAR | Status: DC | PRN
  Administered 2022-10-03: 2 mg via INTRAVENOUS

## 2022-10-03 MED ORDER — SUGAMMADEX SODIUM 200 MG/2ML IV SOLN
INTRAVENOUS | Status: DC | PRN
  Administered 2022-10-03: 400 mg via INTRAVENOUS

## 2022-10-03 MED ORDER — CHLORHEXIDINE GLUCONATE 0.12 % MT SOLN: 15.0000 mL | Freq: Once | OROMUCOSAL | Status: AC

## 2022-10-03 MED ORDER — SODIUM CHLORIDE 0.9 % IV SOLN
INTRAVENOUS | Status: AC
  Filled 2022-10-03: qty 20

## 2022-10-03 MED ORDER — METRONIDAZOLE 500 MG/100ML IV SOLN
500.0000 mg | INTRAVENOUS | Status: AC
  Administered 2022-10-03: 500 mg via INTRAVENOUS

## 2022-10-03 MED ORDER — ONDANSETRON HCL 4 MG/2ML IJ SOLN
INTRAMUSCULAR | Status: DC | PRN
  Administered 2022-10-03: 4 mg via INTRAVENOUS

## 2022-10-03 MED ORDER — LIDOCAINE 2% (20 MG/ML) 5 ML SYRINGE
INTRAMUSCULAR | Status: DC | PRN
  Administered 2022-10-03: 60 mg via INTRAVENOUS

## 2022-10-03 SURGICAL SUPPLY — 29 items
BAG COUNTER SPONGE SURGICOUNT (BAG) ×2 IMPLANT
BAG SPNG CNTER NS LX DISP (BAG) ×1
BNDG GAUZE DERMACEA FLUFF 4 (GAUZE/BANDAGES/DRESSINGS) IMPLANT
BNDG GZE DERMACEA 4 6PLY (GAUZE/BANDAGES/DRESSINGS)
COVER MAYO STAND STRL (DRAPES) ×2 IMPLANT
COVER SURGICAL LIGHT HANDLE (MISCELLANEOUS) ×2 IMPLANT
ELECT CAUTERY BLADE 6.4 (BLADE) ×2 IMPLANT
ELECT REM PT RETURN 9FT ADLT (ELECTROSURGICAL) ×1
ELECTRODE REM PT RTRN 9FT ADLT (ELECTROSURGICAL) ×2 IMPLANT
GAUZE PAD ABD 8X10 STRL (GAUZE/BANDAGES/DRESSINGS) IMPLANT
GAUZE SPONGE 4X4 12PLY STRL (GAUZE/BANDAGES/DRESSINGS) IMPLANT
GLOVE BIOGEL PI IND STRL 6 (GLOVE) ×2 IMPLANT
GLOVE BIOGEL PI MICRO STRL 5.5 (GLOVE) ×2 IMPLANT
GOWN STRL REUS W/ TWL LRG LVL3 (GOWN DISPOSABLE) ×4 IMPLANT
GOWN STRL REUS W/TWL LRG LVL3 (GOWN DISPOSABLE) ×2
KIT BASIN OR (CUSTOM PROCEDURE TRAY) ×2 IMPLANT
KIT TURNOVER KIT B (KITS) ×2 IMPLANT
NS IRRIG 1000ML POUR BTL (IV SOLUTION) ×2 IMPLANT
PACK GENERAL/GYN (CUSTOM PROCEDURE TRAY) IMPLANT
PACK LITHOTOMY IV (CUSTOM PROCEDURE TRAY) IMPLANT
PAD ARMBOARD 7.5X6 YLW CONV (MISCELLANEOUS) ×2 IMPLANT
PENCIL SMOKE EVACUATOR (MISCELLANEOUS) ×2 IMPLANT
SPONGE T-LAP 18X18 ~~LOC~~+RFID (SPONGE) IMPLANT
SURGILUBE 2OZ TUBE FLIPTOP (MISCELLANEOUS) ×2 IMPLANT
SYR BULB EAR ULCER 3OZ GRN STR (SYRINGE) ×2 IMPLANT
TOWEL GREEN STERILE (TOWEL DISPOSABLE) ×2 IMPLANT
TOWEL GREEN STERILE FF (TOWEL DISPOSABLE) ×2 IMPLANT
TUBE CONNECTING 12X1/4 (SUCTIONS) ×2 IMPLANT
YANKAUER SUCT BULB TIP NO VENT (SUCTIONS) ×2 IMPLANT

## 2022-10-03 NOTE — Anesthesia Procedure Notes (Signed)
Procedure Name: Intubation Date/Time: 10/03/2022 7:30 AM  Performed by: Inda Coke, CRNAPre-anesthesia Checklist: Patient identified, Emergency Drugs available, Suction available, Timeout performed and Patient being monitored Patient Re-evaluated:Patient Re-evaluated prior to induction Oxygen Delivery Method: Circle system utilized Preoxygenation: Pre-oxygenation with 100% oxygen Induction Type: IV induction Ventilation: Mask ventilation without difficulty and Oral airway inserted - appropriate to patient size Laryngoscope Size: Mac and 4 Grade View: Grade I Tube type: Oral Tube size: 7.5 mm Number of attempts: 1 Airway Equipment and Method: Stylet Placement Confirmation: ETT inserted through vocal cords under direct vision, positive ETCO2, CO2 detector and breath sounds checked- equal and bilateral Secured at: 23 cm Tube secured with: Tape Dental Injury: Teeth and Oropharynx as per pre-operative assessment

## 2022-10-03 NOTE — Anesthesia Preprocedure Evaluation (Signed)
Anesthesia Evaluation  Patient identified by MRN, date of birth, ID band Patient awake    Reviewed: Allergy & Precautions, NPO status , Patient's Chart, lab work & pertinent test results  Airway Mallampati: III  TM Distance: >3 FB Neck ROM: Full    Dental  (+) Dental Advisory Given   Pulmonary former smoker,    breath sounds clear to auscultation       Cardiovascular hypertension, Pt. on medications  Rhythm:Regular Rate:Normal     Neuro/Psych negative neurological ROS     GI/Hepatic negative GI ROS, Neg liver ROS,   Endo/Other  negative endocrine ROS  Renal/GU Renal InsufficiencyRenal disease     Musculoskeletal   Abdominal   Peds  Hematology negative hematology ROS (+)   Anesthesia Other Findings   Reproductive/Obstetrics                             Anesthesia Physical Anesthesia Plan  ASA: 2  Anesthesia Plan: General   Post-op Pain Management: Ofirmev IV (intra-op)*   Induction: Intravenous  PONV Risk Score and Plan: 2 and Dexamethasone, Ondansetron and Treatment may vary due to age or medical condition  Airway Management Planned: LMA and Oral ETT  Additional Equipment:   Intra-op Plan:   Post-operative Plan: Extubation in OR  Informed Consent: I have reviewed the patients History and Physical, chart, labs and discussed the procedure including the risks, benefits and alternatives for the proposed anesthesia with the patient or authorized representative who has indicated his/her understanding and acceptance.     Dental advisory given  Plan Discussed with: CRNA  Anesthesia Plan Comments:         Anesthesia Quick Evaluation

## 2022-10-03 NOTE — Anesthesia Postprocedure Evaluation (Signed)
Anesthesia Post Note  Patient: Ryver Boulais  Procedure(s) Performed: IRRIGATION AND DEBRIDEMENT GLUTEAL ABSCESS (Left: Buttocks)     Patient location during evaluation: PACU Anesthesia Type: General Level of consciousness: awake and alert Pain management: pain level controlled Vital Signs Assessment: post-procedure vital signs reviewed and stable Respiratory status: spontaneous breathing, nonlabored ventilation, respiratory function stable and patient connected to nasal cannula oxygen Cardiovascular status: blood pressure returned to baseline and stable Postop Assessment: no apparent nausea or vomiting Anesthetic complications: no   No notable events documented.  Last Vitals:  Vitals:   10/03/22 0820 10/03/22 0835  BP: 112/68 130/63  Pulse: 83 81  Resp: 18 20  Temp:  (!) 36.3 C  SpO2: 97% 96%    Last Pain:  Vitals:   10/03/22 0835  TempSrc:   PainSc: 0-No pain                 Tiajuana Amass

## 2022-10-03 NOTE — Discharge Instructions (Signed)
[facility] DISCHARGE INSTRUCTIONS  Activity Do not drive the day of surgery or while taking narcotic pain medication.  Wound Care Take sitz baths 2-3 times daily and after bowel movements to help keep your wound clean. Monitor your incision for any new redness. It is normal to have continued drainage from the wound as it is healing.  When to Call us: Fever greater than 100.5 New redness or swelling at incision site Severe pain, nausea, or vomiting  Follow-up We will contact you this week to schedule a follow up appointment. This will be at the [facility] office at [address], [place], [place], [place]. Please arrive at least 15 minutes prior to your scheduled appointment time.  For questions or concerns, please call the office at [phone].

## 2022-10-03 NOTE — Op Note (Signed)
Date: 10/03/22  Patient: Martin Rose MRN: [id number]  Preoperative Diagnosis: Left gluteal abscess, perirectal fistula Postoperative Diagnosis: Same  Procedure: Incision and drainage of left gluteal abscess  Surgeon: Michaelle Birks, MD  EBL: Minimal  Anesthesia: General  Specimens: Left gluteal abscess for culture  Indications: [name] is a 46 yo male with a history of multiple prior perirectal abscesses. About a week ago he began having left gluteal pain, which has progressively worsened. Outpatient pelvic MRI confirmed a large gluteal abscess, with a fistulous tract to the rectum. He was brought to the OR today for incision and drainage.  Findings: Large abscess cavity in the left gluteus tracking toward the left lateral rectum. Majority of the abscess had already spontaneously drained.  Procedure details: Informed consent was obtained in the preoperative area prior to the procedure. The patient was brought to the operating room and general anesthesia was induced. Perioperative antibiotics were administered per SCIP guidelines. The patient was placed in the high lithotomy position, and he was prepped and draped in the usual sterile fashion. A pre-procedure timeout was taken verifying patient identity, surgical site and procedure to be performed.  The prior recent I&D site was visible on the left gluteus, and medial to this there was a small opening where the abscess had started to spontaneously drain. This more medial opening was incised and extended with an 11-blade scalpel. The wound was probed, and there was a large abscess cavity but no significant remaining purulent drainage. The cavity was swabbed for culture. On probing the wound and performing a digital rectal exam, the abscess cavity tracked superiorly toward the left lateral rectum. This was consistent with the MRI findings showing a fistula. The wound was copiously irrigated with sterile saline. A bulky clean gauze dressing  was applied.  The patient tolerated the procedure well with no apparent complications. All counts were correct x2 at the end of the procedure. The patient was extubated and taken to PACU in stable condition.  Michaelle Birks, MD 10/03/22 7:59 AM

## 2022-10-03 NOTE — Transfer of Care (Signed)
Immediate Anesthesia Transfer of Care Note  Patient: Leeam Hamme  Procedure(s) Performed: IRRIGATION AND DEBRIDEMENT GLUTEAL ABSCESS (Left: Buttocks)  Patient Location: PACU  Anesthesia Type:General  Level of Consciousness: awake, alert  and oriented  Airway & Oxygen Therapy: Patient Spontanous Breathing  Post-op Assessment: Report given to RN and Post -op Vital signs reviewed and stable  Post vital signs: Reviewed and stable  Last Vitals:  Vitals Value Taken Time  BP 109/82 10/03/22 0810  Temp    Pulse 87 10/03/22 0812  Resp 13 10/03/22 0812  SpO2 95 % 10/03/22 0812  Vitals shown include unvalidated device data.  Last Pain:  Vitals:   10/03/22 0642  TempSrc: Oral  PainSc: 7       Patients Stated Pain Goal: 3 (31/59/45 8592)  Complications: No notable events documented.

## 2022-10-03 NOTE — H&P (Signed)
Martin Rose is an 46 y.o. male.   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 He established care with Dr. Dema Severin in July of this year and was referred to GI for a colonoscopy (not yet done) as well as an MRI. He was seen in the office last week on 10/17 with left buttock pain and found to have erythema and fluctuance, but I&D did not show any abscess. He was started on oral Augmentin, referred for a pelvic MRI and scheduled for an EUA next month.   Since then, his gluteal pain has worsened. He went to the ED several days ago and labs showed a WBC of 16, however the patient left prior to getting further workup and CT. He ultimately had his pelvic MRI done the evening of 10/20, which showed a large left gluteal abscess as well as a perianal fistula communicating with this collection. He was scheduled for I&D today. He continues to have severe left gluteal pain, but says this morning he started to have a lot of spontaneous drainage from the area. He reports he had a fever at home yesterday but none since. He is still taking Augmentin.   Past Medical History:  Diagnosis Date   History of rectal abscess    Hypertension    Palpitations 07/11/2017   per pt only feels when he over exerts himself without sob   Perirectal fistula    wound/  left anterior    Past Surgical History:  Procedure Laterality Date   ANAL FISTULOTOMY N/A 11/20/2018   Procedure: FISTULOTOMY placement cutting seton;  Surgeon: Ileana Roup, MD;  Location: [facility];  Service: General;  Laterality: N/A;   CARDIAC CATHETERIZATION  07-20-2006   dr Tami Ribas   abnormal cardiolite--  no sig. cad, normal LVF, ef 60%, systemic HTN, elevated LVEDP (22)   FISTULOTOMY N/A 09/11/2018   Procedure: FISTULOTOMY;  Surgeon: Ileana Roup, MD;  Location: [facility];  Service: General;  Laterality: N/A;   FISTULOTOMY N/A 02/14/2020   Procedure: INCISION AND DRAINAGE OF PERIANAL COLLECTION;  Surgeon: Ileana Roup, MD;  Location: [facility];  Service: General;  Laterality: N/A;   INCISION AND DRAINAGE PERIRECTAL ABSCESS Left 03/22/2018   Procedure: IRRIGATION AND DEBRIDEMENT PERIRECTAL ABSCESS;  Surgeon: Judeth Horn, MD;  Location: [facility];  Service: General;  Laterality: Left;   [place] N/A 07/31/2018   Procedure: IRRIGATION AND DEBRIDEMENT PERIRECTAL ABSCESS;  Surgeon: Judeth Horn, MD;  Location: [facility];  Service: General;  Laterality: N/A;   [place] N/A 09/11/2018   Procedure: PLACEMENT OF SETON;  Surgeon: Ileana Roup, MD;  Location: [facility];  Service: General;  Laterality: N/A;   RECTAL EXAM UNDER ANESTHESIA N/A 11/20/2018   Procedure: ANORECTAL EXAM UNDER ANESTHESIA;  Surgeon: Ileana Roup, MD;  Location: [facility];  Service: General;  Laterality: N/A;   RECTAL EXAM UNDER ANESTHESIA N/A 02/14/2020   Procedure: ANORECTAL EXAM UNDER ANESTHESIA;  Surgeon: Ileana Roup, MD;  Location: [facility];  Service: General;  Laterality: N/A;   TRANSTHORACIC ECHOCARDIOGRAM  11/29/2011   moderate LVH, ef 55-60%,  grade 2 diastolic dysfunction/  trivial MR and TR/  mild LAE    Family History  Problem Relation Age of Onset   Hypertension Mother    Hypertension  Father    Cancer Sister 92       brain tumor   Stroke Sister    Social History:  reports that he quit smoking about 13 years ago. His smoking use included cigarettes. He has a 7.50 pack-year smoking history. He has never used smokeless tobacco. He reports current alcohol use of about 24.0 standard drinks of alcohol per week. He reports current drug use. Drug: Marijuana.  Allergies: No Known Allergies  Medications Prior to Admission  Medication Sig Dispense Refill   amLODipine  (NORVASC) 10 MG tablet Take 1 tablet (10 mg total) by mouth daily. 90 tablet 1   atenolol (TENORMIN) 50 MG tablet Take 1 tablet (50 mg total) by mouth daily. 90 tablet 1    No results found for this or any previous visit (from the past 48 hour(s)). MR PELVIS W WO CONTRAST  Result Date: 10/01/2022 CLINICAL DATA:  Anal fistula, perirectal abscess, fistula lanced 3-4 days ago, history of fistula surgery in 2021 and 2020 EXAM: MRI PELVIS WITHOUT AND WITH CONTRAST TECHNIQUE: Multiplanar multisequence MR imaging of the pelvis was performed both before and after administration of intravenous contrast. CONTRAST:  10 mL Vueway gadolinium contrast IV COMPARISON:  CT pelvis, 05/28/2020 FINDINGS: Urinary Tract:  No abnormality visualized. Bowel: An intersphincteric anal fistula arises posteriorly, 6 o'clock face in lithotomy position, with the primary component extending inferiorly to the left aspect of the gluteal cleft, with a large abscess cavity in the subcutaneous soft tissues of the left gluteal cleft measuring at least 9.1 x 7.0 x 4.5 cm with very extensive adjacent soft tissue edema (series 9, image 24, 39, series 8, image 25). An additional component of the fistula extends superiorly into the pelvis and extends to the presacral space, without significant fluid collection internal to the pelvis (series 9, image 18). Fistula tract length inferiorly to the primary component of the fluid collection is approximately 3 cm fistula tract length superiorly into the posterior pelvis is approximately 5 cm. Vascular/Lymphatic: No pathologically enlarged lymph nodes. No significant vascular abnormality seen. Reproductive:  No mass or other significant abnormality Other:  None. Musculoskeletal: No suspicious bone lesions identified. IMPRESSION: 1. Intersphincteric anal fistula arises posteriorly, 6 o'clock face in lithotomy position, with the primary component extending inferiorly to the left aspect of the gluteal cleft. 2.  Large abscess cavity in the subcutaneous soft tissues of the left gluteal cleft measuring at least 9.1 x 7.0 x 4.5 cm with very extensive adjacent soft tissue edema. 3. An additional component of the fistula extends superiorly into the posterior pelvis and extends to the presacral space, without significant fluid collection internal to the pelvis. These results will be called to the ordering clinician or representative by the Radiologist Assistant, and communication documented in the PACS or [employer]. Electronically Signed   By: Jearld Lesch M.D.   On: 10/01/2022 20:24    Review of Systems  Constitutional:  Positive for fever.    There were no vitals taken for this visit. Physical Exam Constitutional:      General: He is not in acute distress. HENT:     Head: Normocephalic and atraumatic.  Pulmonary:     Effort: Pulmonary effort is normal. No respiratory distress.  Genitourinary:    Comments: Swelling, induration and fluctuance of the left gluteus medially, with some spontaneous drainage of purulent fluid. There is still significant underlying fluctuance and tenderness to palpation. Prior I&D site on the left gluteus is granulating. Musculoskeletal:  General: No deformity. Normal range of motion.     Cervical back: Normal range of motion.  Skin:    General: Skin is warm and dry.  Neurological:     General: No focal deficit present.     Mental Status: He is alert and oriented to person, place, and time.  Psychiatric:        Mood and Affect: Mood normal.        Behavior: Behavior normal.        Thought Content: Thought content normal.      Assessment/Plan 47 yo male with a history of perianal fistula and recurrent perirectal abscess, now presenting with a left gluteal abscess. Proceed to the OR for incision and drainage. I reviewed the procedure with the patient and informed consent was obtained. Anticipate discharge home postoperatively. All questions answered.  Fritzi Mandes, MD 10/03/2022, 6:31 AM

## 2022-10-04 ENCOUNTER — Encounter (HOSPITAL_COMMUNITY): Payer: Self-pay | Admitting: Surgery

## 2022-10-08 LAB — AEROBIC/ANAEROBIC CULTURE W GRAM STAIN (SURGICAL/DEEP WOUND)

## 2022-10-18 ENCOUNTER — Ambulatory Visit (HOSPITAL_BASED_OUTPATIENT_CLINIC_OR_DEPARTMENT_OTHER): Payer: 59 | Admitting: Family Medicine

## 2022-10-19 ENCOUNTER — Encounter (HOSPITAL_BASED_OUTPATIENT_CLINIC_OR_DEPARTMENT_OTHER): Payer: Self-pay | Admitting: Family Medicine

## 2022-10-19 ENCOUNTER — Ambulatory Visit (INDEPENDENT_AMBULATORY_CARE_PROVIDER_SITE_OTHER): Payer: 59 | Admitting: Family Medicine

## 2022-10-19 VITALS — BP 131/93 | HR 55 | Temp 97.8°F | Ht 72.0 in | Wt 292.2 lb

## 2022-10-19 DIAGNOSIS — I10 Essential (primary) hypertension: Secondary | ICD-10-CM

## 2022-10-19 DIAGNOSIS — M25531 Pain in right wrist: Secondary | ICD-10-CM

## 2022-10-19 NOTE — Patient Instructions (Signed)
  Medication Instructions:  Your physician recommends that you continue on your current medications as directed. Please refer to the Current Medication list given to you today. --If you need a refill on any your medications before your next appointment, please call your pharmacy first. If no refills are authorized on file call the office.-- Lab Work: Your physician has recommended that you have lab work today: No If you have labs (blood work) drawn today and your tests are completely normal, you will receive your results via MyChart message OR a phone call from our staff.  Please ensure you check your voicemail in the event that you authorized detailed messages to be left on a delegated number. If you have any lab test that is abnormal or we need to change your treatment, we will call you to review the results.  Referrals/Procedures/Imaging: No  Follow-Up: Your next appointment:   Your physician recommends that you schedule a follow-up appointment in: 3 months with Dr. de [place].  You will receive a text message or e-mail with a link to a survey about your care and experience with us today! We would greatly appreciate your feedback!   Thanks for letting us be apart of your health journey!!  Primary Care and Sports Medicine   Dr. Raymond de Cuba   We encourage you to activate your patient portal called "MyChart".  Sign up information is provided on this After Visit Summary.  MyChart is used to connect with patients for Virtual Visits (Telemedicine).  Patients are able to view lab/test results, encounter notes, upcoming appointments, etc.  Non-urgent messages can be sent to your provider as well. To learn more about what you can do with MyChart, please visit --  [email or web address].    

## 2022-10-19 NOTE — Progress Notes (Signed)
    Procedures performed today:    None.  Independent interpretation of notes and tests performed by another provider:   None.  Brief History, Exam, Impression, and Recommendations:    BP (!) 131/93   Pulse (!) 55   Temp 97.8 F (36.6 C) (Oral)   Ht 6' (1.829 m)   Wt 292 lb 3.2 oz (132.5 kg)   SpO2 100%   BMI 39.63 kg/m   Essential hypertension, benign Blood pressure initially elevated in office today, is improved on recheck today.  Patient reports that he checks infrequently at home, has not had readings at goal previously.  Also reports that when he has seen other providers, blood pressure has been better controlled in their offices.  Denies any current concerns related to chest pain or headaches.  He continues with amlodipine and atenolol at this time, denies any issues with these medications. At this time, we can continue with current regimen.  Recommend intermittent monitoring of blood pressure at home, DASH diet Plan for follow-up in about 3 to 4 months to monitor progress  Right wrist pain About 1 week ago, patient had to catch pallets which were falling off the back of his truck.  In doing so he did injure his right wrist and has had some pain over ulnar aspect of wrist.  He denies any prior wrist injuries.  Pain is worse with pronation/supination.  It is also worse when he tries to push with wrist in extended position.  He feels that he did have some swelling in the days after the injury, this has improved.  He has been taping the wrist when he goes to work to try to help with pain. On exam, he does have some tenderness to palpation over ulnar aspect of wrist, just distal to ulna.  No tenderness to palpation through central portion of wrist, no tenderness to palpation over snuffbox.  Does have some slight restriction in wrist extension, normal wrist flexion, normal radial and ulnar deviation.  Distal neurovascular exam is intact.  He does have some reproduced pain with seated  raise. Discussed possibility for injury to TFCC at ulnar aspect of wrist.  Discussed general measures at this time including ice, splinting, OTC medications to help with pain control.  Discussed option of proceeding with x-rays at this time, patient would like to monitor over the next few weeks and let us know if he would like to proceed with x-rays if symptoms persist.  Discussed that symptoms may generally improve with use of conservative measures.  It is also possible that symptoms persist in which case, advanced imaging may be required for further evaluation Patient will monitor at this time and let us know if symptoms persist and we will proceed with x-rays to be done at [facility] imaging if that is the case  Return in about 3 months (around 01/19/2023) for HTN.   ___________________________________________ Tonja Jezewski de [place], MD, ABFM, CAQSM Primary Care and [place]

## 2022-10-19 NOTE — Assessment & Plan Note (Signed)
Blood pressure initially elevated in office today, is improved on recheck today.  Patient reports that he checks infrequently at home, has not had readings at goal previously.  Also reports that when he has seen other providers, blood pressure has been better controlled in their offices.  Denies any current concerns related to chest pain or headaches.  He continues with amlodipine and atenolol at this time, denies any issues with these medications. At this time, we can continue with current regimen.  Recommend intermittent monitoring of blood pressure at home, DASH diet Plan for follow-up in about 3 to 4 months to monitor progress

## 2022-10-19 NOTE — Assessment & Plan Note (Signed)
About 1 week ago, patient had to catch pallets which were falling off the back of his truck.  In doing so he did injure his right wrist and has had some pain over ulnar aspect of wrist.  He denies any prior wrist injuries.  Pain is worse with pronation/supination.  It is also worse when he tries to push with wrist in extended position.  He feels that he did have some swelling in the days after the injury, this has improved.  He has been taping the wrist when he goes to work to try to help with pain. On exam, he does have some tenderness to palpation over ulnar aspect of wrist, just distal to ulna.  No tenderness to palpation through central portion of wrist, no tenderness to palpation over snuffbox.  Does have some slight restriction in wrist extension, normal wrist flexion, normal radial and ulnar deviation.  Distal neurovascular exam is intact.  He does have some reproduced pain with seated raise. Discussed possibility for injury to TFCC at ulnar aspect of wrist.  Discussed general measures at this time including ice, splinting, OTC medications to help with pain control.  Discussed option of proceeding with x-rays at this time, patient would like to monitor over the next few weeks and let us know if he would like to proceed with x-rays if symptoms persist.  Discussed that symptoms may generally improve with use of conservative measures.  It is also possible that symptoms persist in which case, advanced imaging may be required for further evaluation Patient will monitor at this time and let us know if symptoms persist and we will proceed with x-rays to be done at [facility] imaging if that is the case

## 2022-10-26 NOTE — Progress Notes (Signed)
Sent message, via epic in basket, requesting orders in epic from surgeon.  

## 2022-10-27 ENCOUNTER — Ambulatory Visit: Payer: Self-pay | Admitting: Surgery

## 2022-10-27 NOTE — Progress Notes (Signed)
COVID Vaccine received:  _0  No _1  Yes Date of any COVID positive Test in last 90 days:  None  PCP - Raymond de [place], MD Cardiologist - none  Chest x-ray -  EKG -  10-03-2022 epic Stress Test -  ECHO - 11-29-2011 Epic Cardiac Cath - 07-20-2006  ? Dr. Tami Ribas  PCR screen: _2  Ordered & Completed                      _3   No Order but Needs PROFEND                      _4   N/A for this surgery  Surgery Plan:  _5  Ambulatory                            _6  Outpatient in bed                            _7  Admit  Anesthesia:    _8  General  _9  Spinal                           _10   Choice _11   MAC  Bowel Prep - _12  No  _13   Yes _2 fleet Enemas__  Pacemaker / ICD device _14  No _15  Yes        Device order form faxed _16  No    _17   Yes      Faxed to:  Spinal Cord Stimulator:_18  No _19  Yes      (Remind patient to bring remote DOS) Other Implants:   History of Sleep Apnea? _20  No _21  Yes   CPAP used?- _22  No _23  Yes    Does the patient monitor blood sugar? _24  No _25  Yes  _26  N/A Does patient have a [employer] or Dexacom? _27  No _28  Yes   Fasting Blood Sugar Ranges-  Checks Blood Sugar __2_ times a month  Blood Thinner / Instructions: none Aspirin Instructions:  ERAS Protocol Ordered: _29  No  _30  Yes   Patient is to be NPO after: Midnight prior  Comments: Patient is going to call Dr's office re: use of Fleets enema. 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 He was given an oral laxative to take prior to his surgery. He voiced understanding about the need for clarification of his bowel prep.  Activity level: Patient can climb a flight of stairs without difficulty; _31  No CP  _32  No SOB  Patient can perform ADLs without assistance.   Anesthesia review: HTN, Pre-DM (diet, no meds) Palps only when not taking his meds.   Patient denies shortness of breath, fever, cough and chest pain at PAT appointment.  Patient verbalized understanding and agreement to  the Pre-Surgical Instructions that were given to them at this PAT appointment. Patient was also educated of the need to review these PAT instructions again prior to his/her surgery.I reviewed the appropriate phone numbers to call if they have any and questions or concerns.

## 2022-10-27 NOTE — Patient Instructions (Signed)
SURGICAL WAITING ROOM VISITATION Patients having surgery or a procedure may have no more than 2 support people in the waiting area - these visitors may rotate in the visitor waiting room.   Children under the age of 87 must have an adult with them who is not the patient. If the patient needs to stay at the hospital during part of their recovery, the visitor guidelines for inpatient rooms apply.  PRE-OP VISITATION  Pre-op nurse will coordinate an appropriate time for 1 support person to accompany the patient in pre-op.  This support person may not rotate.  This visitor will be contacted when the time is appropriate for the visitor to come back in the pre-op area.  Please refer to the [facility] website for the visitor guidelines for Inpatients (after your surgery is over and you are in a regular room).  You are not required to quarantine at this time prior to your surgery. However, you must do this: Hand Hygiene often Do NOT share personal items Notify your provider if you are in close contact with someone who has COVID or you develop fever 100.4 or greater, new onset of sneezing, cough, sore throat, shortness of breath or body aches.  If you test positive for Covid or have been in contact with anyone that has tested positive in the last 10 days please notify you surgeon.    Your procedure is scheduled on:  November 01, 2022  Report to [facility] Main Entrance: [place] entrance where the [employer] is available.   Report to admitting at: 08:15    AM  +++++Call this number if you have any questions or problems the morning of surgery [phone]  DO NOT EAT OR DRINK ANYTHING AFTER MIDNIGHT THE NIGHT PRIOR TO YOUR SURGERY / PROCEDURE.   FOLLOW BOWEL PREP AND ANY ADDITIONAL PRE OP INSTRUCTIONS YOU RECEIVED FROM YOUR SURGEON'S OFFICE!!! 1 Fleets Enema the night before your surgery and 1 fleets enema the morning of your surgery.   Oral Hygiene is also important to reduce your  risk of infection.        Remember - BRUSH YOUR TEETH THE MORNING OF SURGERY WITH YOUR REGULAR TOOTHPASTE  Do NOT smoke after Midnight the night before surgery.  Take ONLY these medicines the morning of surgery with A SIP OF WATER: Atenolol, Amlodipine                  You may not have any metal on your body including  jewelry, and body piercing  Do not wear  lotions, powders, cologne, or deodorant  Men may shave face and neck.  Patients discharged on the day of surgery will not be allowed to drive home.  Someone NEEDS to stay with you for the first 24 hours after anesthesia.  Do not bring your home medications to the hospital. The Pharmacy will dispense medications listed on your medication list to you during your admission in the Hospital.  Please read over the following fact sheets you were given: IF YOU HAVE QUESTIONS ABOUT YOUR PRE-OP INSTRUCTIONS, PLEASE CALL [phone]  (KAY)   [place] - Preparing for Surgery Before surgery, you can play an important role.  Because skin is not sterile, your skin needs to be as free of germs as possible.  You can reduce the number of germs on your skin by washing with CHG (chlorahexidine gluconate) soap before surgery.  CHG is an antiseptic cleaner which kills germs and bonds with the skin to continue  killing germs even after washing. Please DO NOT use if you have an allergy to CHG or antibacterial soaps.  If your skin becomes reddened/irritated stop using the CHG and inform your nurse when you arrive at Short Stay. Do not shave (including legs and underarms) for at least 48 hours prior to the first CHG shower.  You may shave your face/neck.  Please follow these instructions carefully:  1.  Shower with CHG Soap the night before surgery and the  morning of surgery.  2.  If you choose to wash your hair, wash your hair first as usual with your normal  shampoo.  3.  After you shampoo, rinse your hair and body thoroughly to remove the shampoo.                              4.  Use CHG as you would any other liquid soap.  You can apply chg directly to the skin and wash.  Gently with a scrungie or clean washcloth.  5.  Apply the CHG Soap to your body ONLY FROM THE NECK DOWN.   Do not use on face/ open                           Wound or open sores. Avoid contact with eyes, ears mouth and genitals (private parts).                       Wash face,  Genitals (private parts) with your normal soap.             6.  Wash thoroughly, paying special attention to the area where your  surgery  will be performed.  7.  Thoroughly rinse your body with warm water from the neck down.  8.  DO NOT shower/wash with your normal soap after using and rinsing off the CHG Soap.            9.  Pat yourself dry with a clean towel.            10.  Wear clean pajamas.            11.  Place clean sheets on your bed the night of your first shower and do not  sleep with pets.  ON THE DAY OF SURGERY : Do not apply any lotions/deodorants the morning of surgery.  Please wear clean clothes to the hospital/surgery center.    FAILURE TO FOLLOW THESE INSTRUCTIONS MAY RESULT IN THE CANCELLATION OF YOUR SURGERY  PATIENT SIGNATURE_________________________________  NURSE SIGNATURE__________________________________  ________________________________________________________________________

## 2022-10-28 ENCOUNTER — Other Ambulatory Visit: Payer: Self-pay

## 2022-10-28 ENCOUNTER — Encounter (HOSPITAL_COMMUNITY)
Admission: RE | Admit: 2022-10-28 | Discharge: 2022-10-28 | Disposition: A | Discharge status: Home / Self Care | Payer: 59 | Source: Ambulatory Visit | Attending: Surgery | Admitting: Surgery

## 2022-10-28 ENCOUNTER — Encounter (HOSPITAL_COMMUNITY): Payer: Self-pay

## 2022-10-28 VITALS — BP 126/82 | HR 60 | Temp 98.1°F | Resp 20 | Ht 72.0 in | Wt 292.0 lb

## 2022-10-28 DIAGNOSIS — I10 Essential (primary) hypertension: Secondary | ICD-10-CM

## 2022-10-28 DIAGNOSIS — R7303 Prediabetes: Secondary | ICD-10-CM | POA: Insufficient documentation

## 2022-10-28 DIAGNOSIS — Z01812 Encounter for preprocedural laboratory examination: Secondary | ICD-10-CM | POA: Insufficient documentation

## 2022-10-28 HISTORY — DX: Prediabetes: R73.03

## 2022-10-28 LAB — BASIC METABOLIC PANEL
Anion gap: 5 (ref 5–15)
BUN: 13 mg/dL (ref 6–20)
CO2: 27 mmol/L (ref 22–32)
Calcium: 9.2 mg/dL (ref 8.9–10.3)
Chloride: 109 mmol/L (ref 98–111)
Creatinine, Ser: 1.13 mg/dL (ref 0.61–1.24)
GFR, Estimated: >60 mL/min (ref >60)
Glucose, Bld: 98 mg/dL (ref 70–99)
Potassium: 4.6 mmol/L (ref 3.5–5.1)
Sodium: 141 mmol/L (ref 135–145)

## 2022-10-28 LAB — CBC
HCT: 39.8 % (ref 39.0–52.0)
Hemoglobin: 12.7 g/dL — ABNORMAL LOW (ref 13.0–17.0)
MCH: 26.6 pg (ref 26.0–34.0)
MCHC: 31.9 g/dL (ref 30.0–36.0)
MCV: 83.3 fL (ref 80.0–100.0)
Platelets: 241 10*3/uL (ref 150–400)
RBC: 4.78 MIL/uL (ref 4.22–5.81)
RDW: 15.5 % (ref 11.5–15.5)
WBC: 8.9 10*3/uL (ref 4.0–10.5)
nRBC: 0 % (ref 0.0–0.2)

## 2022-10-28 LAB — HEMOGLOBIN A1C
Hgb A1c MFr Bld: 5.9 % — ABNORMAL HIGH (ref 4.8–5.6)
Mean Plasma Glucose: 122.63 mg/dL

## 2022-10-28 LAB — GLUCOSE, CAPILLARY: Glucose-Capillary: 109 mg/dL — ABNORMAL HIGH (ref 70–99)

## 2022-11-01 ENCOUNTER — Encounter (HOSPITAL_COMMUNITY)
Admission: RE | Disposition: A | Discharge status: Home / Self Care | Payer: Self-pay | Source: Home / Self Care | Attending: Surgery

## 2022-11-01 ENCOUNTER — Ambulatory Visit (HOSPITAL_COMMUNITY)
Admission: RE | Admit: 2022-11-01 | Discharge: 2022-11-01 | Disposition: A | Discharge status: Home / Self Care | Payer: 59 | Attending: Surgery | Admitting: Surgery

## 2022-11-01 ENCOUNTER — Encounter (HOSPITAL_COMMUNITY): Payer: Self-pay | Admitting: Surgery

## 2022-11-01 ENCOUNTER — Other Ambulatory Visit: Payer: Self-pay

## 2022-11-01 ENCOUNTER — Ambulatory Visit (HOSPITAL_BASED_OUTPATIENT_CLINIC_OR_DEPARTMENT_OTHER): Payer: 59 | Admitting: Anesthesiology

## 2022-11-01 ENCOUNTER — Ambulatory Visit (HOSPITAL_COMMUNITY): Payer: 59 | Admitting: Anesthesiology

## 2022-11-01 DIAGNOSIS — Z79899 Other long term (current) drug therapy: Secondary | ICD-10-CM | POA: Diagnosis not present

## 2022-11-01 DIAGNOSIS — Z6839 Body mass index (BMI) 39.0-39.9, adult: Secondary | ICD-10-CM | POA: Insufficient documentation

## 2022-11-01 DIAGNOSIS — Z87891 Personal history of nicotine dependence: Secondary | ICD-10-CM | POA: Diagnosis not present

## 2022-11-01 DIAGNOSIS — K603 Anal fistula: Secondary | ICD-10-CM | POA: Insufficient documentation

## 2022-11-01 DIAGNOSIS — K611 Rectal abscess: Secondary | ICD-10-CM | POA: Diagnosis not present

## 2022-11-01 DIAGNOSIS — I10 Essential (primary) hypertension: Secondary | ICD-10-CM

## 2022-11-01 DIAGNOSIS — R7303 Prediabetes: Secondary | ICD-10-CM | POA: Diagnosis not present

## 2022-11-01 HISTORY — PX: RECTAL EXAM UNDER ANESTHESIA: SHX6399

## 2022-11-01 HISTORY — PX: PLACEMENT OF SETON: SHX6029

## 2022-11-01 LAB — GLUCOSE, CAPILLARY: Glucose-Capillary: 121 mg/dL — ABNORMAL HIGH (ref 70–99)

## 2022-11-01 SURGERY — EXAM UNDER ANESTHESIA, RECTUM
Anesthesia: General

## 2022-11-01 MED ORDER — MIDAZOLAM HCL 2 MG/2ML IJ SOLN
INTRAMUSCULAR | Status: AC
  Filled 2022-11-01: qty 2

## 2022-11-01 MED ORDER — SUGAMMADEX SODIUM 200 MG/2ML IV SOLN
INTRAVENOUS | Status: DC | PRN
  Administered 2022-11-01: 270 mg via INTRAVENOUS

## 2022-11-01 MED ORDER — DEXAMETHASONE SODIUM PHOSPHATE 10 MG/ML IJ SOLN
INTRAMUSCULAR | Status: DC | PRN
  Administered 2022-11-01: 8 mg via INTRAVENOUS

## 2022-11-01 MED ORDER — ONDANSETRON HCL 4 MG/2ML IJ SOLN
INTRAMUSCULAR | Status: DC | PRN
  Administered 2022-11-01: 4 mg via INTRAVENOUS

## 2022-11-01 MED ORDER — BUPIVACAINE-EPINEPHRINE (PF) 0.25% -1:200000 IJ SOLN
INTRAMUSCULAR | Status: DC | PRN
  Administered 2022-11-01: 30 mL

## 2022-11-01 MED ORDER — TRAMADOL HCL 50 MG PO TABS: 50.0000 mg | ORAL_TABLET | Freq: Four times a day (QID) | ORAL | 0 refills | Status: AC | PRN

## 2022-11-01 MED ORDER — MIDAZOLAM HCL 2 MG/2ML IJ SOLN
INTRAMUSCULAR | Status: DC | PRN
  Administered 2022-11-01: 2 mg via INTRAVENOUS

## 2022-11-01 MED ORDER — FENTANYL CITRATE (PF) 100 MCG/2ML IJ SOLN
INTRAMUSCULAR | Status: AC
  Filled 2022-11-01: qty 2

## 2022-11-01 MED ORDER — FLEET ENEMA 7-19 GM/118ML RE ENEM: 1.0000 | ENEMA | Freq: Once | RECTAL | Status: DC

## 2022-11-01 MED ORDER — ACETAMINOPHEN 500 MG PO TABS: 1000.0000 mg | ORAL_TABLET | Freq: Once | ORAL | Status: DC

## 2022-11-01 MED ORDER — BUPIVACAINE LIPOSOME 1.3 % IJ SUSP
INTRAMUSCULAR | Status: DC | PRN
  Administered 2022-11-01: 20 mL

## 2022-11-01 MED ORDER — LIDOCAINE 2% (20 MG/ML) 5 ML SYRINGE
INTRAMUSCULAR | Status: DC | PRN
  Administered 2022-11-01: 60 mg via INTRAVENOUS
  Administered 2022-11-01: 40 mg via INTRAVENOUS

## 2022-11-01 MED ORDER — FLEET ENEMA 7-19 GM/118ML RE ENEM
1.0000 | ENEMA | Freq: Once | RECTAL | Status: DC
  Filled 2022-11-01: qty 1

## 2022-11-01 MED ORDER — BUPIVACAINE-EPINEPHRINE (PF) 0.5% -1:200000 IJ SOLN
INTRAMUSCULAR | Status: AC
  Filled 2022-11-01: qty 30

## 2022-11-01 MED ORDER — PROPOFOL 10 MG/ML IV BOLUS
INTRAVENOUS | Status: DC | PRN
  Administered 2022-11-01: 280 mg via INTRAVENOUS

## 2022-11-01 MED ORDER — LACTATED RINGERS IV SOLN: INTRAVENOUS | Status: DC

## 2022-11-01 MED ORDER — PROPOFOL 10 MG/ML IV BOLUS
INTRAVENOUS | Status: AC
  Filled 2022-11-01: qty 20

## 2022-11-01 MED ORDER — DEXAMETHASONE SODIUM PHOSPHATE 10 MG/ML IJ SOLN
INTRAMUSCULAR | Status: AC
  Filled 2022-11-01: qty 1

## 2022-11-01 MED ORDER — CHLORHEXIDINE GLUCONATE 0.12 % MT SOLN
15.0000 mL | Freq: Once | OROMUCOSAL | Status: AC
  Administered 2022-11-01: 15 mL via OROMUCOSAL

## 2022-11-01 MED ORDER — FENTANYL CITRATE (PF) 100 MCG/2ML IJ SOLN
INTRAMUSCULAR | Status: DC | PRN
  Administered 2022-11-01: 100 ug via INTRAVENOUS

## 2022-11-01 MED ORDER — PROMETHAZINE HCL 25 MG/ML IJ SOLN: 6.2500 mg | INTRAMUSCULAR | Status: DC | PRN

## 2022-11-01 MED ORDER — ACETAMINOPHEN 500 MG PO TABS
1000.0000 mg | ORAL_TABLET | ORAL | Status: AC
  Administered 2022-11-01: 1000 mg via ORAL
  Filled 2022-11-01: qty 2

## 2022-11-01 MED ORDER — FENTANYL CITRATE PF 50 MCG/ML IJ SOSY: 25.0000 ug | PREFILLED_SYRINGE | INTRAMUSCULAR | Status: DC | PRN

## 2022-11-01 MED ORDER — ORAL CARE MOUTH RINSE: 15.0000 mL | Freq: Once | OROMUCOSAL | Status: AC

## 2022-11-01 MED ORDER — OXYCODONE HCL 5 MG/5ML PO SOLN: 5.0000 mg | Freq: Once | ORAL | Status: DC | PRN

## 2022-11-01 MED ORDER — ROCURONIUM BROMIDE 10 MG/ML (PF) SYRINGE
PREFILLED_SYRINGE | INTRAVENOUS | Status: AC
  Filled 2022-11-01: qty 10

## 2022-11-01 MED ORDER — PHENYLEPHRINE 80 MCG/ML (10ML) SYRINGE FOR IV PUSH (FOR BLOOD PRESSURE SUPPORT)
PREFILLED_SYRINGE | INTRAVENOUS | Status: DC | PRN
  Administered 2022-11-01: 160 ug via INTRAVENOUS

## 2022-11-01 MED ORDER — CELECOXIB 200 MG PO CAPS
200.0000 mg | ORAL_CAPSULE | Freq: Once | ORAL | Status: AC
  Administered 2022-11-01: 200 mg via ORAL
  Filled 2022-11-01: qty 1

## 2022-11-01 MED ORDER — ONDANSETRON HCL 4 MG/2ML IJ SOLN
INTRAMUSCULAR | Status: AC
  Filled 2022-11-01: qty 2

## 2022-11-01 MED ORDER — ROCURONIUM BROMIDE 10 MG/ML (PF) SYRINGE
PREFILLED_SYRINGE | INTRAVENOUS | Status: DC | PRN
  Administered 2022-11-01: 20 mg via INTRAVENOUS
  Administered 2022-11-01: 50 mg via INTRAVENOUS

## 2022-11-01 MED ORDER — OXYCODONE HCL 5 MG PO TABS: 5.0000 mg | ORAL_TABLET | Freq: Once | ORAL | Status: DC | PRN

## 2022-11-01 MED ORDER — LIDOCAINE HCL (PF) 2 % IJ SOLN
INTRAMUSCULAR | Status: AC
  Filled 2022-11-01: qty 5

## 2022-11-01 MED ORDER — BUPIVACAINE LIPOSOME 1.3 % IJ SUSP: 20.0000 mL | Freq: Once | INTRAMUSCULAR | Status: DC

## 2022-11-01 MED ORDER — 0.9 % SODIUM CHLORIDE (POUR BTL) OPTIME
TOPICAL | Status: DC | PRN
  Administered 2022-11-01: 1000 mL

## 2022-11-01 MED ORDER — BUPIVACAINE LIPOSOME 1.3 % IJ SUSP
INTRAMUSCULAR | Status: AC
  Filled 2022-11-01: qty 20

## 2022-11-01 SURGICAL SUPPLY — 29 items
BAG COUNTER SPONGE SURGICOUNT (BAG) IMPLANT
BAG SPNG CNTER NS LX DISP (BAG)
BRIEF MESH DISP LRG (UNDERPADS AND DIAPERS) ×2 IMPLANT
ELECT NDL BLADE 2-5/6 (NEEDLE) ×2 IMPLANT
ELECT NEEDLE BLADE 2-5/6 (NEEDLE) ×1 IMPLANT
ELECT REM PT RETURN 15FT ADLT (MISCELLANEOUS) ×2 IMPLANT
GAUZE PAD ABD 7.5X8 STRL (GAUZE/BANDAGES/DRESSINGS) IMPLANT
GAUZE PAD ABD 8X10 STRL (GAUZE/BANDAGES/DRESSINGS) IMPLANT
GAUZE SPONGE 4X4 12PLY STRL (GAUZE/BANDAGES/DRESSINGS) IMPLANT
GLOVE BIO SURGEON STRL SZ7.5 (GLOVE) ×2 IMPLANT
GLOVE INDICATOR 8.0 STRL GRN (GLOVE) ×2 IMPLANT
GOWN STRL REUS W/ TWL XL LVL3 (GOWN DISPOSABLE) ×4 IMPLANT
GOWN STRL REUS W/TWL XL LVL3 (GOWN DISPOSABLE) ×2
KIT BASIN OR (CUSTOM PROCEDURE TRAY) ×2 IMPLANT
KIT TURNOVER KIT A (KITS) IMPLANT
NEEDLE HYPO 22GX1.5 SAFETY (NEEDLE) ×2 IMPLANT
PACK GENERAL/GYN (CUSTOM PROCEDURE TRAY) ×2 IMPLANT
SHEARS HARMONIC 9CM CVD (BLADE) IMPLANT
SPIKE FLUID TRANSFER (MISCELLANEOUS) ×2 IMPLANT
SPONGE SURGIFOAM ABS GEL 100 (HEMOSTASIS) IMPLANT
SURGILUBE 2OZ TUBE FLIPTOP (MISCELLANEOUS) ×2 IMPLANT
SUT CHROMIC 2 0 SH (SUTURE) IMPLANT
SUT CHROMIC 3 0 SH 27 (SUTURE) IMPLANT
SUT MNCRL AB 4-0 PS2 18 (SUTURE) ×2 IMPLANT
SUT VIC AB 3-0 SH 27 (SUTURE) ×1
SUT VIC AB 3-0 SH 27X BRD (SUTURE) ×2 IMPLANT
SYR 20ML LL LF (SYRINGE) ×2 IMPLANT
TOWEL OR 17X26 10 PK STRL BLUE (TOWEL DISPOSABLE) ×2 IMPLANT
TOWEL OR NON WOVEN STRL DISP B (DISPOSABLE) ×2 IMPLANT

## 2022-11-01 NOTE — Anesthesia Preprocedure Evaluation (Addendum)
Anesthesia Evaluation  Patient identified by MRN, date of birth, ID band Patient awake    Reviewed: Allergy & Precautions, NPO status , Patient's Chart, lab work & pertinent test results, reviewed documented beta blocker date and time   History of Anesthesia Complications Negative for: history of anesthetic complications  Airway Mallampati: II  TM Distance: >3 FB Neck ROM: Full    Dental  (+) Dental Advisory Given   Pulmonary former smoker   Pulmonary exam normal        Cardiovascular hypertension, Pt. on medications and Pt. on home beta blockers Normal cardiovascular exam     Neuro/Psych negative neurological ROS  negative psych ROS   GI/Hepatic negative GI ROS,,,(+)     substance abuse  alcohol use  Endo/Other    Morbid obesity Pre-DM   Renal/GU negative Renal ROS     Musculoskeletal  Scoliosis     Abdominal   Peds  Hematology negative hematology ROS (+)   Anesthesia Other Findings   Reproductive/Obstetrics                             Anesthesia Physical Anesthesia Plan  ASA: 3  Anesthesia Plan: General   Post-op Pain Management: Tylenol PO (pre-op)* and Celebrex PO (pre-op)*   Induction: Intravenous  PONV Risk Score and Plan: 2 and Treatment may vary due to age or medical condition, Ondansetron, Dexamethasone and Midazolam  Airway Management Planned: Oral ETT  Additional Equipment: None  Intra-op Plan:   Post-operative Plan: Extubation in OR  Informed Consent: I have reviewed the patients History and Physical, chart, labs and discussed the procedure including the risks, benefits and alternatives for the proposed anesthesia with the patient or authorized representative who has indicated his/her understanding and acceptance.     Dental advisory given  Plan Discussed with: CRNA and Anesthesiologist  Anesthesia Plan Comments:        Anesthesia Quick  Evaluation

## 2022-11-01 NOTE — Transfer of Care (Signed)
Immediate Anesthesia Transfer of Care Note  Patient: Sampson Waltz  Procedure(s) Performed: ANORECTAL EXAM UNDER ANESTHESIA, INTERROGATION OF PERIRECTAL WOUND PARTIAL FISTULOTOMY AND PLACEMENT OF DRAINING SETON  Patient Location: PACU  Anesthesia Type:General  Level of Consciousness: awake, alert , and oriented  Airway & Oxygen Therapy: Patient Spontanous Breathing and Patient connected to face mask oxygen  Post-op Assessment: Report given to RN, Post -op Vital signs reviewed and stable, and Patient moving all extremities X 4  Post vital signs: Reviewed and stable  Last Vitals:  Vitals Value Taken Time  BP 163/96   Temp    Pulse 69 11/01/22 1141  Resp 13 11/01/22 1141  SpO2 100 % 11/01/22 1141  Vitals shown include unvalidated device data.  Last Pain:  Vitals:   11/01/22 0847  TempSrc:   PainSc: 3       Patients Stated Pain Goal: 4 (11/01/22 0847)  Complications: No notable events documented.

## 2022-11-01 NOTE — Op Note (Signed)
11/01/2022  11:33 AM  PATIENT:  Martin Rose  46 y.o. 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  EBL: 15 mL  Drains: Blue vessel loop draining seton  PLAN OF CARE: Discharge to home after PACU  PATIENT DISPOSITION:  PACU - hemodynamically stable.  OR FINDINGS: Left perirectal/ischial rectal scar tissue with palpable cord emanating towards the anal canal.  Along at least 10 cm tract is identified traveling somewhat superficially through the perianal skin and coursing towards the anterior side of the anal canal.  We initially attempted to make a counterincision laterally but found this was too far from the actual fistula tract and therefore made a second counterincision more anteriorly.  We were not definitively able to find the internal opening of his anal fistula due to his habitus, length of the tract, and out of concern for creating false tracts.  Therefore, a counterincision was used for placement of a draining seton to reduce the risk of recurrent perianal/perirectal abscess.  Subsequent procedure should he desire to proceed with this will likely involve repeat exam under anesthesia with attempts to delineate the internal opening by accessing a counterincision close to the anal canal.  He has very deep buttocks which make attempts at controlling his fistula increasingly more challenging.  The prone jackknife position provides the best exposure attempts.  DESCRIPTION: The  patient was identified in the preoperative holding area and taken to the OR. SCDs were applied. He then underwent general endotracheal anesthesia without difficulty. The patient was then rolled onto the OR table in the prone jackknife position. Pressure points were then evaluated and padded. Benzoin was applied to the buttocks and they were gently taped apart.  He was then prepped and draped in usual sterile fashion.  A surgical timeout was performed indicating the correct patient, procedure, and positioning.  A perianal block was then created using a dilute mixture of 0.25% Marcaine with epinephrine and Exparel.  After ascertaining an appropriate level of anesthesia had been achieved, a well lubricated digital rectal exam was performed. This demonstrated no palpable masses but some scar tissue anteriorly.  A Hill-Ferguson anoscope was into the anal canal and circumferential inspection demonstrated scarring within the anal canal consistent with his history but no significant granulation tissue, active fissures, or significant hemorrhoids.    On the left gluteal skin there is firmness/hardness consistent with his history with a palpable cord emanating towards the anal canal.  We began by incising this.  A flexible fistula probe was then cautiously passed so as to avoid creating false passages and a tract is read identified emanating towards the anal canal.  We began by making a counterincision laterally in an attempt to shorten the tract and allow better attempts at controlling the fistulas were not able to definitively find internal opening.  This tract appear to be to lateral and therefore was closed using 3-0 chromic suture.  More anteriorly, were able to make an incision radially in the perianal skin.  Were able to cut down to the level  of our fistula probe.  The intervening fistula is approximately 10 cm in length.  This is controlled with a blue vessel loop seton that is subsequently secured to itself using  multiple 2-0 silk ligatures.  Then using this counterincision, we attempted to pass the fistula probe into the anal canal to find the internal opening but were not able to clearly delineate the tract despite multiple careful attempts.  Further attempts were deemed to increase the risk for false passages and therefore no further attempts were made.  All wounds are additionally infiltrated with our mixture of Marcaine and Exparel.  Hemostasis is verified.  The buttocks are untaped.  A dressing consisting of 4 x 4's, ABD, and mesh underwear is placed.  DISPOSITION: PACU in satisfactory condition.

## 2022-11-01 NOTE — H&P (Signed)
CC: Here today for surgery  HPI: Martin Rose is an 46 y.o. male with known hx of anal fistula. He has previously undergone multiple incision and drainages by Dr. Hulen Skains. Subsequently, 09/11/2018, we took him for an EUA and placement of draining seton. 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  11/20/2018 underwent partial fistulotomy left anterior with placement of cutting seton. Subcutaneous fistulotomy of a posterior midline anal fistula presumably related to prior anal fissure.  He then developed a recurrent fluid collection 2 years later 02/14/2020 and underwent I&D of a 5 - 10 mm perianal fluid collection. Found to have scar in the left anterior perianal region. He was found to have a wound that did not communicate but was a blind-ending tract.  He has had recurrent I&D's since including in the emergency room 06/2021. Subsequently, another repeat I&D 01/19/2022.  He is in the process of establishing care with [facility] for colonoscopy. He does report he has had colonoscopies in the past done out in [facility]. He states that his current GI wanted to see the copies of his prior scopes before he was seen.   We referred him to [place] but he has not had an appoint with them yet. Was in the process of obtaining records from [facility].  He had increased pain and swelling after his last visit with [place] concerning for an undrained abscess. We recommended proceeding to the hospital. He had waited for numerous hours in the emergency room and was not getting where he needed to get and ended up leaving. He ultimately had his MRI completed 10/01/2022 which showed an intersphincteric fistula posteriorly near the midline with a large abscess cavity in the subcutaneous soft tissues of the left buttock measuring 9 x 7 x 5 cm. He was direct admitted to the hospital for a procedure. Between his MRI and the procedure the next morning he had spontaneous drainage of an abscess. He was taken to the OR by  one of my partners, Dr. Zenia Resides, and underwent drainage of the left gluteal abscess 10/03/2022.  He was discharged home and returned for follow-up. He had been doing much better. Pain resolved. Scant serous drainage at this point from the left buttock. No fever/chills. No buttock pain or difficulty urinating. He has returned to work.  He denies any changes in his health or health history since we met in the office.  No new perirectal abscesses.  Here today with his wife.  States he is ready for surgery.  PMH: Anal abscess/fistula  PSH: As above  FHx: Denies any known family history of colorectal, breast, endometrial or ovarian cancer  Social Hx: Denies use of tobacco/EtOH/illicit drug.   Past Medical History:  Diagnosis Date   History of rectal abscess    Hypertension    Palpitations 07/11/2017   per pt only feels when he over exerts himself without sob   Perirectal fistula    wound/  left anterior   Pre-diabetes     Past Surgical History:  Procedure Laterality Date   ANAL FISTULOTOMY N/A 11/20/2018   Procedure: FISTULOTOMY placement cutting seton;  Surgeon: Ileana Roup, MD;  Location: [facility];  Service: General;  Laterality: N/A;   CARDIAC CATHETERIZATION  07-20-2006   dr Tami Ribas   abnormal cardiolite--  no sig. cad, normal LVF, ef 60%, systemic HTN, elevated LVEDP (22)   FISTULOTOMY N/A 09/11/2018   Procedure: FISTULOTOMY;  Surgeon: Ileana Roup, MD;  Location: [place];  Service: General;  Laterality: N/A;   FISTULOTOMY N/A 02/14/2020   Procedure: INCISION AND DRAINAGE OF PERIANAL COLLECTION;  Surgeon: Ileana Roup, MD;  Location: [place];  Service: General;  Laterality: N/A;   INCISION AND DRAINAGE PERIRECTAL ABSCESS Left 03/22/2018   Procedure: IRRIGATION AND DEBRIDEMENT PERIRECTAL ABSCESS;  Surgeon: Judeth Horn, MD;  Location: [place];  Service: General;  Laterality: Left;   INCISION AND DRAINAGE PERIRECTAL ABSCESS N/A  07/31/2018   Procedure: IRRIGATION AND DEBRIDEMENT PERIRECTAL ABSCESS;  Surgeon: Judeth Horn, MD;  Location: [place];  Service: General;  Laterality: N/A;   INCISION AND DRAINAGE PERIRECTAL ABSCESS Left 10/03/2022   Procedure: IRRIGATION AND DEBRIDEMENT GLUTEAL ABSCESS;  Surgeon: Dwan Bolt, MD;  Location: [place];  Service: General;  Laterality: Left;   [place] N/A 09/11/2018   Procedure: PLACEMENT OF SETON;  Surgeon: Ileana Roup, MD;  Location: [place];  Service: General;  Laterality: N/A;   RECTAL EXAM UNDER ANESTHESIA N/A 11/20/2018   Procedure: ANORECTAL EXAM UNDER ANESTHESIA;  Surgeon: Ileana Roup, MD;  Location: [facility];  Service: General;  Laterality: N/A;   RECTAL EXAM UNDER ANESTHESIA N/A 02/14/2020   Procedure: ANORECTAL EXAM UNDER ANESTHESIA;  Surgeon: Ileana Roup, MD;  Location: [place];  Service: General;  Laterality: N/A;   TRANSTHORACIC ECHOCARDIOGRAM  11/29/2011   moderate LVH, ef 55-60%,  grade 2 diastolic dysfunction/  trivial MR and TR/  mild LAE    Family History  Problem Relation Age of Onset   Hypertension Mother    Hypertension Father    Cancer Sister 70       brain tumor   Stroke Sister     Social:  reports that he quit smoking about 13 years ago. His smoking use included cigarettes. He has a 7.50 pack-year smoking history. He has never used smokeless tobacco. He reports current alcohol use of about 24.0 standard drinks of alcohol per week. He reports current drug use. Drug: Marijuana.  Allergies: No Known Allergies  Medications: I have reviewed the patient's current medications.  Results for orders placed or performed during the hospital encounter of 11/01/22 (from the past 48 hour(s))  Glucose, capillary     Status: Abnormal   Collection Time: 11/01/22  8:44 AM  Result Value Ref Range   Glucose-Capillary 121 (H) 70 - 99 mg/dL    Comment: Glucose reference range applies only to samples  taken after fasting for at least 8 hours.   Comment 1 Notify RN    Comment 2 Document in Chart     No results found.   PE Blood pressure 139/88, pulse (!) 55, temperature 98.8 F (37.1 C), temperature source Oral, resp. rate 20, height 6' (1.829 m), weight 132.5 kg, SpO2 97 %. Constitutional: NAD; conversant Eyes: Moist conjunctiva; no lid lag; anicteric Lungs: Normal respiratory effort CV: RRR; no pitting edema MSK: Normal range of motion of extremities Psychiatric: Appropriate affect  Results for orders placed or performed during the hospital encounter of 11/01/22 (from the past 48 hour(s))  Glucose, capillary     Status: Abnormal   Collection Time: 11/01/22  8:44 AM  Result Value Ref Range   Glucose-Capillary 121 (H) 70 - 99 mg/dL    Comment: Glucose reference range applies only to samples taken after fasting for at least 8 hours.   Comment 1 Notify RN    Comment 2 Document in Chart     No results found.  A/P: Dvid Pendry is an 46 y.o. male with hx of anal abscess/fistula here for evaluation of likely recurrent anal fistula - MRI Confirmed 10/01/22 - posteriorly based - I&D 10/03/22  Pelvic MRI as above  -In the process of establishing care with gastroenterology - [place]  -He is already scheduled for later this November for planned EUA, possible placement of draining seton.   -The anatomy and physiology of the anal canal was discussed with the patient with associated pictures. The pathophysiology of anal abscess and fistula was discussed at length with associated pictures and illustrations. -We have reviewed options going forward including further observation vs surgery -anorectal exam under anesthesia, interrogation of anal wound to see if there is any evident fistulous.  -We discussed rationale for above given now recurrent perirectal abscess which we drained in the office today.  -The planned procedure, material risks (including, but not limited to, pain,  bleeding, infection, scarring, need for blood transfusion, damage to anal sphincter, incontinence of gas and/or stool, need for additional procedures, anal stenosis, rare cases of pelvic sepsis which in severe cases may require things like a colostomy, recurrence, pneumonia, heart attack, stroke, death) benefits and alternatives to surgery were discussed at length. I noted a good probability that the procedure would help improve their symptoms. The patient's questions were answered to his satisfaction, he voiced understanding and elected to proceed with surgery. Additionally, we discussed typical postoperative expectations and the recovery process.  Nadeen Landau, [facility], [place]

## 2022-11-01 NOTE — Discharge Instructions (Addendum)
ANORECTAL SURGERY: POST OP INSTRUCTIONS  DIET: Follow a light bland diet the first 24 hours after arrival home, such as soup, liquids, crackers, etc.  Be sure to include lots of fluids daily.  Avoid fast food or heavy meals as your are more likely to get nauseated.  Eat a low fat diet the next few days after surgery.   Some bleeding with bowel movements is expected for the first couple of days but this should stop in between bowel movements  Take your usually prescribed home medications unless otherwise directed. No foreign bodies per rectum for the next 3 months (enemas, etc)  PAIN CONTROL: It is helpful to take an over-the-counter pain medication regularly for the first few days/weeks.  Choose from the following that works best for you: Ibuprofen (Advil, etc) Three 200mg tabs every 6 hours as needed. Acetaminophen (Tylenol, etc) 500-650mg every 6 hours as needed NOTE: You may take both of these medications together - most patients find it most helpful when alternating between the two (i.e. Ibuprofen at 6am, tylenol at 9am, ibuprofen at 12pm ...) A  prescription for pain medication may have been prescribed for you at discharge.  Take your pain medication as prescribed.  If you are having problems/concerns with the prescription medicine, please call us for further advice.  Avoid getting constipated.  Between the surgery and the pain medications, it is common to experience some constipation.  Increasing fluid intake (64oz of water per day) and taking a fiber supplement (such as Metamucil, Citrucel, FiberCon) 1-2 times a day regularly will usually help prevent this problem from occurring.  Take Miralax (over the counter) 1-2x/day while taking a narcotic pain medication. If no bowel movement after 48hours, you may additionally take a laxative like a bottle of Milk of Magnesia which can be purchased over the counter. Avoid enemas.   Watch out for diarrhea.  If you have many loose bowel movements,  simplify your diet to bland foods.  Stop any stool softeners and decrease your fiber supplement. If this worsens or does not improve, please call us.  Wash / shower every day.  If you were discharged with a dressing, you may remove this the day after your surgery. You may shower normally, getting soap/water on your wound, particularly after bowel movements.  Soaking in a warm bath filled a couple inches ("Sitz bath") is a great way to clean the area after a bowel movement and many patients find it is a way to soothe the area.  ACTIVITIES as tolerated:   You may resume regular (light) daily activities beginning the next day--such as daily self-care, walking, climbing stairs--gradually increasing activities as tolerated.  If you can walk 30 minutes without difficulty, it is safe to try more intense activity such as jogging, treadmill, bicycling, low-impact aerobics, etc. Refrain from any heavy lifting or straining for the first 2 weeks after your procedure, particularly if your surgery was for hemorrhoids. Avoid activities that make your pain worse You may drive when you are no longer taking prescription pain medication, you can comfortably wear a seatbelt, and you can safely maneuver your car and apply brakes.  FOLLOW UP in our office Please call [facility] at [phone] to set up an appointment to see your surgeon in the office for a follow-up appointment approximately 2 weeks after your surgery. Make sure that you call for this appointment the day you arrive home to insure a convenient appointment time.  9. If you have disability or family leave forms   that need to be completed, you may have them completed by your primary care physician's office; for return to work instructions, please ask our office staff and they will be happy to assist you in obtaining this documentation   When to call us [phone]: Poor pain control Reactions / problems with new medications (rash/itching, etc)  Fever over  101.5 F (38.5 C) Inability to urinate Nausea/vomiting Worsening swelling or bruising Continued bleeding from incision. Increased pain, redness, or drainage from the incision  The clinic staff is available to answer your questions during regular business hours (8:30am-5pm).  Please don't hesitate to call and ask to speak to one of our nurses for clinical concerns.   A surgeon from [facility] is always on call at the hospitals   If you have a medical emergency, go to the nearest emergency room or call 911.   [facility] [address], [address], [place], [place]  [address] MAIN: [phone] FAX: [phone] [email or web address] 

## 2022-11-01 NOTE — Anesthesia Procedure Notes (Signed)
Procedure Name: Intubation Date/Time: 11/01/2022 10:34 AM  Performed by: Niel Hummer, CRNAPre-anesthesia Checklist: Patient identified, Emergency Drugs available, Suction available and Patient being monitored Patient Re-evaluated:Patient Re-evaluated prior to induction Oxygen Delivery Method: Circle system utilized Preoxygenation: Pre-oxygenation with 100% oxygen Induction Type: IV induction Ventilation: Mask ventilation without difficulty Laryngoscope Size: Mac and 4 Grade View: Grade I Tube type: Oral Tube size: 7.5 mm Number of attempts: 1 Airway Equipment and Method: Stylet Placement Confirmation: ETT inserted through vocal cords under direct vision, positive ETCO2 and breath sounds checked- equal and bilateral Secured at: 23 cm Tube secured with: Tape Dental Injury: Teeth and Oropharynx as per pre-operative assessment

## 2022-11-01 NOTE — Anesthesia Postprocedure Evaluation (Signed)
Anesthesia Post Note  Patient: Martin Rose  Procedure(s) Performed: ANORECTAL EXAM UNDER ANESTHESIA, INTERROGATION OF PERIRECTAL WOUND PARTIAL FISTULOTOMY AND PLACEMENT OF DRAINING SETON     Patient location during evaluation: PACU Anesthesia Type: General Level of consciousness: awake and alert Pain management: pain level controlled Vital Signs Assessment: post-procedure vital signs reviewed and stable Respiratory status: spontaneous breathing, nonlabored ventilation and respiratory function stable Cardiovascular status: stable and blood pressure returned to baseline Anesthetic complications: no   No notable events documented.  Last Vitals:  Vitals:   11/01/22 1230 11/01/22 1239  BP:  (!) 145/99  Pulse: (!) 51 (!) 50  Resp: 16 16  Temp:    SpO2: 98% 100%    Last Pain:  Vitals:   11/01/22 1215  TempSrc:   PainSc: 0-No pain                 Beryle Lathe

## 2022-11-02 ENCOUNTER — Encounter (HOSPITAL_COMMUNITY): Payer: Self-pay | Admitting: Surgery

## 2022-12-01 ENCOUNTER — Telehealth: Payer: Self-pay | Admitting: Gastroenterology

## 2022-12-01 NOTE — Telephone Encounter (Signed)
Good morning Dr. Chales Abrahams!!  (Supervising Doc for 12/01/22, a.m.)  This patient came into the office bringing his medical records from [facility], where his last doctor has since retired.  His last procedure was in 2020.  He has since moved to [place] and would like to establish care with our practice.  His records are being forwarded to you for consideration.  Please let me know how you would like to proceed.  Thank you.

## 2022-12-02 NOTE — Telephone Encounter (Signed)
Okay to schedule with me or APP clinic, whichever is faster RG

## 2023-01-03 ENCOUNTER — Encounter: Payer: Self-pay | Admitting: Physician Assistant

## 2023-01-03 NOTE — Telephone Encounter (Signed)
Records in app drawer. Thank you

## 2023-01-03 NOTE — Telephone Encounter (Signed)
LVM for patient to call to schedule an appointment. Records in drawer

## 2023-01-03 NOTE — Telephone Encounter (Signed)
Patient scheduled with Ellouise Newer, PA on 2/8 at 11:30 AM.

## 2023-01-19 ENCOUNTER — Ambulatory Visit (HOSPITAL_BASED_OUTPATIENT_CLINIC_OR_DEPARTMENT_OTHER): Payer: 59 | Admitting: Family Medicine

## 2023-01-20 ENCOUNTER — Ambulatory Visit (INDEPENDENT_AMBULATORY_CARE_PROVIDER_SITE_OTHER): Payer: 59 | Admitting: Physician Assistant

## 2023-01-20 ENCOUNTER — Encounter: Payer: Self-pay | Admitting: Physician Assistant

## 2023-01-20 VITALS — BP 122/82 | HR 75 | Ht 72.0 in | Wt 287.0 lb

## 2023-01-20 DIAGNOSIS — Z1211 Encounter for screening for malignant neoplasm of colon: Secondary | ICD-10-CM

## 2023-01-20 DIAGNOSIS — Z8719 Personal history of other diseases of the digestive system: Secondary | ICD-10-CM

## 2023-01-20 MED ORDER — NA SULFATE-K SULFATE-MG SULF 17.5-3.13-1.6 GM/177ML PO SOLN: 1.0000 | Freq: Once | ORAL | 0 refills | Status: AC

## 2023-01-20 NOTE — Progress Notes (Signed)
Chief Complaint: Discuss colonoscopy  HPI:    Mr. Bunyard is a 47 year old African-American male with a past medical history as listed below including perirectal fistula, who was referred to me by de [place], Blondell Reveal, MD for a can colonoscopy.    Per review of previous notes patient had a history of perirectal abscess drained twice as well as perirectal fistulas repaired surgically in 2019.  He initially was referred to [facility] to rule out Crohn's.    07/24/2019 colonoscopy with good prep, TI intubated for 15 cm, mucosa was normal, medium internal hemorrhoids.  Repeat recommended at the age of 60 with 2 days of prep.    Today, the patient tells me that he follows with Annye English at [facility] in town for his chronic rectal abscesses and fistulas.  Apparently saw him last in November him is just come up with another abscess and has a "string hanging out of it".  Tells me he is very happy with his care there but Dr. Dema Severin wanted him to have a repeat colonoscopy since he is now over the age of 31 and these continue to occur.    Denies fever, chills, weight loss or blood in his stool.  Past Medical History:  Diagnosis Date   History of rectal abscess    Hypertension    Palpitations 07/11/2017   per pt only feels when he over exerts himself without sob   Perirectal fistula    wound/  left anterior   Pre-diabetes     Past Surgical History:  Procedure Laterality Date   ANAL FISTULOTOMY N/A 11/20/2018   Procedure: FISTULOTOMY placement cutting seton;  Surgeon: Ileana Roup, MD;  [facility];  Service: General;  Laterality: N/A;   CARDIAC CATHETERIZATION  07-20-2006   dr Tami Ribas   abnormal cardiolite--  no sig. cad, normal LVF, ef 60%, systemic HTN, elevated LVEDP (22)   FISTULOTOMY N/A 09/11/2018   Procedure: FISTULOTOMY;  Surgeon: Ileana Roup, MD;  Location: [place];  Service: General;  Laterality: N/A;   FISTULOTOMY N/A 02/14/2020   Procedure:  INCISION AND DRAINAGE OF PERIANAL COLLECTION;  Surgeon: Ileana Roup, MD;  Location: [place];  Service: General;  Laterality: N/A;   INCISION AND DRAINAGE PERIRECTAL ABSCESS Left 03/22/2018   Procedure: IRRIGATION AND DEBRIDEMENT PERIRECTAL ABSCESS;  Surgeon: Judeth Horn, MD;  Location: [place];  Service: General;  Laterality: Left;   [place] N/A 07/31/2018   Procedure: IRRIGATION AND DEBRIDEMENT PERIRECTAL ABSCESS;  Surgeon: Judeth Horn, MD;  Location: [place];  Service: General;  Laterality: N/A;   INCISION AND DRAINAGE PERIRECTAL ABSCESS Left 10/03/2022   Procedure: IRRIGATION AND DEBRIDEMENT GLUTEAL ABSCESS;  Surgeon: Dwan Bolt, MD;  Location: [place];  Service: General;  Laterality: Left;   [place] N/A 09/11/2018   Procedure: PLACEMENT OF SETON;  Surgeon: Ileana Roup, MD;  Location: [place];  Service: General;  Laterality: N/A;   PLACEMENT OF SETON N/A 11/01/2022   Procedure: PARTIAL FISTULOTOMY AND PLACEMENT OF DRAINING SETON;  Surgeon: Ileana Roup, MD;  [facility];  Service: General;  Laterality: N/A;   RECTAL EXAM UNDER ANESTHESIA N/A 11/20/2018   Procedure: ANORECTAL EXAM UNDER ANESTHESIA;  Surgeon: Ileana Roup, MD;  [facility];  Service: General;  Laterality: N/A;   RECTAL EXAM UNDER ANESTHESIA N/A 02/14/2020   Procedure: ANORECTAL EXAM UNDER ANESTHESIA;  Surgeon: Ileana Roup, MD;  Location: [place];  Service: General;  Laterality: N/A;   RECTAL EXAM UNDER ANESTHESIA N/A 11/01/2022   Procedure: ANORECTAL EXAM UNDER ANESTHESIA, INTERROGATION OF PERIRECTAL WOUND;  Surgeon: Ileana Roup, MD;  [facility];  Service: General;  Laterality: N/A;   TRANSTHORACIC ECHOCARDIOGRAM  11/29/2011   moderate LVH, ef 55-60%,  grade 2 diastolic dysfunction/  trivial MR and TR/  mild LAE    Current Outpatient Medications  Medication Sig  Dispense Refill   amLODipine (NORVASC) 10 MG tablet Take 1 tablet (10 mg total) by mouth daily. 90 tablet 1   atenolol (TENORMIN) 50 MG tablet Take 1 tablet (50 mg total) by mouth daily. 90 tablet 1   No current facility-administered medications for this visit.    Allergies as of 01/20/2023   (No Known Allergies)    Family History  Problem Relation Age of Onset   Hypertension Mother    Hypertension Father    Cancer Sister 67       brain tumor   Stroke Sister    High blood pressure Maternal Grandmother    High blood pressure Maternal Grandfather    High blood pressure Paternal Grandmother    Colon cancer Neg Hx    Esophageal cancer Neg Hx    Liver disease Neg Hx     Social History   Socioeconomic History   Marital status: Married    Spouse name: Not on file   Number of children: 3   Years of education: Not on file   Highest education level: Not on file  Occupational History   Occupation: self empolyed  Tobacco Use   Smoking status: Former    Packs/day: 0.50    Years: 15.00    Total pack years: 7.50    Types: Cigarettes    Quit date: 2010    Years since quitting: 14.1   Smokeless tobacco: Never  Vaping Use   Vaping Use: Never used  Substance and Sexual Activity   Alcohol use: Yes    Alcohol/week: 24.0 standard drinks of alcohol    Types: 24 Cans of beer per week    Comment: 2 beers a day   Drug use: Yes    Types: Marijuana    Comment: 02-08-2020 "2- times/wk"   Sexual activity: Yes  Other Topics Concern   Not on file  Social History Narrative   Not on file   Social Determinants of Health   Financial Resource Strain: Not on file  Food Insecurity: Not on file  Transportation Needs: Not on file  Physical Activity: Not on file  Stress: Not on file  Social Connections: Not on file  Intimate Partner Violence: Not on file    Review of Systems:    Constitutional: No weight loss, fever or chills Skin: No rash  Cardiovascular: No chest pain    Respiratory: No SOB  Gastrointestinal: See HPI and otherwise negative Genitourinary: No dysuria  Neurological: No headache, dizziness or syncope Musculoskeletal: No new muscle or joint pain Hematologic: No bleeding  Psychiatric: No history of depression or anxiety   Physical Exam:  Vital signs: BP 122/82   Pulse 75   Ht 6' (1.829 m)   Wt 287 lb (130.2 kg)   SpO2 99%   BMI 38.92 kg/m    Constitutional:   Pleasant obese AA male appears to be in NAD, Well developed, Well nourished, alert and cooperative Head:  Normocephalic and atraumatic. Eyes:   PEERL, EOMI. No icterus. Conjunctiva pink. Ears:  Normal auditory acuity. Neck:  Supple Throat: Oral cavity and pharynx without inflammation, swelling or lesion.  Respiratory: Respirations even and unlabored. Lungs clear to auscultation bilaterally.   No wheezes, crackles, or rhonchi.  Cardiovascular: Normal S1, S2. No MRG. Regular rate and rhythm. No peripheral edema, cyanosis or pallor.  Gastrointestinal:  Soft, nondistended, nontender. No rebound or guarding. Normal bowel sounds. No appreciable masses or hepatomegaly. Rectal:  Not performed.  Msk:  Symmetrical without gross deformities. Without edema, no deformity or joint abnormality.  Neurologic:  Alert and  oriented x4;  grossly normal neurologically.  Skin:   Dry and intact without significant lesions or rashes. Psychiatric: Demonstrates good judgement and reason without abnormal affect or behaviors.  RELEVANT LABS AND IMAGING: CBC    Component Value Date/Time   WBC 8.9 10/28/2022 1430   RBC 4.78 10/28/2022 1430   HGB 12.7 (L) 10/28/2022 1430   HGB 13.4 06/22/2022 1403   HCT 39.8 10/28/2022 1430   HCT 41.3 06/22/2022 1403   PLT 241 10/28/2022 1430   PLT 290 06/22/2022 1403   MCV 83.3 10/28/2022 1430   MCV 82 06/22/2022 1403   MCH 26.6 10/28/2022 1430   MCHC 31.9 10/28/2022 1430   RDW 15.5 10/28/2022 1430   RDW 14.2 06/22/2022 1403   LYMPHSABS 3.2 10/01/2022 0146    LYMPHSABS 3.8 (H) 06/22/2022 1403   MONOABS 1.3 (H) 10/01/2022 0146   EOSABS 0.2 10/01/2022 0146   EOSABS 0.4 06/22/2022 1403   BASOSABS 0.1 10/01/2022 0146   BASOSABS 0.1 06/22/2022 1403    CMP     Component Value Date/Time   NA 141 10/28/2022 1430   NA 140 06/22/2022 1403   K 4.6 10/28/2022 1430   CL 109 10/28/2022 1430   CO2 27 10/28/2022 1430   GLUCOSE 98 10/28/2022 1430   BUN 13 10/28/2022 1430   BUN 13 06/22/2022 1403   CREATININE 1.13 10/28/2022 1430   CREATININE 1.08 08/09/2016 1506   CALCIUM 9.2 10/28/2022 1430   PROT 7.0 06/22/2022 1403   ALBUMIN 4.3 06/22/2022 1403   AST 18 06/22/2022 1403   ALT 25 06/22/2022 1403   ALKPHOS 80 06/22/2022 1403   BILITOT 0.4 06/22/2022 1403   GFRNONAA >60 10/28/2022 1430   GFRNONAA 85 08/09/2016 1506   GFRAA >60 05/28/2020 1938   GFRAA >89 08/09/2016 1506    Assessment: 1.  History of recurrent rectal abscess and fistula: Follows with Dr. Annye English at [facility], has a current rectal abscess they are caring for 2.  Surveillance colonoscopy: Given history of recurrent abscess and fistula patient is due for a repeat colonoscopy, the last in 2020 was normal  Plan: 1.  Scheduled patient for a diagnostic colonoscopy in the [place] given recurrent rectal abscess and fistula.  This was scheduled with Dr. Bryan Lemma due to availability reasons.  Did provide the patient a detailed list of risks for the procedure and he agrees to proceed.  Patient will complete a 2-day bowel prep given that the prep was only "good" at time of his last colonoscopy. Patient is appropriate for endoscopic procedure(s) in the ambulatory ([place]) setting.  2.  Patient to follow in clinic per recommendations after time of procedure.  Ellouise Newer, PA-C [facility] 01/20/2023, 11:30 AM  Cc: de [place], Raymond J, MD

## 2023-01-20 NOTE — Progress Notes (Signed)
Agree with the assessment and plan as outlined by Jennifer Lemmon, PA-C. ? ?Billie Trager, DO, FACG ? ?

## 2023-01-20 NOTE — Patient Instructions (Addendum)
If you are age 47 or older, your body mass index should be between 23-30. Your Body mass index is 38.92 kg/m. If this is out of the aforementioned range listed, please consider follow up with your Primary Care Provider.  If you are age 39 or younger, your body mass index should be between 19-25. Your Body mass index is 38.92 kg/m. If this is out of the aformentioned range listed, please consider follow up with your Primary Care Provider.   You have been scheduled for a colonoscopy. Please follow written instructions given to you at your visit today.  Please pick up your prep supplies at the pharmacy within the next 1-3 days. If you use inhalers (even only as needed), please bring them with you on the day of your procedure.   2 DAY PREPARATION FOR COLONOSCOPY WITH SUPREP AND MIRALAX You can try the [employer] or GoodRx for a generic Suprep coupon!!  In addition to [employer] at your pharmacy, please purchase the following over the counter: One 119 gram bottle of Miralax powder One box of Dulcolax Laxative 5 mg tablets (you will need 4 tablets)  One 32 oz. bottle of Gatorade (NO RED OR PURPLE)  2 DAYS BEFORE PROCEDURE :         DATE: 01/25/23     DAY: Tuesday  In the morning, mix into a pitcher the entire 119 gram  bottle of Miralax with a 32 oz  room temperature Gatorade (no red,no purple) stir to dissolve the powder and refrigerate.    Eat a regular diet minus the above foods- eat breakfast , lunch and an early dinner between 4-5 pm   At 3:00 pm take 4 Dulcolax tablets.  4.   Eat dinner between 4-5 pm         5.   Between 5:00 pm and 7:00 pm, take the Miralax mixture from the refrigerator and Drink a 8 oz.glass of Miralax mixture every 15 minutes until the solution is gone.  6. Once you have completed this prep, you are ONLY ALLOWED clear liquids no more solid foods   It was a pleasure to see you today!  Thank you for trusting me with your gastrointestinal care!     Ellouise Newer, PA-C

## 2023-01-25 ENCOUNTER — Ambulatory Visit (INDEPENDENT_AMBULATORY_CARE_PROVIDER_SITE_OTHER): Payer: 59 | Admitting: Family Medicine

## 2023-01-25 DIAGNOSIS — Z23 Encounter for immunization: Secondary | ICD-10-CM

## 2023-01-27 ENCOUNTER — Encounter: Payer: Self-pay | Admitting: Gastroenterology

## 2023-01-27 ENCOUNTER — Ambulatory Visit (AMBULATORY_SURGERY_CENTER): Payer: 59 | Admitting: Gastroenterology

## 2023-01-27 VITALS — BP 138/81 | HR 65 | Temp 97.8°F | Resp 14 | Ht 72.0 in | Wt 287.8 lb

## 2023-01-27 DIAGNOSIS — R7303 Prediabetes: Secondary | ICD-10-CM | POA: Diagnosis not present

## 2023-01-27 DIAGNOSIS — Z8719 Personal history of other diseases of the digestive system: Secondary | ICD-10-CM

## 2023-01-27 DIAGNOSIS — I10 Essential (primary) hypertension: Secondary | ICD-10-CM | POA: Diagnosis not present

## 2023-01-27 DIAGNOSIS — Z1211 Encounter for screening for malignant neoplasm of colon: Secondary | ICD-10-CM

## 2023-01-27 MED ORDER — SODIUM CHLORIDE 0.9 % IV SOLN: 500.0000 mL | Freq: Once | INTRAVENOUS | Status: DC

## 2023-01-27 NOTE — Patient Instructions (Signed)
Resume previous diet and continue present medications. Repeat colonoscopy in 10 years for surveillance. Follow-up with Dr. Dema Severin in the [facility]  Return to GI office as needed   YOU HAD AN ENDOSCOPIC PROCEDURE TODAY AT [place]:   Refer to the procedure report that was given to you for any specific questions about what was found during the examination.  If the procedure report does not answer your questions, please call your gastroenterologist to clarify.  If you requested that your care partner not be given the details of your procedure findings, then the procedure report has been included in a sealed envelope for you to review at your convenience later.  YOU SHOULD EXPECT: Some feelings of bloating in the abdomen. Passage of more gas than usual.  Walking can help get rid of the air that was put into your GI tract during the procedure and reduce the bloating. If you had a lower endoscopy (such as a colonoscopy or flexible sigmoidoscopy) you may notice spotting of blood in your stool or on the toilet paper. If you underwent a bowel prep for your procedure, you may not have a normal bowel movement for a few days.  Please Note:  You might notice some irritation and congestion in your nose or some drainage.  This is from the oxygen used during your procedure.  There is no need for concern and it should clear up in a day or so.  SYMPTOMS TO REPORT IMMEDIATELY:  Following lower endoscopy (colonoscopy or flexible sigmoidoscopy):  Excessive amounts of blood in the stool  Significant tenderness or worsening of abdominal pains  Swelling of the abdomen that is new, acute  Fever of 100F or higher  For urgent or emergent issues, a gastroenterologist can be reached at any hour by calling [phone]. Do not use MyChart messaging for urgent concerns.    DIET:  We do recommend a small meal at first, but then you may proceed to your regular diet.  Drink plenty of  fluids but you should avoid alcoholic beverages for 24 hours.  ACTIVITY:  You should plan to take it easy for the rest of today and you should NOT DRIVE or use heavy machinery until tomorrow (because of the sedation medicines used during the test).    FOLLOW UP: Our staff will call the number listed on your records the next business day following your procedure.  We will call around 7:15- 8:00 am to check on you and address any questions or concerns that you may have regarding the information given to you following your procedure. If we do not reach you, we will leave a message.     If any biopsies were taken you will be contacted by phone or by letter within the next 1-3 weeks.  Please call us at [phone] if you have not heard about the biopsies in 3 weeks.    SIGNATURES/CONFIDENTIALITY: You and/or your care partner have signed paperwork which will be entered into your electronic medical record.  These signatures attest to the fact that that the information above on your After Visit Summary has been reviewed and is understood.  Full responsibility of the confidentiality of this discharge information lies with you and/or your care-partner.

## 2023-01-27 NOTE — Progress Notes (Signed)
Pt's states no medical or surgical changes since previsit or office visit. 

## 2023-01-27 NOTE — Progress Notes (Signed)
GASTROENTEROLOGY PROCEDURE H&P NOTE   Primary Care Physician: de [place], Blondell Reveal, MD    Reason for Procedure:   Hx of perirectal fistula, perirectal abscess, CRC screening  Plan:    Colonoscopy  Patient is appropriate for endoscopic procedure(s) in the ambulatory ([place]) setting.  The nature of the procedure, as well as the risks, benefits, and alternatives were carefully and thoroughly reviewed with the patient. Ample time for discussion and questions allowed. The patient understood, was satisfied, and agreed to proceed.     HPI: Martin Rose is a 47 y.o. male who presents for colonoscopy for CRC screening and evaluation of history of perirectal abscess and fistulae.  Patient was most recently seen in the Gastroenterology Clinic on 01/20/2023 by Ellouise Newer and referred by Dr. Dema Severin in [place].  No interval change in medical history since that appointment. Please refer to that note for full details regarding GI history and clinical presentation. He completed extended 2-day prep for colonoscopy today.  Past Medical History:  Diagnosis Date   History of rectal abscess    Hypertension    Palpitations 07/11/2017   per pt only feels when he over exerts himself without sob   Perirectal fistula    wound/  left anterior   Pre-diabetes     Past Surgical History:  Procedure Laterality Date   ANAL FISTULOTOMY N/A 11/20/2018   Procedure: FISTULOTOMY placement cutting seton;  Surgeon: Ileana Roup, MD;  [facility];  Service: General;  Laterality: N/A;   CARDIAC CATHETERIZATION  07-20-2006   dr Tami Ribas   abnormal cardiolite--  no sig. cad, normal LVF, ef 60%, systemic HTN, elevated LVEDP (22)   FISTULOTOMY N/A 09/11/2018   Procedure: FISTULOTOMY;  Surgeon: Ileana Roup, MD;  Location: [place];  Service: General;  Laterality: N/A;   FISTULOTOMY N/A 02/14/2020   Procedure: INCISION AND DRAINAGE OF PERIANAL COLLECTION;  Surgeon: Ileana Roup,  MD;  Location: [place];  Service: General;  Laterality: N/A;   INCISION AND DRAINAGE PERIRECTAL ABSCESS Left 03/22/2018   Procedure: IRRIGATION AND DEBRIDEMENT PERIRECTAL ABSCESS;  Surgeon: Judeth Horn, MD;  Location: [place];  Service: General;  Laterality: Left;   [place] N/A 07/31/2018   Procedure: IRRIGATION AND DEBRIDEMENT PERIRECTAL ABSCESS;  Surgeon: Judeth Horn, MD;  Location: [place];  Service: General;  Laterality: N/A;   INCISION AND DRAINAGE PERIRECTAL ABSCESS Left 10/03/2022   Procedure: IRRIGATION AND DEBRIDEMENT GLUTEAL ABSCESS;  Surgeon: Dwan Bolt, MD;  Location: [place];  Service: General;  Laterality: Left;   [place] N/A 09/11/2018   Procedure: PLACEMENT OF SETON;  Surgeon: Ileana Roup, MD;  Location: [place];  Service: General;  Laterality: N/A;   PLACEMENT OF SETON N/A 11/01/2022   Procedure: PARTIAL FISTULOTOMY AND PLACEMENT OF DRAINING SETON;  Surgeon: Ileana Roup, MD;  [facility];  Service: General;  Laterality: N/A;   RECTAL EXAM UNDER ANESTHESIA N/A 11/20/2018   Procedure: ANORECTAL EXAM UNDER ANESTHESIA;  Surgeon: Ileana Roup, MD;  [facility];  Service: General;  Laterality: N/A;   RECTAL EXAM UNDER ANESTHESIA N/A 02/14/2020   Procedure: ANORECTAL EXAM UNDER ANESTHESIA;  Surgeon: Ileana Roup, MD;  Location: [place];  Service: General;  Laterality: N/A;   RECTAL EXAM UNDER ANESTHESIA N/A 11/01/2022   Procedure: ANORECTAL EXAM UNDER ANESTHESIA, INTERROGATION OF PERIRECTAL WOUND;  Surgeon: Ileana Roup, MD;  [facility];  Service: General;  Laterality: N/A;   TRANSTHORACIC ECHOCARDIOGRAM  11/29/2011   moderate LVH, ef 55-60%,  grade 2 diastolic dysfunction/  trivial MR and TR/  mild LAE    Prior to Admission medications   Medication Sig Start Date End Date Taking? Authorizing Provider  amLODipine (NORVASC) 10 MG  tablet Take 1 tablet (10 mg total) by mouth daily. 09/01/22  Yes de [place], Raymond J, MD  amoxicillin-clavulanate (AUGMENTIN) 875-125 MG tablet 1 tablet 2 (two) times daily. 01/20/23  Yes [provider]  atenolol (TENORMIN) 50 MG tablet Take 1 tablet (50 mg total) by mouth daily. 09/01/22  Yes [place], Raymond J, MD    Current Outpatient Medications  Medication Sig Dispense Refill   amLODipine (NORVASC) 10 MG tablet Take 1 tablet (10 mg total) by mouth daily. 90 tablet 1   amoxicillin-clavulanate (AUGMENTIN) 875-125 MG tablet 1 tablet 2 (two) times daily.     atenolol (TENORMIN) 50 MG tablet Take 1 tablet (50 mg total) by mouth daily. 90 tablet 1   Current Facility-Administered Medications  Medication Dose Route Frequency Provider Last Rate Last Admin   0.9 %  sodium chloride infusion  500 mL Intravenous Once Renn Dirocco V, DO        Allergies as of 01/27/2023   (No Known Allergies)    Family History  Problem Relation Age of Onset   Hypertension Mother    Hypertension Father    Cancer Sister 30       brain tumor   Stroke Sister    High blood pressure Maternal Grandmother    High blood pressure Maternal Grandfather    High blood pressure Paternal Grandmother    Colon cancer Neg Hx    Esophageal cancer Neg Hx    Liver disease Neg Hx    Rectal cancer Neg Hx    Stomach cancer Neg Hx     Social History   Socioeconomic History   Marital status: Married    Spouse name: Not on file   Number of children: 3   Years of education: Not on file   Highest education level: Not on file  Occupational History   Occupation: self empolyed  Tobacco Use   Smoking status: Former    Packs/day: 0.50    Years: 15.00    Total pack years: 7.50    Types: Cigarettes    Quit date: 2010    Years since quitting: 14.1   Smokeless tobacco: Never  Vaping Use   Vaping Use: Never used  Substance and Sexual Activity   Alcohol use: Yes    Alcohol/week: 24.0 standard drinks of alcohol     Types: 24 Cans of beer per week    Comment: 2 beers a day   Drug use: Yes    Types: Marijuana    Comment: "2- times/wk"   Sexual activity: Yes  Other Topics Concern   Not on file  Social History Narrative   Not on file   Social Determinants of Health   Financial Resource Strain: Not on file  Food Insecurity: Not on file  Transportation Needs: Not on file  Physical Activity: Not on file  Stress: Not on file  Social Connections: Not on file  Intimate Partner Violence: Not on file    Physical Exam: Vital signs in last 24 hours: @BP$  127/75   Pulse (!) 58   Temp 97.8 F (36.6 C) (Temporal)   Ht 6' (1.829 m)   Wt 287 lb 12.8 oz (130.5 kg)  SpO2 98%   BMI 39.03 kg/m  GEN: NAD EYE: Sclerae anicteric ENT: MMM CV: Non-tachycardic Pulm: CTA b/l GI: Soft, NT/ND NEURO:  Alert & Oriented x 3   Gerrit Heck, DO [facility]   01/27/2023 2:35 PM

## 2023-01-27 NOTE — Op Note (Signed)
[facility] Patient Name: Martin Rose Procedure Date: 01/27/2023 2:32 PM MRN: [id number] Endoscopist: Gerrit Heck , MD, [id number] Age: 47 Referring MD:  Date of Birth: 10/17/1976 Gender: Male Account #: [id number] Procedure:                Colonoscopy Indications:              Screening for colorectal malignant neoplasm                           Last colonoscopy was 07/24/2019: TI intubated for 15                            cm, mucosa was normal, medium internal hemorrhoids.                            Repeat recommended at the age of 54 with 2 days of                            prep.                           History of perianal fistulae and abscesses,                            currently with seton in place. Just finished 7 day                            course of Augmentin earlier this week for recent                            perianal abscess. Medicines:                Monitored Anesthesia Care Procedure:                Pre-Anesthesia Assessment:                           - Prior to the procedure, a History and Physical                            was performed, and patient medications and                            allergies were reviewed. The patient's tolerance of                            previous anesthesia was also reviewed. The risks                            and benefits of the procedure and the sedation                            options and risks were discussed with the patient.  All questions were answered, and informed consent                            was obtained. Prior Anticoagulants: The patient has                            taken no anticoagulant or antiplatelet agents. ASA                            Grade Assessment: II - A patient with mild systemic                            disease. After reviewing the risks and benefits,                            the patient was deemed in satisfactory condition to                             undergo the procedure.                           After obtaining informed consent, the colonoscope                            was passed under direct vision. Throughout the                            procedure, the patient's blood pressure, pulse, and                            oxygen saturations were monitored continuously. The                            CF [id number] was introduced through the anus                            and advanced to the 10 cm into the ileum. The                            colonoscopy was performed without difficulty. The                            patient tolerated the procedure well. The quality                            of the bowel preparation was good. The terminal                            ileum, ileocecal valve, appendiceal orifice, and                            rectum were photographed. Scope In: 2:45:10 PM Scope Out: 2:58:13 PM Scope Withdrawal Time: 0 hours 11 minutes 2 seconds  Total  Procedure Duration: 0 hours 13 minutes 3 seconds  Findings:                 The perianal exam findings include setons in place                            and scarring from prior perianal I&D sites.                           The entire colon appeared normal. No areas of                            mucosal erythema, edema, erosions, or ulceration.                           Non-bleeding internal hemorrhoids were found during                            retroflexion. The hemorrhoids were small.                           The terminal ileum appeared normal. Complications:            No immediate complications. Estimated Blood Loss:     Estimated blood loss: none. Impression:               Martin Rose in place and scarring from prior perianal                            I&D sites. found on perianal exam.                           - The entire examined colon is normal.                           - Non-bleeding internal hemorrhoids.                           - The  examined portion of the ileum was normal.                           - No specimens collected. Recommendation:           - Patient has a contact number available for                            emergencies. The signs and symptoms of potential                            delayed complications were discussed with the                            patient. Return to normal activities tomorrow.                            Written discharge instructions were provided to the  patient.                           - Resume previous diet.                           - Continue present medications.                           - Repeat colonoscopy in 10 years for screening                            purposes.                           - Follow-up with Dr. Dema Severin in the [facility].                           - Return to GI office PRN. Gerrit Heck, MD 01/27/2023 3:05:33 PM

## 2023-01-27 NOTE — Progress Notes (Signed)
To pacu, VSS. Report to [email or web address] 

## 2023-01-28 ENCOUNTER — Telehealth: Payer: Self-pay

## 2023-01-28 NOTE — Telephone Encounter (Signed)
Left message on follow up call. 

## 2023-02-14 DIAGNOSIS — K603 Anal fistula: Secondary | ICD-10-CM | POA: Diagnosis not present

## 2023-02-24 ENCOUNTER — Other Ambulatory Visit (HOSPITAL_BASED_OUTPATIENT_CLINIC_OR_DEPARTMENT_OTHER): Payer: Self-pay | Admitting: Family Medicine

## 2023-02-24 DIAGNOSIS — I10 Essential (primary) hypertension: Secondary | ICD-10-CM

## 2023-03-17 NOTE — Progress Notes (Signed)
Sent message, via epic in basket, requesting orders in epic from surgeon.  

## 2023-03-18 ENCOUNTER — Ambulatory Visit: Payer: Self-pay | Admitting: Surgery

## 2023-03-18 NOTE — Patient Instructions (Signed)
DUE TO COVID-19 ONLY TWO VISITORS  (aged 47 and older)  ARE ALLOWED TO COME WITH YOU AND STAY IN THE WAITING ROOM ONLY DURING PRE OP AND PROCEDURE.   **NO VISITORS ARE ALLOWED IN THE SHORT STAY AREA OR RECOVERY ROOM!!**  IF YOU WILL BE ADMITTED INTO THE HOSPITAL YOU ARE ALLOWED ONLY FOUR SUPPORT PEOPLE DURING VISITATION HOURS ONLY (7 AM -8PM)   The support person(s) must pass our screening, gel in and out, and wear a mask at all times, including in the patient's room. Patients must also wear a mask when staff or their support person are in the room. Visitors GUEST BADGE MUST BE WORN VISIBLY  One adult visitor may remain with you overnight and MUST be in the room by 8 P.M.     Your procedure is scheduled on: 03/25/23   [facility]    Report to admitting at : 1:00 PM   Call this number if you have problems the morning of surgery [phone]  FOLLOW BOWEL PREP AND ANY ADDITIONAL PRE OP INSTRUCTIONS YOU RECEIVED FROM YOUR SURGEON'S OFFICE!!!   Oral Hygiene is also important to reduce your risk of infection.                                    Remember - BRUSH YOUR TEETH THE MORNING OF SURGERY WITH YOUR REGULAR TOOTHPASTE  DENTURES WILL BE REMOVED PRIOR TO SURGERY PLEASE DO NOT APPLY "Poly grip" OR ADHESIVES!!!   Do NOT smoke after Midnight   Take these medicines the morning of surgery with A SIP OF WATER: atenolol,amlodipine.  DO NOT TAKE ANY ORAL DIABETIC MEDICATIONS DAY OF YOUR SURGERY  Bring CPAP mask and tubing day of surgery.                              You may not have any metal on your body including hair pins, jewelry, and body piercing             Do not wear  lotions, powders, perfumes/cologne, or deodorant              Men may shave face and neck.   Do not bring valuables to the hospital. [facility] IS NOT             RESPONSIBLE   FOR VALUABLES.   Contacts, glasses, or bridgework may not be worn into surgery.   Bring small  overnight bag day of surgery.   DO NOT BRING YOUR HOME MEDICATIONS TO THE HOSPITAL. PHARMACY WILL DISPENSE MEDICATIONS LISTED ON YOUR MEDICATION LIST TO YOU DURING YOUR ADMISSION IN THE HOSPITAL!    Patients discharged on the day of surgery will not be allowed to drive home.  Someone NEEDS to stay with you for the first 24 hours after anesthesia.   Special Instructions: Bring a copy of your healthcare power of attorney and living will documents         the day of surgery if you haven't scanned them before.              Please read over the following fact sheets you were given: IF YOU HAVE QUESTIONS ABOUT YOUR PRE-OP INSTRUCTIONS PLEASE CALL [phone]    [facility] - Preparing for [facility] Before surgery, you can play an important role.  Because skin is not sterile,  your skin needs to be as free of germs as possible.  You can reduce the number of germs on your skin by washing with CHG (chlorahexidine gluconate) soap before surgery.  CHG is an antiseptic cleaner which kills germs and bonds with the skin to continue killing germs even after washing. Please DO NOT use if you have an allergy to CHG or antibacterial soaps.  If your skin becomes reddened/irritated stop using the CHG and inform your nurse when you arrive at Short Stay. Do not shave (including legs and underarms) for at least 48 hours prior to the first CHG shower.  You may shave your face/neck. Please follow these instructions carefully:  1.  Shower with CHG Soap the night before surgery and the  morning of Surgery.  2.  If you choose to wash your hair, wash your hair first as usual with your  normal  shampoo.  3.  After you shampoo, rinse your hair and body thoroughly to remove the  shampoo.                           4.  Use CHG as you would any other liquid soap.  You can apply chg directly  to the skin and wash                       Gently with a scrungie or clean washcloth.  5.  Apply the CHG Soap to your body ONLY FROM THE NECK  DOWN.   Do not use on face/ open                           Wound or open sores. Avoid contact with eyes, ears mouth and genitals (private parts).                       Wash face,  Genitals (private parts) with your normal soap.             6.  Wash thoroughly, paying special attention to the area where your surgery  will be performed.  7.  Thoroughly rinse your body with warm water from the neck down.  8.  DO NOT shower/wash with your normal soap after using and rinsing off  the CHG Soap.                9.  Pat yourself dry with a clean towel.            10.  Wear clean pajamas.            11.  Place clean sheets on your bed the night of your first shower and do not  sleep with pets. Day of Surgery : Do not apply any lotions/deodorants the morning of surgery.  Please wear clean clothes to the hospital/surgery center.  FAILURE TO FOLLOW THESE INSTRUCTIONS MAY RESULT IN THE CANCELLATION OF YOUR SURGERY PATIENT SIGNATURE_________________________________  NURSE SIGNATURE__________________________________  ________________________________________________________________________

## 2023-03-21 ENCOUNTER — Encounter (HOSPITAL_COMMUNITY)
Admission: RE | Admit: 2023-03-21 | Discharge: 2023-03-21 | Disposition: A | Discharge status: Home / Self Care | Payer: 59 | Source: Ambulatory Visit | Attending: Anesthesiology | Admitting: Anesthesiology

## 2023-03-21 DIAGNOSIS — I10 Essential (primary) hypertension: Secondary | ICD-10-CM

## 2023-03-21 DIAGNOSIS — R7303 Prediabetes: Secondary | ICD-10-CM

## 2023-03-25 ENCOUNTER — Encounter (HOSPITAL_COMMUNITY): Admission: RE | Payer: Self-pay | Source: Home / Self Care

## 2023-03-25 ENCOUNTER — Ambulatory Visit (HOSPITAL_COMMUNITY): Admission: RE | Admit: 2023-03-25 | Payer: 59 | Source: Home / Self Care | Admitting: Surgery

## 2023-03-25 SURGERY — LIGATION, INTERNAL FISTULA TRACT
Anesthesia: General

## 2023-05-23 ENCOUNTER — Ambulatory Visit (HOSPITAL_BASED_OUTPATIENT_CLINIC_OR_DEPARTMENT_OTHER): Payer: 59 | Admitting: Family Medicine

## 2023-07-21 ENCOUNTER — Other Ambulatory Visit (HOSPITAL_BASED_OUTPATIENT_CLINIC_OR_DEPARTMENT_OTHER): Payer: Self-pay | Admitting: Family Medicine

## 2023-07-21 DIAGNOSIS — I10 Essential (primary) hypertension: Secondary | ICD-10-CM

## 2023-07-25 ENCOUNTER — Other Ambulatory Visit (HOSPITAL_BASED_OUTPATIENT_CLINIC_OR_DEPARTMENT_OTHER): Payer: Self-pay | Admitting: Family Medicine

## 2023-07-25 DIAGNOSIS — I10 Essential (primary) hypertension: Secondary | ICD-10-CM

## 2023-07-27 DIAGNOSIS — K603 Anal fistula: Secondary | ICD-10-CM | POA: Diagnosis not present

## 2023-08-16 ENCOUNTER — Ambulatory Visit (HOSPITAL_BASED_OUTPATIENT_CLINIC_OR_DEPARTMENT_OTHER): Payer: 59 | Admitting: Family Medicine

## 2023-08-25 ENCOUNTER — Encounter (HOSPITAL_BASED_OUTPATIENT_CLINIC_OR_DEPARTMENT_OTHER): Payer: Self-pay | Admitting: Family Medicine

## 2023-08-25 ENCOUNTER — Ambulatory Visit (INDEPENDENT_AMBULATORY_CARE_PROVIDER_SITE_OTHER): Payer: 59 | Admitting: Family Medicine

## 2023-08-25 VITALS — BP 132/84 | HR 58 | Ht 72.0 in | Wt 279.5 lb

## 2023-08-25 DIAGNOSIS — Z0000 Encounter for general adult medical examination without abnormal findings: Secondary | ICD-10-CM | POA: Diagnosis not present

## 2023-08-25 DIAGNOSIS — I10 Essential (primary) hypertension: Secondary | ICD-10-CM | POA: Diagnosis not present

## 2023-08-25 DIAGNOSIS — R209 Unspecified disturbances of skin sensation: Secondary | ICD-10-CM | POA: Insufficient documentation

## 2023-08-25 DIAGNOSIS — Z23 Encounter for immunization: Secondary | ICD-10-CM

## 2023-08-25 NOTE — Assessment & Plan Note (Signed)
Reports that for the better part of a year he has been having some cold extremities, primarily in hands and feet bilaterally.  He indicates that this is fairly constant without much fluctuation during the day or in any given environment such as cold or hot environments.  He denies any associated numbness or tingling, no pain. On exam, extremities with normal temperature/warmth in office today.  Normal distal pulses.  Normal sensation. Uncertain cause for ongoing symptoms.  Patient indicates concern for possible iron deficiency. We can proceed with baseline labs, patient has not had physical in over 1 year.  As part of this laboratory evaluation we will screen for anemia, thyroid dysfunction.

## 2023-08-25 NOTE — Progress Notes (Signed)
    Procedures performed today:    None.  Independent interpretation of notes and tests performed by another provider:   None.  Brief History, Exam, Impression, and Recommendations:    BP 132/84 (BP Location: Left Arm, Patient Position: Sitting, Cuff Size: Normal)   Pulse (!) 58   Ht 6' (1.829 m)   Wt 279 lb 8 oz (126.8 kg)   SpO2 96%   BMI 37.91 kg/m   Essential hypertension, benign Assessment & Plan: Blood pressure borderline in office today.  Patient reports that he checks infrequently at home.  Denies any current concerns related to chest pain or headaches.  He continues with amlodipine and atenolol at this time, denies any issues with these medications. At this time, we can continue with current regimen.  Recommend intermittent monitoring of blood pressure at home, DASH diet Plan for follow-up in about 3 to 4 months to monitor progress  Orders: -     Iron, TIBC and Ferritin Panel  Cold extremities Assessment & Plan: Reports that for the better part of a year he has been having some cold extremities, primarily in hands and feet bilaterally.  He indicates that this is fairly constant without much fluctuation during the day or in any given environment such as cold or hot environments.  He denies any associated numbness or tingling, no pain. On exam, extremities with normal temperature/warmth in office today.  Normal distal pulses.  Normal sensation. Uncertain cause for ongoing symptoms.  Patient indicates concern for possible iron deficiency. We can proceed with baseline labs, patient has not had physical in over 1 year.  As part of this laboratory evaluation we will screen for anemia, thyroid dysfunction.  Orders: -     Iron, TIBC and Ferritin Panel  Wellness examination -     CBC with Differential/Platelet -     Comprehensive metabolic panel -     Hemoglobin A1c -     Lipid panel -     TSH Rfx on Abnormal to Free T4  Encounter for immunization -     Flu vaccine  trivalent PF, 6mos and older(Flulaval,Afluria,Fluarix,Fluzone)  Return in about 3 months (around 11/24/2023) for CPE with fasting labs 1 week prior.   ___________________________________________ Tucker Steedley de [place], MD, ABFM, CAQSM Primary Care and Sports Medicine [facility]

## 2023-08-25 NOTE — Assessment & Plan Note (Signed)
Blood pressure borderline in office today.  Patient reports that he checks infrequently at home.  Denies any current concerns related to chest pain or headaches.  He continues with amlodipine and atenolol at this time, denies any issues with these medications. At this time, we can continue with current regimen.  Recommend intermittent monitoring of blood pressure at home, DASH diet Plan for follow-up in about 3 to 4 months to monitor progress

## 2023-08-26 LAB — COMPREHENSIVE METABOLIC PANEL
ALT: 29 IU/L (ref 0–44)
AST: 25 IU/L (ref 0–40)
Albumin: 4 g/dL — ABNORMAL LOW (ref 4.1–5.1)
Alkaline Phosphatase: 100 IU/L (ref 44–121)
BUN/Creatinine Ratio: 8 — ABNORMAL LOW (ref 9–20)
BUN: 9 mg/dL (ref 6–24)
Bilirubin Total: <0.2 mg/dL (ref 0.0–1.2)
CO2: 24 mmol/L (ref 20–29)
Calcium: 9.3 mg/dL (ref 8.7–10.2)
Chloride: 102 mmol/L (ref 96–106)
Creatinine, Ser: 1.12 mg/dL (ref 0.76–1.27)
Globulin, Total: 2.9 g/dL (ref 1.5–4.5)
Glucose: 115 mg/dL — ABNORMAL HIGH (ref 70–99)
Potassium: 4.5 mmol/L (ref 3.5–5.2)
Sodium: 139 mmol/L (ref 134–144)
Total Protein: 6.9 g/dL (ref 6.0–8.5)
eGFR: 82 mL/min/{1.73_m2} (ref >59)

## 2023-08-26 LAB — CBC WITH DIFFERENTIAL/PLATELET
Basophils Absolute: 0.1 10*3/uL (ref 0.0–0.2)
Basos: 1 %
EOS (ABSOLUTE): 0.2 10*3/uL (ref 0.0–0.4)
Eos: 3 %
Hematocrit: 43 % (ref 37.5–51.0)
Hemoglobin: 13.9 g/dL (ref 13.0–17.7)
Immature Grans (Abs): 0 10*3/uL (ref 0.0–0.1)
Immature Granulocytes: 0 %
Lymphocytes Absolute: 2.6 10*3/uL (ref 0.7–3.1)
Lymphs: 43 %
MCH: 26.7 pg (ref 26.6–33.0)
MCHC: 32.3 g/dL (ref 31.5–35.7)
MCV: 83 fL (ref 79–97)
Monocytes Absolute: 0.7 10*3/uL (ref 0.1–0.9)
Monocytes: 12 %
Neutrophils Absolute: 2.5 10*3/uL (ref 1.4–7.0)
Neutrophils: 41 %
Platelets: 336 10*3/uL (ref 150–450)
RBC: 5.2 x10E6/uL (ref 4.14–5.80)
RDW: 14 % (ref 11.6–15.4)
WBC: 6 10*3/uL (ref 3.4–10.8)

## 2023-08-26 LAB — LIPID PANEL
Chol/HDL Ratio: 2.6 ratio (ref 0.0–5.0)
Cholesterol, Total: 115 mg/dL (ref 100–199)
HDL: 45 mg/dL (ref >39)
LDL Chol Calc (NIH): 52 mg/dL (ref 0–99)
Triglycerides: 95 mg/dL (ref 0–149)
VLDL Cholesterol Cal: 18 mg/dL (ref 5–40)

## 2023-08-26 LAB — IRON,TIBC AND FERRITIN PANEL
Ferritin: 287 ng/mL (ref 30–400)
Iron Saturation: 32 % (ref 15–55)
Iron: 89 ug/dL (ref 38–169)
Total Iron Binding Capacity: 282 ug/dL (ref 250–450)
UIBC: 193 ug/dL (ref 111–343)

## 2023-08-26 LAB — TSH RFX ON ABNORMAL TO FREE T4: TSH: 0.791 u[IU]/mL (ref 0.450–4.500)

## 2023-08-26 LAB — HEMOGLOBIN A1C
Est. average glucose Bld gHb Est-mCnc: 128 mg/dL
Hgb A1c MFr Bld: 6.1 % — ABNORMAL HIGH (ref 4.8–5.6)

## 2023-08-29 ENCOUNTER — Other Ambulatory Visit (HOSPITAL_BASED_OUTPATIENT_CLINIC_OR_DEPARTMENT_OTHER): Payer: Self-pay | Admitting: Family Medicine

## 2023-08-29 DIAGNOSIS — I10 Essential (primary) hypertension: Secondary | ICD-10-CM

## 2023-09-14 ENCOUNTER — Encounter (HOSPITAL_BASED_OUTPATIENT_CLINIC_OR_DEPARTMENT_OTHER): Payer: Self-pay | Admitting: Family Medicine

## 2023-09-14 ENCOUNTER — Ambulatory Visit (INDEPENDENT_AMBULATORY_CARE_PROVIDER_SITE_OTHER): Payer: 59 | Admitting: Family Medicine

## 2023-09-14 ENCOUNTER — Ambulatory Visit (INDEPENDENT_AMBULATORY_CARE_PROVIDER_SITE_OTHER): Payer: 59

## 2023-09-14 VITALS — BP 128/98 | HR 60 | Temp 97.7°F | Ht 72.0 in | Wt 279.0 lb

## 2023-09-14 DIAGNOSIS — R200 Anesthesia of skin: Secondary | ICD-10-CM

## 2023-09-14 DIAGNOSIS — R202 Paresthesia of skin: Secondary | ICD-10-CM | POA: Diagnosis not present

## 2023-09-14 DIAGNOSIS — M47812 Spondylosis without myelopathy or radiculopathy, cervical region: Secondary | ICD-10-CM | POA: Diagnosis not present

## 2023-09-14 NOTE — Progress Notes (Signed)
    Procedures performed today:    None.  Independent interpretation of notes and tests performed by another provider:   None.  Brief History, Exam, Impression, and Recommendations:    BP (!) 128/98 (BP Location: Right Arm, Patient Position: Sitting, Cuff Size: Normal)   Pulse 60   Temp 97.7 F (36.5 C) (Oral)   Ht 6' (1.829 m)   Wt 279 lb (126.6 kg)   SpO2 100%   BMI 37.84 kg/m   Arm numbness Assessment & Plan: Started about 1 week ago, patient noted some numbness of right arm.  He reports that this was noted initially when he was applying deodorant and under right arm he noted area of numbness.  He indicates that he still does have sensation in the area, however it does feel blunted.  Reports that if 100% is baseline, he feels that sensation is at about 80%.  He has not had any associated pain, no associated neck pain.  Symptoms do not extend down into rest of arm.  Has not had any prior similar issues.  No concerns related to fever, chills, sweats.  Denies any weakness, no reported issues with grip strength, no problems with dropping items. On exam, patient is in no acute distress, vital signs stable.  No tenderness to palpation over spinous processes in the cervical region, no tenderness to palpation through paraspinal muscles in cervical region.  Normal strength for shoulder abduction, flexion and extension at elbow, flexion and extension at bilateral wrists.  Negative Spurling test bilaterally. Given symptoms, feel it would be reasonable to proceed with initial cervical spine evaluation. Will proceed with cervical spine X-rays at this time. Otherwise, will proceed with monitoring. Consider further evaluation or advanced imaging pending results of XR. Also, if worsening occurs, recommend returning to office for further evaluation.  Orders: -     DG Cervical Spine Complete; Future    ___________________________________________ Destinee Taber de [place], MD, ABFM, CAQSM Primary Care and  Sports Medicine [facility]

## 2023-09-14 NOTE — Assessment & Plan Note (Addendum)
Started about 1 week ago, patient noted some numbness of right arm.  He reports that this was noted initially when he was applying deodorant and under right arm he noted area of numbness.  He indicates that he still does have sensation in the area, however it does feel blunted.  Reports that if 100% is baseline, he feels that sensation is at about 80%.  He has not had any associated pain, no associated neck pain.  Symptoms do not extend down into rest of arm.  Has not had any prior similar issues.  No concerns related to fever, chills, sweats.  Denies any weakness, no reported issues with grip strength, no problems with dropping items. On exam, patient is in no acute distress, vital signs stable.  No tenderness to palpation over spinous processes in the cervical region, no tenderness to palpation through paraspinal muscles in cervical region.  Normal strength for shoulder abduction, flexion and extension at elbow, flexion and extension at bilateral wrists.  Negative Spurling test bilaterally. Given symptoms, feel it would be reasonable to proceed with initial cervical spine evaluation. Will proceed with cervical spine X-rays at this time. Otherwise, will proceed with monitoring. Consider further evaluation or advanced imaging pending results of XR. Also, if worsening occurs, recommend returning to office for further evaluation.

## 2023-09-22 ENCOUNTER — Ambulatory Visit (HOSPITAL_BASED_OUTPATIENT_CLINIC_OR_DEPARTMENT_OTHER): Payer: 59 | Admitting: Family Medicine

## 2023-11-16 ENCOUNTER — Ambulatory Visit (INDEPENDENT_AMBULATORY_CARE_PROVIDER_SITE_OTHER): Payer: 59 | Admitting: Family Medicine

## 2023-11-16 ENCOUNTER — Encounter (HOSPITAL_BASED_OUTPATIENT_CLINIC_OR_DEPARTMENT_OTHER): Payer: Self-pay | Admitting: Family Medicine

## 2023-11-16 VITALS — BP 123/92 | HR 62 | Temp 98.4°F | Ht 72.0 in | Wt 287.8 lb

## 2023-11-16 DIAGNOSIS — I10 Essential (primary) hypertension: Secondary | ICD-10-CM | POA: Diagnosis not present

## 2023-11-16 DIAGNOSIS — R200 Anesthesia of skin: Secondary | ICD-10-CM | POA: Diagnosis not present

## 2023-11-16 NOTE — Assessment & Plan Note (Signed)
Patient reports that he still has some mild numbness, however this has largely improved.  He does not have any current pain.  Denies any weakness.  He did have cervical spine x-ray completed which did reveal underlying degenerative changes, however no acute abnormality observed. Given general symptom progress, can continue with monitoring.  He does feel he has had benefit adjusting pillows and sleeping position.  Did advise on potential concerning signs and symptoms.

## 2023-11-16 NOTE — Assessment & Plan Note (Addendum)
Blood pressure borderline in office today, primarily related to diastolic reading.  Patient reports that he checks infrequently at home.  Denies any current concerns related to chest pain or headaches.  He continues with amlodipine and atenolol at this time, denies any issues with these medications. At this time, we can continue with current regimen.  Recommend intermittent monitoring of blood pressure at home, DASH diet Plan for follow-up in about 3 to 4 months for CPE, we will continue to monitor blood pressure moving forward

## 2023-11-16 NOTE — Progress Notes (Signed)
    Procedures performed today:    None.  Independent interpretation of notes and tests performed by another provider:   None.  Brief History, Exam, Impression, and Recommendations:    BP (!) 123/92 (BP Location: Left Arm, Patient Position: Sitting, Cuff Size: Large)   Pulse 62   Temp 98.4 F (36.9 C) (Oral)   Ht 6' (1.829 m)   Wt 287 lb 12.8 oz (130.5 kg)   SpO2 100%   BMI 39.03 kg/m   Essential hypertension, benign Assessment & Plan: Blood pressure borderline in office today, primarily related to diastolic reading.  Patient reports that he checks infrequently at home.  Denies any current concerns related to chest pain or headaches.  He continues with amlodipine and atenolol at this time, denies any issues with these medications. At this time, we can continue with current regimen.  Recommend intermittent monitoring of blood pressure at home, DASH diet Plan for follow-up in about 3 to 4 months for CPE, we will continue to monitor blood pressure moving forward   Arm numbness Assessment & Plan: Patient reports that he still has some mild numbness, however this has largely improved.  He does not have any current pain.  Denies any weakness.  He did have cervical spine x-ray completed which did reveal underlying degenerative changes, however no acute abnormality observed. Given general symptom progress, can continue with monitoring.  He does feel he has had benefit adjusting pillows and sleeping position.  Did advise on potential concerning signs and symptoms.   Return in about 3 months (around 02/14/2024) for CPE.  We will plan to recheck hemoglobin A1c with point-of-care A1c at time of office visit for physical   ___________________________________________ Wrenley Sayed de [place], MD, ABFM, CAQSM Primary Care and Sports Medicine [facility]

## 2024-01-22 ENCOUNTER — Other Ambulatory Visit (HOSPITAL_BASED_OUTPATIENT_CLINIC_OR_DEPARTMENT_OTHER): Payer: Self-pay | Admitting: Family Medicine

## 2024-01-22 DIAGNOSIS — I10 Essential (primary) hypertension: Secondary | ICD-10-CM

## 2024-02-14 ENCOUNTER — Encounter (HOSPITAL_BASED_OUTPATIENT_CLINIC_OR_DEPARTMENT_OTHER): Payer: Self-pay | Admitting: Family Medicine

## 2024-02-14 ENCOUNTER — Ambulatory Visit (INDEPENDENT_AMBULATORY_CARE_PROVIDER_SITE_OTHER): Payer: 59 | Admitting: Family Medicine

## 2024-02-14 VITALS — BP 130/89 | HR 58 | Ht 72.0 in | Wt 297.1 lb

## 2024-02-14 DIAGNOSIS — R7303 Prediabetes: Secondary | ICD-10-CM | POA: Insufficient documentation

## 2024-02-14 DIAGNOSIS — Z0000 Encounter for general adult medical examination without abnormal findings: Secondary | ICD-10-CM | POA: Diagnosis not present

## 2024-02-14 DIAGNOSIS — M546 Pain in thoracic spine: Secondary | ICD-10-CM

## 2024-02-14 DIAGNOSIS — G8929 Other chronic pain: Secondary | ICD-10-CM

## 2024-02-14 LAB — POCT GLYCOSYLATED HEMOGLOBIN (HGB A1C)
HbA1c POC (<> result, manual entry): 5.7 % (ref 4.0–5.6)
HbA1c, POC (controlled diabetic range): 5.7 % (ref 0.0–7.0)
HbA1c, POC (prediabetic range): 5.7 % (ref 5.7–6.4)
Hemoglobin A1C: 5.7 % — AB (ref 4.0–5.6)

## 2024-02-14 NOTE — Patient Instructions (Signed)

## 2024-02-14 NOTE — Assessment & Plan Note (Signed)
 Noted on prior labs.  Continues to work on lifestyle modifications.  We will plan to monitor A1c moving forward

## 2024-02-14 NOTE — Assessment & Plan Note (Signed)
 Routine HCM labs reviewed. HCM reviewed/discussed. Anticipatory guidance regarding healthy weight, lifestyle and choices given. Recommend healthy diet.  Recommend approximately 150 minutes/week of moderate intensity exercise Recommend regular dental and vision exams Always use seatbelt/lap and shoulder restraints Recommend using smoke alarms and checking batteries at least twice a year Recommend using sunscreen when outside Discussed tetanus immunization recommendations, patient reports being UTD Had colonoscopy completed, recommendation for 10 year recall

## 2024-02-14 NOTE — Progress Notes (Unsigned)
 Subjective:    CC: Annual Physical Exam  HPI:  Martin Rose is a 48 y.o. presenting for annual physical  I reviewed the past medical history, family history, social history, surgical history, and allergies today and no changes were needed.  Please see the problem list section below in epic for further details.  Past Medical History: Past Medical History:  Diagnosis Date   History of rectal abscess    Hypertension    Palpitations 07/11/2017   per pt only feels when he over exerts himself without sob   Perirectal fistula    wound/  left anterior   Pre-diabetes    Past Surgical History: Past Surgical History:  Procedure Laterality Date   ANAL FISTULOTOMY N/A 11/20/2018   Procedure: FISTULOTOMY placement cutting seton;  Surgeon: Andria Meuse, MD;  Location: [facility];  Service: General;  Laterality: N/A;   CARDIAC CATHETERIZATION  07-20-2006   dr Jenne Campus   abnormal cardiolite--  no sig. cad, normal LVF, ef 60%, systemic HTN, elevated LVEDP (22)   FISTULOTOMY N/A 09/11/2018   Procedure: FISTULOTOMY;  Surgeon: Andria Meuse, MD;  Location: [facility];  Service: General;  Laterality: N/A;   FISTULOTOMY N/A 02/14/2020   Procedure: INCISION AND DRAINAGE OF PERIANAL COLLECTION;  Surgeon: Andria Meuse, MD;  Location: [facility];  Service: General;  Laterality: N/A;   INCISION AND DRAINAGE PERIRECTAL ABSCESS Left 03/22/2018   Procedure: IRRIGATION AND DEBRIDEMENT PERIRECTAL ABSCESS;  Surgeon: Jimmye Norman, MD;  Location: [facility] OR;  Service: General;  Laterality: Left;   INCISION AND DRAINAGE PERIRECTAL ABSCESS N/A 07/31/2018   Procedure: IRRIGATION AND DEBRIDEMENT PERIRECTAL ABSCESS;  Surgeon: Jimmye Norman, MD;  Location: [facility] OR;  Service: General;  Laterality: N/A;   INCISION AND DRAINAGE PERIRECTAL ABSCESS Left 10/03/2022   Procedure: IRRIGATION AND DEBRIDEMENT GLUTEAL ABSCESS;  Surgeon: Fritzi Mandes, MD;  Location: [facility];  Service: General;   Laterality: Left;   PLACEMENT OF SETON N/A 09/11/2018   Procedure: PLACEMENT OF SETON;  Surgeon: Andria Meuse, MD;  Location: [facility];  Service: General;  Laterality: N/A;   PLACEMENT OF SETON N/A 11/01/2022   Procedure: PARTIAL FISTULOTOMY AND PLACEMENT OF DRAINING SETON;  Surgeon: Andria Meuse, MD;  Location: [facility];  Service: General;  Laterality: N/A;   RECTAL EXAM UNDER ANESTHESIA N/A 11/20/2018   Procedure: ANORECTAL EXAM UNDER ANESTHESIA;  Surgeon: Andria Meuse, MD;  Location: [facility];  Service: General;  Laterality: N/A;   RECTAL EXAM UNDER ANESTHESIA N/A 02/14/2020   Procedure: ANORECTAL EXAM UNDER ANESTHESIA;  Surgeon: Andria Meuse, MD;  Location: [facility];  Service: General;  Laterality: N/A;   RECTAL EXAM UNDER ANESTHESIA N/A 11/01/2022   Procedure: ANORECTAL EXAM UNDER ANESTHESIA, INTERROGATION OF PERIRECTAL WOUND;  Surgeon: Andria Meuse, MD;  Location: [facility];  Service: General;  Laterality: N/A;   TRANSTHORACIC ECHOCARDIOGRAM  11/29/2011   moderate LVH, ef 55-60%,  grade 2 diastolic dysfunction/  trivial MR and TR/  mild LAE   Social History: Social History   Socioeconomic History   Marital status: Married    Spouse name: Not on file   Number of children: 3   Years of education: Not on file   Highest education level: Not on file  Occupational History   Occupation: self empolyed  Tobacco Use   Smoking status: Former    Current packs/day: 0.00    Average packs/day: 0.5 packs/day for 15.0 years (7.5 ttl  pk-yrs)    Types: Cigarettes    Start date: 64    Quit date: 2010    Years since quitting: 15.1    Passive exposure: Past   Smokeless tobacco: Never  Vaping Use   Vaping status: Never Used  Substance and Sexual Activity   Alcohol use: Yes    Alcohol/week: 24.0 standard drinks of alcohol    Types: 24 Cans of beer per week    Comment: 2 beers a day   Drug use: Yes    Types: Marijuana     Comment: "2- times/wk"   Sexual activity: Yes  Other Topics Concern   Not on file  Social History Narrative   Not on file   Social Drivers of Health   Financial Resource Strain: Low Risk  (08/25/2023)   Overall Financial Resource Strain (CARDIA)    Difficulty of Paying Living Expenses: Not hard at all  Food Insecurity: No Food Insecurity (08/25/2023)   Hunger Vital Sign    Worried About Running Out of Food in the Last Year: Never true    Ran Out of Food in the Last Year: Never true  Transportation Needs: No Transportation Needs (08/25/2023)   PRAPARE - Administrator, Civil Service (Medical): No    Lack of Transportation (Non-Medical): No  Physical Activity: Insufficiently Active (08/25/2023)   Exercise Vital Sign    Days of Exercise per Week: 5 days    Minutes of Exercise per Session: 20 min  Stress: No Stress Concern Present (08/25/2023)   [employer] of Occupational Health - Occupational Stress Questionnaire    Feeling of Stress : Not at all  Social Connections: Moderately Isolated (08/25/2023)   Social Connection and Isolation Panel [NHANES]    Frequency of Communication with Friends and Family: More than three times a week    Frequency of Social Gatherings with Friends and Family: Twice a week    Attends Religious Services: Never    Database administrator or Organizations: No    Attends Engineer, structural: Never    Marital Status: Married   Family History: Family History  Problem Relation Age of Onset   Hypertension Mother    Hypertension Father    Cancer Sister 30       brain tumor   Stroke Sister    High blood pressure Maternal Grandmother    High blood pressure Maternal Grandfather    High blood pressure Paternal Grandmother    Colon cancer Neg Hx    Esophageal cancer Neg Hx    Liver disease Neg Hx    Rectal cancer Neg Hx    Stomach cancer Neg Hx    Allergies: No Known Allergies Medications: See med rec.  Review of Systems: No  headache, visual changes, nausea, vomiting, diarrhea, constipation, dizziness, abdominal pain, skin rash, fevers, chills, night sweats, swollen lymph nodes, weight loss, chest pain, body aches, joint swelling, muscle aches, shortness of breath, mood changes, visual or auditory hallucinations.  Objective:    BP 130/89 (BP Location: Right Arm, Patient Position: Sitting, Cuff Size: Large)   Pulse (!) 58   Ht 6' (1.829 m)   Wt 297 lb 1.6 oz (134.8 kg)   SpO2 100%   BMI 40.29 kg/m   General: Well Developed, well nourished, and in no acute distress. Neuro: Alert and oriented x3, extra-ocular muscles intact, sensation grossly intact. Cranial nerves II through XII are intact, motor, sensory, and coordinative functions are all intact. HEENT: Normocephalic, atraumatic, pupils  equal round reactive to light, neck supple, no masses, no lymphadenopathy, thyroid nonpalpable. Oropharynx, nasopharynx, external ear canals are unremarkable. Skin: Warm and dry, no rashes noted. Cardiac: Regular rate and rhythm, no murmurs rubs or gallops.  Respiratory: Clear to auscultation bilaterally. Not using accessory muscles, speaking in full sentences.  Abdominal: Soft, nontender, nondistended, positive bowel sounds, no masses, no organomegaly.  Musculoskeletal: Shoulder, elbow, wrist, hip, knee, ankle stable, and with full range of motion.  Impression and Recommendations:    Wellness examination Assessment & Plan: Routine HCM labs reviewed. HCM reviewed/discussed. Anticipatory guidance regarding healthy weight, lifestyle and choices given. Recommend healthy diet.  Recommend approximately 150 minutes/week of moderate intensity exercise Recommend regular dental and vision exams Always use seatbelt/lap and shoulder restraints Recommend using smoke alarms and checking batteries at least twice a year Recommend using sunscreen when outside Discussed tetanus immunization recommendations, patient reports being UTD Had  colonoscopy completed, recommendation for 10 year recall  Orders: -     POCT glycosylated hemoglobin (Hb A1C)  Chronic right-sided thoracic back pain Assessment & Plan: Reports any flareup in back pain, primarily noted along right side of back, thoracic region.  Similar to prior episodes of back pain.  Does not recall any injury.  No new concerning symptoms.  Denies any numbness, tingling, weakness.  No bowel or bladder concerns. Can proceed with referral to physical therapy downstairs, referral placed today.  Orders: -     Ambulatory referral to Physical Therapy  Prediabetes Assessment & Plan: Noted on prior labs.  Continues to work on lifestyle modifications.  We will plan to monitor A1c moving forward   Return in about 4 months (around 06/15/2024) for hypertension, prediabetes.   ___________________________________________ Wade Sigala de [place], MD, ABFM, CAQSM Primary Care and Sports Medicine [facility]

## 2024-02-14 NOTE — Assessment & Plan Note (Signed)
 Reports any flareup in back pain, primarily noted along right side of back, thoracic region.  Similar to prior episodes of back pain.  Does not recall any injury.  No new concerning symptoms.  Denies any numbness, tingling, weakness.  No bowel or bladder concerns. Can proceed with referral to physical therapy downstairs, referral placed today.

## 2024-02-25 ENCOUNTER — Other Ambulatory Visit (HOSPITAL_BASED_OUTPATIENT_CLINIC_OR_DEPARTMENT_OTHER): Payer: Self-pay | Admitting: Family Medicine

## 2024-02-25 DIAGNOSIS — I10 Essential (primary) hypertension: Secondary | ICD-10-CM

## 2024-03-09 ENCOUNTER — Telehealth (HOSPITAL_BASED_OUTPATIENT_CLINIC_OR_DEPARTMENT_OTHER): Payer: Self-pay | Admitting: Physical Therapy

## 2024-03-09 NOTE — Telephone Encounter (Signed)
 Called and spoke to patient to remind patient of upcoming physical therapy evaluation appointment. Pt confirmed appt and will be in attendance.

## 2024-03-13 ENCOUNTER — Ambulatory Visit (HOSPITAL_BASED_OUTPATIENT_CLINIC_OR_DEPARTMENT_OTHER): Attending: Family Medicine | Admitting: Physical Therapy

## 2024-03-13 ENCOUNTER — Encounter (HOSPITAL_BASED_OUTPATIENT_CLINIC_OR_DEPARTMENT_OTHER): Payer: Self-pay | Admitting: Physical Therapy

## 2024-03-13 ENCOUNTER — Other Ambulatory Visit: Payer: Self-pay

## 2024-03-13 DIAGNOSIS — M542 Cervicalgia: Secondary | ICD-10-CM | POA: Diagnosis present

## 2024-03-13 DIAGNOSIS — M546 Pain in thoracic spine: Secondary | ICD-10-CM | POA: Diagnosis present

## 2024-03-13 DIAGNOSIS — M5413 Radiculopathy, cervicothoracic region: Secondary | ICD-10-CM | POA: Diagnosis present

## 2024-03-13 DIAGNOSIS — G8929 Other chronic pain: Secondary | ICD-10-CM | POA: Diagnosis present

## 2024-03-13 DIAGNOSIS — R200 Anesthesia of skin: Secondary | ICD-10-CM

## 2024-03-13 NOTE — Therapy (Signed)
 OUTPATIENT PHYSICAL THERAPY THORACOLUMBAR EVALUATION   Patient Name: Martin Rose MRN: [id number] DOB:05/16/1976, 48 y.o., male Today's Date: 03/14/2024  END OF SESSION:  PT End of Session - 03/13/24 1517     Visit Number 1    Number of Visits 12    Date for PT Re-Evaluation 04/25/24    Authorization Type [employer]    PT Start Time 1300    PT Stop Time 1345    PT Time Calculation (min) 45 min    Activity Tolerance Patient tolerated treatment well;No increased pain    Behavior During Therapy WFL for tasks assessed/performed             Past Medical History:  Diagnosis Date   History of rectal abscess    Hypertension    Palpitations 07/11/2017   per pt only feels when he over exerts himself without sob   Perirectal fistula    wound/  left anterior   Pre-diabetes    Past Surgical History:  Procedure Laterality Date   ANAL FISTULOTOMY N/A 11/20/2018   Procedure: FISTULOTOMY placement cutting seton;  Surgeon: Andria Meuse, MD;  Location: [facility];  Service: General;  Laterality: N/A;   CARDIAC CATHETERIZATION  07-20-2006   dr Jenne Campus   abnormal cardiolite--  no sig. cad, normal LVF, ef 60%, systemic HTN, elevated LVEDP (22)   FISTULOTOMY N/A 09/11/2018   Procedure: FISTULOTOMY;  Surgeon: Andria Meuse, MD;  Location: [facility];  Service: General;  Laterality: N/A;   FISTULOTOMY N/A 02/14/2020   Procedure: INCISION AND DRAINAGE OF PERIANAL COLLECTION;  Surgeon: Andria Meuse, MD;  Location: [facility];  Service: General;  Laterality: N/A;   INCISION AND DRAINAGE PERIRECTAL ABSCESS Left 03/22/2018   Procedure: IRRIGATION AND DEBRIDEMENT PERIRECTAL ABSCESS;  Surgeon: Jimmye Norman, MD;  Location: [facility];  Service: General;  Laterality: Left;   INCISION AND DRAINAGE PERIRECTAL ABSCESS N/A 07/31/2018   Procedure: IRRIGATION AND DEBRIDEMENT PERIRECTAL ABSCESS;  Surgeon: Jimmye Norman, MD;  Location: [facility] OR;  Service: General;  Laterality:  N/A;   INCISION AND DRAINAGE PERIRECTAL ABSCESS Left 10/03/2022   Procedure: IRRIGATION AND DEBRIDEMENT GLUTEAL ABSCESS;  Surgeon: Fritzi Mandes, MD;  Location: [facility];  Service: General;  Laterality: Left;   PLACEMENT OF SETON N/A 09/11/2018   Procedure: PLACEMENT OF SETON;  Surgeon: Andria Meuse, MD;  Location: [facility];  Service: General;  Laterality: N/A;   PLACEMENT OF SETON N/A 11/01/2022   Procedure: PARTIAL FISTULOTOMY AND PLACEMENT OF DRAINING SETON;  Surgeon: Andria Meuse, MD;  Location: [facility];  Service: General;  Laterality: N/A;   RECTAL EXAM UNDER ANESTHESIA N/A 11/20/2018   Procedure: ANORECTAL EXAM UNDER ANESTHESIA;  Surgeon: Andria Meuse, MD;  Location: [facility];  Service: General;  Laterality: N/A;   RECTAL EXAM UNDER ANESTHESIA N/A 02/14/2020   Procedure: ANORECTAL EXAM UNDER ANESTHESIA;  Surgeon: Andria Meuse, MD;  Location: [facility];  Service: General;  Laterality: N/A;   RECTAL EXAM UNDER ANESTHESIA N/A 11/01/2022   Procedure: ANORECTAL EXAM UNDER ANESTHESIA, INTERROGATION OF PERIRECTAL WOUND;  Surgeon: Andria Meuse, MD;  Location: [facility];  Service: General;  Laterality: N/A;   TRANSTHORACIC ECHOCARDIOGRAM  11/29/2011   moderate LVH, ef 55-60%,  grade 2 diastolic dysfunction/  trivial MR and TR/  mild LAE   Patient Active Problem List   Diagnosis Date Noted   Prediabetes 02/14/2024   Arm numbness 09/14/2023   Cold extremities 08/25/2023  Right wrist pain 10/19/2022   Abscess, gluteal, left 10/03/2022   Perianal fistula 10/03/2022   Wellness examination 06/16/2022   Foot swelling 03/17/2022   Erectile dysfunction due to diseases classified elsewhere 07/30/2019   Scoliosis of thoracic spine 07/30/2019   Chronic right-sided thoracic back pain 04/13/2019   Skin rash 05/22/2018   Palpitations 07/11/2017   Alcohol use 07/11/2017   Allergic rhinitis 06/24/2015   Morbid obesity (HCC) 04/30/2013    Snoring 04/30/2013   Hidradenitis 09/04/2012   Essential hypertension, benign 03/10/2009    PCP: Dr Raymond De [place]   REFERRING PROVIDER: Dr Ceasar Mons [place]   REFERRING DIAG:  Diagnosis  M54.6,G89.29 (ICD-10-CM) - Chronic right-sided thoracic back pain    Rationale for Evaluation and Treatment: Rehabilitation  THERAPY DIAG:  Chronic right-sided thoracic back pain  Cervicalgia  Radiculopathy, cervicothoracic region  ONSET DATE:   SUBJECTIVE:                                                                                                                                                                                           SUBJECTIVE STATEMENT: Eval: Pt chief complaint is R sided thoracic and cervical pain. Has intermittent numbness that follows C7 dermatome in R arm. 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 Mainly feels like his back is stiff and has trouble moving around. Verbalizes that any twisting motion makes things worse. Says pain is a 3/10 and feels like a dull ache that gets better with massaging and heat. He reports a history of scoliosis.   PERTINENT HISTORY:  HTN, Pre-diabetes , Scoliosis   PAIN:  Are you having pain? Yes: NPRS scale: 3/10 Pain location: right side of thoracic Pain description: dull ache Aggravating factors: twisting Relieving factors: massage, heat  PRECAUTIONS: None  RED FLAGS: None   WEIGHT BEARING RESTRICTIONS: No  FALLS:  Has patient fallen in last 6 months? No  LIVING ENVIRONMENT: Stairs at home  OCCUPATION: contract for medical business  PLOF: Independent  PATIENT GOALS: get some stretches  NEXT MD VISIT:  Nothing scheduled    OBJECTIVE:  Note: Objective measures were completed at Evaluation unless otherwise noted.  DIAGNOSTIC FINDINGS:  CLINICAL DATA:  Pain numbness and tingling.   EXAM: CERVICAL SPINE - COMPLETE 5 VIEW   COMPARISON:  None Available.   FINDINGS: There is no  evidence of cervical spine fracture or prevertebral soft tissue swelling. Alignment is normal. Calcification identified anterior annuli C5-6 and C6-7. Oblique views demonstrate evidence of left-sided neural foramina encroachment especially at C5-6. The other neural foramina are not well evaluated due to positioning. Consider MRI  correlation if indicated.   IMPRESSION: Degenerative changes. Images are suboptimal for evaluation of neural foraminal narrowing due to positioning. Consider correlation with MRI. No acute osseous abnormalities.    PATIENT SURVEYS:  Modified Oswestry 12%  The patient can cope with most living activities   COGNITION: Overall cognitive status: Within functional limits for tasks assessed     SENSATION: WFL  MUSCLE LENGTH:  POSTURE: rounded shoulders, forward head, and flexed trunk   PALPATION: Tendernes/tightness to palpation bilateral thoracic paraspinals and bilateral cervical suboccipitals  LUMBAR ROM:   AROM eval  Flexion full  Extension full  Right lateral flexion   Left lateral flexion   Right rotation No pain  Left rotation No pain    (Blank rows = not tested)   CERVICAL ROM:   Active ROM A/PROM (deg) eval  Flexion No pain   Extension Reproduced pain    Right lateral flexion   Left lateral flexion   Right rotation Painful   Left rotation    (Blank rows = not tested)   LUMBAR SPECIAL TESTS:    FUNCTIONAL TESTS:    GAIT: Forward trunk lean, rounded shoulders, no decreased stance time noticed.   TREATMENT DATE:                                                                                                                               4/1 Manual: All PROM performed with distraction to reduce pain and improve movement STM to thoracic paraspinals Trigger point therapy to thoracic paraspinals Pt education on theracane and tennis ball soft tissue release There-ex: Yoga ball rollouts   Rows green RTB  3x10  Pullovers green RTB 3x10  Cervical ROTN SNAGs  Cervical EXT SNAGs    PATIENT EDUCATION:  Education details: HEP, symptom management, self care Person educated: Patient Education method: Explanation, Demonstration, Tactile cues, Verbal cues, and Handouts Education comprehension: verbalized understanding and returned demonstration  HOME EXERCISE PROGRAM: [id number]  ASSESSMENT:  CLINICAL IMPRESSION: Eval: Patient is a 48 y.o. male who was seen today for physical therapy evaluation and treatment for r sided thoracic and neck pain. Pt was assessed, AROM was observed, and STM and trigger point therapy was done. Increased muscle stiffness was noted on R side paraspinals and upper neck. Pt felt relief after STM and trigger point therapy. Pt was then instructed on self soft tissue mobilizations. 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 Therapeutic activities were done with postural and muscular activation cueing for proper body mechanics which were tolerated very well. Cervical dysfunction was addressed with SNAGs and sore but safe movements to decrease sensitization. Pt showed compliance with HEP. Next visit we can progress to gym activities and STM for symptom reduction and increasing muscular strength and endurance for postural control. Pt will continue to benefit from skilled physical therapy to decrease muscular stiffness, increase functional ROM and progress HEP  for ADL's.   OBJECTIVE IMPAIRMENTS: decreased endurance, decreased mobility, decreased ROM, decreased strength, and hypomobility.   ACTIVITY LIMITATIONS: carrying, lifting, sitting, standing, squatting, bathing, toileting, and dressing  PARTICIPATION LIMITATIONS: cleaning, laundry, driving, community activity, and occupation  PERSONAL FACTORS: 1-2 comorbidities: R wrist pain/ scoliosis   are also affecting patient's functional outcome.   REHAB POTENTIAL:  Good  CLINICAL DECISION MAKING: Stable/uncomplicated  EVALUATION COMPLEXITY: Low   GOALS: Goals reviewed with patient? Yes  SHORT TERM GOALS: Target date: 04/10/2024    Pt will progress rows and pullovers from green RTB to blue RTB without compensations. Baseline: Goal status: INITIAL  2.  Pt will demonstrate reduced muscular stiffness in thoracic and cervical spine. Baseline:  Goal status: INITIAL  3.  Pt will not have any numbness in R arm while being seen for PT.  Baseline:  Goal status: INITIAL   LONG TERM GOALS: Target date: 05/09/2024    Pt will be fully functional with self care STM with tennis ball and theracane for decreased muscular sensitivity during ADL's and work.  Baseline:  Goal status: INITIAL  2.  Pt will progress rows and pullovers to black RTB without compensations for improved functional endurance.  Baseline:  Goal status: INITIAL  3.  Pt will demonstrate symmetrical ROM and diminished pain for cervical and thoracic movements to improvement functional activity.  Baseline:  Goal status: INITIAL  4.  Pt will demonstrate full complete performance of HEP for in order to benefit from exercises post-discharge. Baseline:  Goal status: INITIAL   PLAN:  PT FREQUENCY: 1-2x/week  PT DURATION: 8 weeks  PLANNED INTERVENTIONS: 97110-Therapeutic exercises, 97530- Therapeutic activity, [id number]- Neuromuscular re-education, 97535- Self Care, [id number]- Manual therapy, [id number]- Gait training, and [phone]- Electrical stimulation (unattended). Trigger point dry needling   PLAN FOR NEXT SESSION: Progress to gym exercises. Continue STM to paraspinals and cervical spine.   Alveena Taira SPT Dessie Coma, PT 03/14/2024, 9:12 AM   I have reviewed and concur with this student's documentation.   Dessie Coma, PT 03/14/2024 12:51 PM   During this treatment session, the therapist was present, participating in and directing the treatment.

## 2024-03-14 ENCOUNTER — Encounter (HOSPITAL_BASED_OUTPATIENT_CLINIC_OR_DEPARTMENT_OTHER): Payer: Self-pay | Admitting: Physical Therapy

## 2024-03-27 ENCOUNTER — Telehealth (HOSPITAL_BASED_OUTPATIENT_CLINIC_OR_DEPARTMENT_OTHER): Payer: Self-pay

## 2024-03-27 NOTE — Telephone Encounter (Signed)
 Called pt and left VM regarding scheduling apt for tomorrow.

## 2024-04-05 ENCOUNTER — Telehealth (HOSPITAL_BASED_OUTPATIENT_CLINIC_OR_DEPARTMENT_OTHER): Payer: Self-pay | Admitting: Physical Therapy

## 2024-04-05 ENCOUNTER — Ambulatory Visit (HOSPITAL_BASED_OUTPATIENT_CLINIC_OR_DEPARTMENT_OTHER): Admitting: Physical Therapy

## 2024-04-05 NOTE — Telephone Encounter (Signed)
 Called pt in regards to missed visit. Able to leave a voicemail. This is no-show #1 at this point. Informed pt of his next visit and reiterated the attendance policy.   Fernande Treiber April Ma L Dorcus Riga, PT

## 2024-04-05 NOTE — Therapy (Deleted)
 OUTPATIENT PHYSICAL THERAPY THORACOLUMBAR EVALUATION   Patient Name: Martin Rose MRN: [id number] DOB:07/19/1976, 48 y.o., male Today's Date: 04/05/2024  END OF SESSION:    Past Medical History:  Diagnosis Date   History of rectal abscess    Hypertension    Palpitations 07/11/2017   per pt only feels when he over exerts himself without sob   Perirectal fistula    wound/  left anterior   Pre-diabetes    Past Surgical History:  Procedure Laterality Date   ANAL FISTULOTOMY N/A 11/20/2018   Procedure: FISTULOTOMY placement cutting seton;  Surgeon: Melvenia Stabs, MD;  Location: [facility];  Service: General;  Laterality: N/A;   CARDIAC CATHETERIZATION  07-20-2006   dr Silvestre Drum   abnormal cardiolite--  no sig. cad, normal LVF, ef 60%, systemic HTN, elevated LVEDP (22)   FISTULOTOMY N/A 09/11/2018   Procedure: FISTULOTOMY;  Surgeon: Melvenia Stabs, MD;  Location: [facility];  Service: General;  Laterality: N/A;   FISTULOTOMY N/A 02/14/2020   Procedure: INCISION AND DRAINAGE OF PERIANAL COLLECTION;  Surgeon: Melvenia Stabs, MD;  Location: [facility];  Service: General;  Laterality: N/A;   INCISION AND DRAINAGE PERIRECTAL ABSCESS Left 03/22/2018   Procedure: IRRIGATION AND DEBRIDEMENT PERIRECTAL ABSCESS;  Surgeon: Jerryl Morin, MD;  Location: [facility];  Service: General;  Laterality: Left;   INCISION AND DRAINAGE PERIRECTAL ABSCESS N/A 07/31/2018   Procedure: IRRIGATION AND DEBRIDEMENT PERIRECTAL ABSCESS;  Surgeon: Jerryl Morin, MD;  Location: [facility];  Service: General;  Laterality: N/A;   INCISION AND DRAINAGE PERIRECTAL ABSCESS Left 10/03/2022   Procedure: IRRIGATION AND DEBRIDEMENT GLUTEAL ABSCESS;  Surgeon: Lujean Sake, MD;  Location: [facility];  Service: General;  Laterality: Left;   PLACEMENT OF SETON N/A 09/11/2018   Procedure: PLACEMENT OF SETON;  Surgeon: Melvenia Stabs, MD;  Location: [facility];  Service: General;   Laterality: N/A;   PLACEMENT OF SETON N/A 11/01/2022   Procedure: PARTIAL FISTULOTOMY AND PLACEMENT OF DRAINING SETON;  Surgeon: Melvenia Stabs, MD;  Location: [facility];  Service: General;  Laterality: N/A;   RECTAL EXAM UNDER ANESTHESIA N/A 11/20/2018   Procedure: ANORECTAL EXAM UNDER ANESTHESIA;  Surgeon: Melvenia Stabs, MD;  Location: [facility];  Service: General;  Laterality: N/A;   RECTAL EXAM UNDER ANESTHESIA N/A 02/14/2020   Procedure: ANORECTAL EXAM UNDER ANESTHESIA;  Surgeon: Melvenia Stabs, MD;  Location: [facility];  Service: General;  Laterality: N/A;   RECTAL EXAM UNDER ANESTHESIA N/A 11/01/2022   Procedure: ANORECTAL EXAM UNDER ANESTHESIA, INTERROGATION OF PERIRECTAL WOUND;  Surgeon: Melvenia Stabs, MD;  Location: [facility];  Service: General;  Laterality: N/A;   TRANSTHORACIC ECHOCARDIOGRAM  11/29/2011   moderate LVH, ef 55-60%,  grade 2 diastolic dysfunction/  trivial MR and TR/  mild LAE   Patient Active Problem List   Diagnosis Date Noted   Prediabetes 02/14/2024   Arm numbness 09/14/2023   Cold extremities 08/25/2023   Right wrist pain 10/19/2022   Abscess, gluteal, left 10/03/2022   Perianal fistula 10/03/2022   Wellness examination 06/16/2022   Foot swelling 03/17/2022   Erectile dysfunction due to diseases classified elsewhere 07/30/2019   Scoliosis of thoracic spine 07/30/2019   Chronic right-sided thoracic back pain 04/13/2019   Skin rash 05/22/2018   Palpitations 07/11/2017   Alcohol use 07/11/2017   Allergic rhinitis 06/24/2015   Morbid obesity (HCC) 04/30/2013   Snoring 04/30/2013   Hidradenitis 09/04/2012   Essential hypertension,  benign 03/10/2009    PCP: Dr Raymond De Peru   REFERRING PROVIDER: Dr Court Distance Peru   REFERRING DIAG:  Diagnosis  M54.6,G89.29 (ICD-10-CM) - Chronic right-sided thoracic back pain    Rationale for Evaluation and Treatment: Rehabilitation  THERAPY DIAG:  No diagnosis found.  ONSET  DATE:   SUBJECTIVE:                                                                                                                                                                                           SUBJECTIVE STATEMENT: ***  Eval: Pt chief complaint is R sided thoracic and cervical pain. Has intermittent numbness that follows C7 dermatome in R arm. 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 Mainly feels like his back is stiff and has trouble moving around. Verbalizes that any twisting motion makes things worse. Says pain is a 3/10 and feels like a dull ache that gets better with massaging and heat. He reports a history of scoliosis.   PERTINENT HISTORY:  HTN, Pre-diabetes , Scoliosis   PAIN:  Are you having pain? Yes: NPRS scale: 3/10 Pain location: right side of thoracic Pain description: dull ache Aggravating factors: twisting Relieving factors: massage, heat  PRECAUTIONS: None  RED FLAGS: None   WEIGHT BEARING RESTRICTIONS: No  FALLS:  Has patient fallen in last 6 months? No  LIVING ENVIRONMENT: Stairs at home  OCCUPATION: contract for medical business  PLOF: Independent  PATIENT GOALS: get some stretches  NEXT MD VISIT:  Nothing scheduled    OBJECTIVE:  Note: Objective measures were completed at Evaluation unless otherwise noted.  DIAGNOSTIC FINDINGS:  CLINICAL DATA:  Pain numbness and tingling.   EXAM: CERVICAL SPINE - COMPLETE 5 VIEW   COMPARISON:  None Available.   FINDINGS: There is no evidence of cervical spine fracture or prevertebral soft tissue swelling. Alignment is normal. Calcification identified anterior annuli C5-6 and C6-7. Oblique views demonstrate evidence of left-sided neural foramina encroachment especially at C5-6. The other neural foramina are not well evaluated due to positioning. Consider MRI correlation if indicated.   IMPRESSION: Degenerative changes. Images are suboptimal for evaluation of  neural foraminal narrowing due to positioning. Consider correlation with MRI. No acute osseous abnormalities.    PATIENT SURVEYS:  Modified Oswestry 12%  The patient can cope with most living activities   COGNITION: Overall cognitive status: Within functional limits for tasks assessed     SENSATION: WFL  MUSCLE LENGTH:  POSTURE: rounded shoulders, forward head, and flexed trunk   PALPATION: Tendernes/tightness to palpation bilateral thoracic paraspinals and bilateral cervical suboccipitals  LUMBAR ROM:  AROM eval  Flexion full  Extension full  Right lateral flexion   Left lateral flexion   Right rotation No pain  Left rotation No pain    (Blank rows = not tested)   CERVICAL ROM:   Active ROM A/PROM (deg) eval  Flexion No pain   Extension Reproduced pain    Right lateral flexion   Left lateral flexion   Right rotation Painful   Left rotation    (Blank rows = not tested)   LUMBAR SPECIAL TESTS:    FUNCTIONAL TESTS:    GAIT: Forward trunk lean, rounded shoulders, no decreased stance time noticed.   TREATMENT DATE:                                                                                                                               4/24  ***  4/1 Manual: All PROM performed with distraction to reduce pain and improve movement STM to thoracic paraspinals Trigger point therapy to thoracic paraspinals Pt education on theracane and tennis ball soft tissue release There-ex: Yoga ball rollouts   Rows green RTB 3x10  Pullovers green RTB 3x10  Cervical ROTN SNAGs  Cervical EXT SNAGs    PATIENT EDUCATION:  Education details: HEP, symptom management, self care Person educated: Patient Education method: Explanation, Demonstration, Tactile cues, Verbal cues, and Handouts Education comprehension: verbalized understanding and returned demonstration  HOME EXERCISE PROGRAM: [id number]  ASSESSMENT:  CLINICAL  IMPRESSION: ***  Eval: Patient is a 48 y.o. male who was seen today for physical therapy evaluation and treatment for r sided thoracic and neck pain. Pt was assessed, AROM was observed, and STM and trigger point therapy was done. Increased muscle stiffness was noted on R side paraspinals and upper neck. Pt felt relief after STM and trigger point therapy. Pt was then instructed on self soft tissue mobilizations. 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 Therapeutic activities were done with postural and muscular activation cueing for proper body mechanics which were tolerated very well. Cervical dysfunction was addressed with SNAGs and sore but safe movements to decrease sensitization. Pt showed compliance with HEP. Next visit we can progress to gym activities and STM for symptom reduction and increasing muscular strength and endurance for postural control. Pt will continue to benefit from skilled physical therapy to decrease muscular stiffness, increase functional ROM and progress HEP for ADL's.   OBJECTIVE IMPAIRMENTS: decreased endurance, decreased mobility, decreased ROM, decreased strength, and hypomobility.    GOALS: Goals reviewed with patient? Yes  SHORT TERM GOALS: Target date: 04/10/2024    Pt will progress rows and pullovers from green RTB to blue RTB without compensations. Baseline: Goal status: INITIAL  2.  Pt will demonstrate reduced muscular stiffness in thoracic and cervical spine. Baseline:  Goal status: INITIAL  3.  Pt will not have any numbness in R arm while being seen for  PT.  Baseline:  Goal status: INITIAL   LONG TERM GOALS: Target date: 05/09/2024    Pt will be fully functional with self care STM with tennis ball and theracane for decreased muscular sensitivity during ADL's and work.  Baseline:  Goal status: INITIAL  2.  Pt will progress rows and pullovers to black RTB without compensations for  improved functional endurance.  Baseline:  Goal status: INITIAL  3.  Pt will demonstrate symmetrical ROM and diminished pain for cervical and thoracic movements to improvement functional activity.  Baseline:  Goal status: INITIAL  4.  Pt will demonstrate full complete performance of HEP for in order to benefit from exercises post-discharge. Baseline:  Goal status: INITIAL   PLAN:  PT FREQUENCY: 1-2x/week  PT DURATION: 8 weeks  PLANNED INTERVENTIONS: 97110-Therapeutic exercises, 97530- Therapeutic activity, [id number]- Neuromuscular re-education, 97535- Self Care, [id number]- Manual therapy, [id number]- Gait training, and [phone]- Electrical stimulation (unattended). Trigger point dry needling   PLAN FOR NEXT SESSION: Progress to gym exercises. Continue STM to paraspinals and cervical spine.    Donneisha Beane April Ma L Sharma Lawrance, PT 04/05/2024 9:18 AM

## 2024-04-10 ENCOUNTER — Ambulatory Visit (HOSPITAL_BASED_OUTPATIENT_CLINIC_OR_DEPARTMENT_OTHER): Admitting: Physical Therapy

## 2024-04-17 ENCOUNTER — Ambulatory Visit (HOSPITAL_BASED_OUTPATIENT_CLINIC_OR_DEPARTMENT_OTHER): Attending: Family Medicine | Admitting: Physical Therapy

## 2024-04-17 ENCOUNTER — Telehealth (HOSPITAL_BASED_OUTPATIENT_CLINIC_OR_DEPARTMENT_OTHER): Payer: Self-pay | Admitting: Physical Therapy

## 2024-04-17 DIAGNOSIS — M5413 Radiculopathy, cervicothoracic region: Secondary | ICD-10-CM | POA: Insufficient documentation

## 2024-04-17 DIAGNOSIS — M542 Cervicalgia: Secondary | ICD-10-CM | POA: Insufficient documentation

## 2024-04-17 DIAGNOSIS — M546 Pain in thoracic spine: Secondary | ICD-10-CM | POA: Insufficient documentation

## 2024-04-17 DIAGNOSIS — G8929 Other chronic pain: Secondary | ICD-10-CM | POA: Insufficient documentation

## 2024-04-17 NOTE — Telephone Encounter (Signed)
 Patient contacted 2nd to 3rd straight No-show appointment. Therapy left message. Patient advised he is D/C'd 2nd to our attendance policy. He was advised he will need a new script to return to therapy.

## 2024-04-24 ENCOUNTER — Encounter (HOSPITAL_BASED_OUTPATIENT_CLINIC_OR_DEPARTMENT_OTHER): Admitting: Physical Therapy

## 2024-05-03 ENCOUNTER — Encounter: Payer: Self-pay | Admitting: Podiatry

## 2024-05-03 ENCOUNTER — Ambulatory Visit (INDEPENDENT_AMBULATORY_CARE_PROVIDER_SITE_OTHER)

## 2024-05-03 ENCOUNTER — Ambulatory Visit (INDEPENDENT_AMBULATORY_CARE_PROVIDER_SITE_OTHER): Admitting: Podiatry

## 2024-05-03 DIAGNOSIS — M7989 Other specified soft tissue disorders: Secondary | ICD-10-CM | POA: Diagnosis not present

## 2024-05-03 DIAGNOSIS — M6740 Ganglion, unspecified site: Secondary | ICD-10-CM | POA: Diagnosis not present

## 2024-05-04 NOTE — Progress Notes (Signed)
 Subjective:   Patient ID: Martin Rose, male   DOB: 48 y.o.   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

## 2024-06-01 ENCOUNTER — Encounter (HOSPITAL_BASED_OUTPATIENT_CLINIC_OR_DEPARTMENT_OTHER): Payer: Self-pay | Admitting: *Deleted

## 2024-06-04 ENCOUNTER — Ambulatory Visit (HOSPITAL_BASED_OUTPATIENT_CLINIC_OR_DEPARTMENT_OTHER): Admitting: Family Medicine

## 2024-06-06 ENCOUNTER — Ambulatory Visit (HOSPITAL_BASED_OUTPATIENT_CLINIC_OR_DEPARTMENT_OTHER): Admitting: Family Medicine

## 2024-06-18 ENCOUNTER — Encounter (HOSPITAL_BASED_OUTPATIENT_CLINIC_OR_DEPARTMENT_OTHER): Payer: Self-pay | Admitting: Family Medicine

## 2024-06-18 ENCOUNTER — Ambulatory Visit (INDEPENDENT_AMBULATORY_CARE_PROVIDER_SITE_OTHER): Admitting: Family Medicine

## 2024-06-18 VITALS — BP 132/88 | HR 59 | Ht 72.0 in | Wt 302.4 lb

## 2024-06-18 DIAGNOSIS — I10 Essential (primary) hypertension: Secondary | ICD-10-CM | POA: Diagnosis not present

## 2024-06-18 DIAGNOSIS — G8929 Other chronic pain: Secondary | ICD-10-CM

## 2024-06-18 DIAGNOSIS — M546 Pain in thoracic spine: Secondary | ICD-10-CM

## 2024-06-18 NOTE — Patient Instructions (Signed)

## 2024-06-18 NOTE — Assessment & Plan Note (Signed)
 Blood pressure borderline in office today.  Patient reports that he checks infrequently at home.  Denies any current concerns related to chest pain or headaches.  He continues with amlodipine  and atenolol  at this time, denies any issues with these medications. At this time, we can continue with current regimen.  Recommend intermittent monitoring of blood pressure at home, DASH diet Plan for follow-up in about 3 to 4 months, we will continue to monitor blood pressure moving forward

## 2024-06-18 NOTE — Progress Notes (Signed)
    Procedures performed today:    None.  Independent interpretation of notes and tests performed by another provider:   None.  Brief History, Exam, Impression, and Recommendations:    BP 132/88 (BP Location: Right Arm, Patient Position: Sitting, Cuff Size: Large)   Pulse (!) 59   Ht 6' (1.829 m)   Wt (!) 302 lb 6.4 oz (137.2 kg)   SpO2 100%   BMI 41.01 kg/m   Essential hypertension, benign Assessment & Plan: Blood pressure borderline in office today.  Patient reports that he checks infrequently at home.  Denies any current concerns related to chest pain or headaches.  He continues with amlodipine  and atenolol  at this time, denies any issues with these medications. At this time, we can continue with current regimen.  Recommend intermittent monitoring of blood pressure at home, DASH diet Plan for follow-up in about 3 to 4 months, we will continue to monitor blood pressure moving forward   Chronic right-sided thoracic back pain Assessment & Plan: Continues to have right shoulder/thoracic back pain. Did have initial evaluation with PT, but has not had further evaluation yet, does have appointment later today. Recommend continuing with PT, home exercise program as per PT.   Return in about 4 months (around 10/19/2024) for hypertension.   ___________________________________________ Martin Guaman de [place], MD, ABFM, CAQSM Primary Care and Sports Medicine [facility]

## 2024-06-18 NOTE — Assessment & Plan Note (Signed)
 Continues to have right shoulder/thoracic back pain. Did have initial evaluation with PT, but has not had further evaluation yet, does have appointment later today. Recommend continuing with PT, home exercise program as per PT.

## 2024-06-19 ENCOUNTER — Telehealth (HOSPITAL_BASED_OUTPATIENT_CLINIC_OR_DEPARTMENT_OTHER): Payer: Self-pay | Admitting: *Deleted

## 2024-06-19 NOTE — Telephone Encounter (Signed)
 Ok to place new referral

## 2024-06-19 NOTE — Telephone Encounter (Signed)
 Copied from CRM [id number]. Topic: General - Other >> Jun 19, 2024  4:24 PM Selinda RAMAN wrote: Reason for CRM: Karlyn the spouse of the patient called stating her husband went downstairs to the PT department after his appointment with his provider and they told him they will need a new prescription as his ran out since it has been a couple of months ago since his last session. Please send a new prescription to restart PT for his back and let the patient know when it has been sent. Please call [phone] [facility] when sent.

## 2024-06-22 ENCOUNTER — Other Ambulatory Visit (HOSPITAL_BASED_OUTPATIENT_CLINIC_OR_DEPARTMENT_OTHER): Payer: Self-pay | Admitting: *Deleted

## 2024-06-22 DIAGNOSIS — G8929 Other chronic pain: Secondary | ICD-10-CM

## 2024-06-22 DIAGNOSIS — M419 Scoliosis, unspecified: Secondary | ICD-10-CM

## 2024-06-22 NOTE — Telephone Encounter (Signed)
 [place] notified referral was placed

## 2024-07-19 ENCOUNTER — Telehealth (HOSPITAL_BASED_OUTPATIENT_CLINIC_OR_DEPARTMENT_OTHER): Payer: Self-pay | Admitting: *Deleted

## 2024-07-19 ENCOUNTER — Other Ambulatory Visit (HOSPITAL_BASED_OUTPATIENT_CLINIC_OR_DEPARTMENT_OTHER): Payer: Self-pay | Admitting: Family Medicine

## 2024-07-19 DIAGNOSIS — I10 Essential (primary) hypertension: Secondary | ICD-10-CM

## 2024-07-19 NOTE — Telephone Encounter (Signed)
This medication has been sent to the pharmacy  ?

## 2024-07-19 NOTE — Telephone Encounter (Signed)
 Copied from CRM #[id number]. Topic: Clinical - Medication Question >> Jul 19, 2024  3:24 PM Chiquita SQUIBB wrote: Reason for CRM: Patients wife is calling in stating that the patient is completely out of the atenolol  (TENORMIN ) 50 MG tablet [[id number]], she is requesting this to be filled today as soon as possible. Please advise patients wife.

## 2024-09-18 ENCOUNTER — Ambulatory Visit (INDEPENDENT_AMBULATORY_CARE_PROVIDER_SITE_OTHER): Admitting: Family Medicine

## 2024-09-18 ENCOUNTER — Encounter (HOSPITAL_BASED_OUTPATIENT_CLINIC_OR_DEPARTMENT_OTHER): Payer: Self-pay

## 2024-09-18 VITALS — Temp 98.6°F

## 2024-09-18 DIAGNOSIS — Z23 Encounter for immunization: Secondary | ICD-10-CM

## 2024-09-18 NOTE — Progress Notes (Signed)
 Patient is in office today for a nurse visit for Immunization. Patient Injection was given in the  Left deltoid. Patient tolerated injection well.

## 2024-10-01 ENCOUNTER — Other Ambulatory Visit (HOSPITAL_BASED_OUTPATIENT_CLINIC_OR_DEPARTMENT_OTHER): Payer: Self-pay | Admitting: Family Medicine

## 2024-10-01 DIAGNOSIS — I10 Essential (primary) hypertension: Secondary | ICD-10-CM

## 2024-10-03 ENCOUNTER — Telehealth (HOSPITAL_BASED_OUTPATIENT_CLINIC_OR_DEPARTMENT_OTHER): Payer: Self-pay | Admitting: Family Medicine

## 2024-10-03 NOTE — Telephone Encounter (Signed)
 Patient's wife wanted to know if another referral could be sent to rehabilitation at [facility] and contact her donnal thorton) at [phone] about referral info please advise

## 2024-10-12 NOTE — Telephone Encounter (Signed)
 Attempted to contact patient but unfortunately was sent to voicemail. I have left a voice message requesting a call back to our office; I also stated office hours.

## 2024-10-23 ENCOUNTER — Ambulatory Visit (HOSPITAL_BASED_OUTPATIENT_CLINIC_OR_DEPARTMENT_OTHER): Admitting: Family Medicine

## 2024-11-24 ENCOUNTER — Encounter (HOSPITAL_BASED_OUTPATIENT_CLINIC_OR_DEPARTMENT_OTHER): Payer: Self-pay | Admitting: Emergency Medicine

## 2024-11-24 ENCOUNTER — Emergency Department (HOSPITAL_BASED_OUTPATIENT_CLINIC_OR_DEPARTMENT_OTHER)
Admission: EM | Admit: 2024-11-24 | Discharge: 2024-11-24 | Disposition: A | Discharge status: Home / Self Care | Attending: Emergency Medicine | Admitting: Emergency Medicine

## 2024-11-24 ENCOUNTER — Other Ambulatory Visit: Payer: Self-pay

## 2024-11-24 DIAGNOSIS — L03317 Cellulitis of buttock: Secondary | ICD-10-CM | POA: Insufficient documentation

## 2024-11-24 DIAGNOSIS — L0231 Cutaneous abscess of buttock: Secondary | ICD-10-CM | POA: Insufficient documentation

## 2024-11-24 MED ORDER — DOXYCYCLINE HYCLATE 100 MG PO CAPS: 100.0000 mg | ORAL_CAPSULE | Freq: Two times a day (BID) | ORAL | 0 refills | Status: AC

## 2024-11-24 MED ORDER — MORPHINE SULFATE 15 MG PO TABS: 7.5000 mg | ORAL_TABLET | ORAL | 0 refills | Status: AC | PRN

## 2024-11-24 MED ORDER — LIDOCAINE-EPINEPHRINE (PF) 2 %-1:200000 IJ SOLN
20.0000 mL | INTRAMUSCULAR | Status: DC
  Administered 2024-11-24: 20 mL

## 2024-11-24 MED ORDER — DOXYCYCLINE HYCLATE 100 MG PO TABS
100.0000 mg | ORAL_TABLET | Freq: Once | ORAL | Status: AC
  Administered 2024-11-24: 100 mg via ORAL
  Filled 2024-11-24: qty 1

## 2024-11-24 MED ORDER — LIDOCAINE-EPINEPHRINE (PF) 2 %-1:200000 IJ SOLN
10.0000 mL | Freq: Once | INTRAMUSCULAR | Status: AC
  Administered 2024-11-24: 10 mL via INTRADERMAL
  Filled 2024-11-24: qty 20

## 2024-11-24 MED ORDER — KETOROLAC TROMETHAMINE 15 MG/ML IJ SOLN
15.0000 mg | Freq: Once | INTRAMUSCULAR | Status: AC
  Administered 2024-11-24: 15 mg via INTRAMUSCULAR

## 2024-11-24 MED ORDER — ACETAMINOPHEN 500 MG PO TABS
1000.0000 mg | ORAL_TABLET | Freq: Once | ORAL | Status: AC
  Administered 2024-11-24: 1000 mg via ORAL
  Filled 2024-11-24: qty 2

## 2024-11-24 MED ORDER — OXYCODONE HCL 5 MG PO TABS
5.0000 mg | ORAL_TABLET | Freq: Once | ORAL | Status: AC
  Administered 2024-11-24: 5 mg via ORAL
  Filled 2024-11-24: qty 1

## 2024-11-24 MED ORDER — KETOROLAC TROMETHAMINE 15 MG/ML IJ SOLN
15.0000 mg | Freq: Once | INTRAMUSCULAR | Status: DC
  Filled 2024-11-24: qty 1

## 2024-11-24 NOTE — Discharge Instructions (Addendum)
 Please follow-up with your general surgeon in the office.  Please return for rapid spreading redness or if you develop a fever.  Take the antibiotics as prescribed.  Warm compresses or soaks at least 4 times a day  Take 4 over the counter ibuprofen  tablets 3 times a day or 2 over-the-counter naproxen tablets twice a day for pain. Also take tylenol  1000mg (2 extra strength) four times a day.   Then take the pain medicine if you feel like you need it. Narcotics do not help with the pain, they only make you care about it less.  You can become addicted to this, people may break into your house to steal it.  It will constipate you.  If you drive under the influence of this medicine you can get a DUI.

## 2024-11-24 NOTE — ED Triage Notes (Signed)
°  Patient comes in with abscess on left buttock.  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  Patient states he noticed it about 3 days ago.  Started running a fever last night.  Denies any wound drainage.  Took tylenol  1000 mg around 0200.  Pain 10/10, sharp/pressure.

## 2024-11-24 NOTE — ED Provider Notes (Signed)
 [place] EMERGENCY DEPARTMENT AT [facility] Provider Note   CSN: [id number] Arrival date & time: 11/24/24  [id number]     Patient presents with: Abscess   Martin Rose is a 48 y.o. male.   48 yo M with a chief complaints of abscess to his buttocks.  Going on for about 3 days now.  Has had recurrent issues with this.  Has had a perirectal abscess at some point that required surgical intervention.  He does not think it extends to his rectum.  Developed fevers last night.  Has tried sitz bath's but without improvement.   Abscess      Prior to Admission medications  Medication Sig Start Date End Date Taking? Authorizing Provider  doxycycline  (VIBRAMYCIN ) 100 MG capsule Take 1 capsule (100 mg total) by mouth 2 (two) times daily. One po bid x 7 days 11/24/24  Yes Emil Share, DO  morphine  (MSIR) 15 MG tablet Take 0.5 tablets (7.5 mg total) by mouth every 4 (four) hours as needed for severe pain (pain score 7-10). 11/24/24  Yes Emil Share, DO  amLODipine  (NORVASC ) 10 MG tablet TAKE 1 TABLET BY MOUTH EVERY DAY 10/01/24   de [place], Raymond J, MD  atenolol  (TENORMIN ) 50 MG tablet TAKE 1 TABLET BY MOUTH EVERY DAY 07/19/24   de [place], Quintin PARAS, MD    Allergies: Patient has no known allergies.    Review of Systems  Updated Vital Signs BP 139/85 (BP Location: Right Arm)   Pulse 80   Temp 98 F (36.7 C) (Oral)   Resp 20   Ht 6' (1.829 m)   Wt 127.9 kg   SpO2 100%   BMI 38.25 kg/m   Physical Exam Vitals and nursing note reviewed.  Constitutional:      Appearance: He is well-developed.  HENT:     Head: Normocephalic and atraumatic.  Eyes:     Pupils: Pupils are equal, round, and reactive to light.  Neck:     Vascular: No JVD.  Cardiovascular:     Rate and Rhythm: Normal rate and regular rhythm.     Heart sounds: No murmur heard.    No friction rub. No gallop.  Pulmonary:     Effort: No respiratory distress.     Breath sounds: No wheezing.  Abdominal:     General:  There is no distension.     Tenderness: There is no abdominal tenderness. There is no guarding or rebound.  Musculoskeletal:        General: Normal range of motion.     Cervical back: Normal range of motion and neck supple.     Comments: Left buttock with edema erythema warmth and fluctuance.  No obvious extension to the rectum.  Skin:    Coloration: Skin is not pale.     Findings: No rash.  Neurological:     Mental Status: He is alert and oriented to person, place, and time.  Psychiatric:        Behavior: Behavior normal.     (all labs ordered are listed, but only abnormal results are displayed) Labs Reviewed - No data to display  EKG: None  Radiology: No results found.   .Incision and Drainage  Date/Time: 11/24/2024 8:18 AM  Performed by: Emil Share, DO Authorized by: Emil Share, DO   Consent:    Consent obtained:  Verbal   Consent given by:  Patient   Risks, benefits, and alternatives were discussed: yes     Risks discussed:  Bleeding, incomplete  drainage and infection   Alternatives discussed:  No treatment, delayed treatment and alternative treatment Universal protocol:    Procedure explained and questions answered to patient or proxy's satisfaction: yes     Patient identity confirmed:  Verbally with patient Location:    Type:  Abscess   Location:  Anogenital   Anogenital location: buttock. Pre-procedure details:    Skin preparation:  Chlorhexidine  Sedation:    Sedation type:  None Anesthesia:    Anesthesia method:  Local infiltration   Local anesthetic:  20 mL lidocaine -EPINEPHrine  2 %-1:200000 Procedure type:    Complexity:  Complex Procedure details:    Ultrasound guidance: no     Needle aspiration: no     Incision types:  Single straight   Incision depth:  Subcutaneous   Wound management:  Probed and deloculated   Drainage:  Bloody and purulent   Drainage amount:  Copious   Wound treatment:  Wound left open   Packing materials:   None Post-procedure details:    Procedure completion:  Tolerated well, no immediate complications Comments:     I am able to get almost the entire hemostat into the patient's buttock.  Loculations broken up as best as possible.    Medications Ordered in the ED  doxycycline  (VIBRA -TABS) tablet 100 mg (has no administration in time range)  lidocaine -EPINEPHrine  (XYLOCAINE  W/EPI) 2 %-1:200000 (PF) injection 10 mL (10 mLs Intradermal Given 11/24/24 0748)  acetaminophen  (TYLENOL ) tablet 1,000 mg (1,000 mg Oral Given 11/24/24 0745)  oxyCODONE  (Oxy IR/ROXICODONE ) immediate release tablet 5 mg (5 mg Oral Given 11/24/24 0745)  ketorolac  (TORADOL ) 15 MG/ML injection 15 mg (15 mg Intramuscular Given 11/24/24 0745)                                    Medical Decision Making Risk OTC drugs. Prescription drug management.   48 yo M with a chief complaints of abscess on his buttocks.  Going on for a few days.  Will drain at bedside.  Patient tells me he has follow-up this week with general surgery.  8:20 AM:  I have discussed the diagnosis/risks/treatment options with the patient.  Evaluation and diagnostic testing in the emergency department does not suggest an emergent condition requiring admission or immediate intervention beyond what has been performed at this time.  They will follow up with Gen surgery. We also discussed returning to the ED immediately if new or worsening sx occur. We discussed the sx which are most concerning (e.g., sudden worsening pain, fever, inability to tolerate by mouth) that necessitate immediate return. Medications administered to the patient during their visit and any new prescriptions provided to the patient are listed below.  Medications given during this visit Medications  doxycycline  (VIBRA -TABS) tablet 100 mg (has no administration in time range)  lidocaine -EPINEPHrine  (XYLOCAINE  W/EPI) 2 %-1:200000 (PF) injection 10 mL (10 mLs Intradermal Given 11/24/24 0748)   acetaminophen  (TYLENOL ) tablet 1,000 mg (1,000 mg Oral Given 11/24/24 0745)  oxyCODONE  (Oxy IR/ROXICODONE ) immediate release tablet 5 mg (5 mg Oral Given 11/24/24 0745)  ketorolac  (TORADOL ) 15 MG/ML injection 15 mg (15 mg Intramuscular Given 11/24/24 0745)     The patient appears reasonably screen and/or stabilized for discharge and I doubt any other medical condition or other [facility] requiring further screening, evaluation, or treatment in the ED at this time prior to discharge.       Final diagnoses:  Abscess and cellulitis of gluteal  region    ED Discharge Orders          Ordered    doxycycline  (VIBRAMYCIN ) 100 MG capsule  2 times daily        11/24/24 0817    morphine  (MSIR) 15 MG tablet  Every 4 hours PRN        11/24/24 0818               Mahealani Sulak, DO 11/24/24 0820

## 2025-01-08 ENCOUNTER — Ambulatory Visit (HOSPITAL_BASED_OUTPATIENT_CLINIC_OR_DEPARTMENT_OTHER): Admitting: Family Medicine

## 2025-01-29 ENCOUNTER — Ambulatory Visit (HOSPITAL_BASED_OUTPATIENT_CLINIC_OR_DEPARTMENT_OTHER): Admitting: Family Medicine
